# Patient Record
Sex: Female | Born: 1950 | Race: White | Hispanic: No | Smoking: Never smoker
Health system: Southern US, Community
[De-identification: ages and names within clinical notes are randomized; demographics above are authoritative.]

## PROBLEM LIST (undated history)

## (undated) DIAGNOSIS — G250 Essential tremor: Secondary | ICD-10-CM

## (undated) DIAGNOSIS — R251 Tremor, unspecified: Secondary | ICD-10-CM

## (undated) DIAGNOSIS — K921 Melena: Secondary | ICD-10-CM

## (undated) DIAGNOSIS — Z860100 Personal history of colon polyps, unspecified: Secondary | ICD-10-CM

## (undated) DIAGNOSIS — R079 Chest pain, unspecified: Secondary | ICD-10-CM

## (undated) DIAGNOSIS — D168 Benign neoplasm of pelvic bones, sacrum and coccyx: Secondary | ICD-10-CM

## (undated) DIAGNOSIS — E1140 Type 2 diabetes mellitus with diabetic neuropathy, unspecified: Secondary | ICD-10-CM

## (undated) DIAGNOSIS — K219 Gastro-esophageal reflux disease without esophagitis: Secondary | ICD-10-CM

## (undated) DIAGNOSIS — E785 Hyperlipidemia, unspecified: Secondary | ICD-10-CM

## (undated) DIAGNOSIS — G4730 Sleep apnea, unspecified: Secondary | ICD-10-CM

## (undated) DIAGNOSIS — I10 Essential (primary) hypertension: Secondary | ICD-10-CM

## (undated) DIAGNOSIS — T7840XA Allergy, unspecified, initial encounter: Secondary | ICD-10-CM

## (undated) DIAGNOSIS — E669 Obesity, unspecified: Secondary | ICD-10-CM

## (undated) HISTORY — DX: Personal history of colonic polyps: Z86.010

## (undated) HISTORY — DX: Morbid (severe) obesity due to excess calories: E66.01

## (undated) HISTORY — DX: Chest pain, unspecified: R07.9

## (undated) HISTORY — DX: Allergy, unspecified, initial encounter: T78.40XA

## (undated) HISTORY — DX: Tremor, unspecified: R25.1

## (undated) HISTORY — DX: Melena: K92.1

## (undated) HISTORY — DX: Benign neoplasm of pelvic bones, sacrum and coccyx: D16.8

## (undated) HISTORY — DX: Essential (primary) hypertension: I10

## (undated) HISTORY — DX: Sleep apnea, unspecified: G47.30

## (undated) HISTORY — DX: Hyperlipidemia, unspecified: E78.5

## (undated) HISTORY — DX: Gastro-esophageal reflux disease without esophagitis: K21.9

## (undated) HISTORY — DX: Personal history of colon polyps, unspecified: Z86.0100

---
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
## 1986-04-18 HISTORY — PX: OVARY BIOPSY: SHX2143

## 1986-04-18 HISTORY — PX: OTHER SURGICAL HISTORY: SHX169

## 1986-04-18 HISTORY — PX: ABDOMINAL HYSTERECTOMY: SHX81

## 2007-04-19 LAB — HM COLONOSCOPY

## 2009-02-06 ENCOUNTER — Ambulatory Visit: Payer: Self-pay | Admitting: Internal Medicine

## 2010-07-27 ENCOUNTER — Encounter: Payer: Self-pay | Admitting: Family Medicine

## 2010-07-27 ENCOUNTER — Ambulatory Visit (INDEPENDENT_AMBULATORY_CARE_PROVIDER_SITE_OTHER): Payer: 59 | Admitting: Family Medicine

## 2010-07-27 DIAGNOSIS — I10 Essential (primary) hypertension: Secondary | ICD-10-CM

## 2010-07-27 DIAGNOSIS — E7800 Pure hypercholesterolemia, unspecified: Secondary | ICD-10-CM | POA: Insufficient documentation

## 2010-07-27 DIAGNOSIS — E119 Type 2 diabetes mellitus without complications: Secondary | ICD-10-CM

## 2010-07-27 MED ORDER — METFORMIN HCL 1000 MG PO TABS: 1000.0000 mg | ORAL_TABLET | Freq: Two times a day (BID) | ORAL | Status: DC

## 2010-07-27 MED ORDER — ENALAPRIL MALEATE 20 MG PO TABS: 20.0000 mg | ORAL_TABLET | Freq: Every day | ORAL | Status: DC

## 2010-07-27 NOTE — Assessment & Plan Note (Signed)
Check labs 

## 2010-07-27 NOTE — Progress Notes (Signed)
New patient.  Requesting records.   DM: "My sugar is up and that makes me feel bad ("tired")."  Occ dry mouth, not now.  Sugar has been ~300 in AM last few days.  Off all meds except for ASA and prilosec.  Hypertension:  Off meds.  occ lightheaded. Chest pain with exertion:no Edema:no Short of breath:no  PMH and SH reviewed  ROS: See HPI, otherwise noncontributory.  Meds, vitals, and allergies reviewed.   GEN: nad, alert and oriented HEENT: mucous membranes moist NECK: supple w/o LA CV: rrr.  no murmur PULM: ctab, no inc wob ABD: soft, +bs EXT: no edema SKIN: no acute rash Head tremor noted.    Diabetic foot exam: Normal inspection No skin breakdown No calluses  Normal DP pulses Normal sensation to light tough and monofilament Nails normal

## 2010-07-27 NOTE — Assessment & Plan Note (Addendum)
Check labs and start back on metformin.  D/w pt re:med and weight.  Eat right diet given to patient.  Handwritten order for cmet/lipid/A1c/MALB given to patient.

## 2010-07-27 NOTE — Patient Instructions (Signed)
Schedule a follow up appointment in 2 weeks.  OV. Get your labs done at [facility]. Start back on 1 enalapril a day. Take 1 metformin a day for 1 week and then increase to 1 pill twice a day. Take care.

## 2010-07-27 NOTE — Assessment & Plan Note (Signed)
Start back on ACE and check labs.  Return for recheck in 2 weeks.

## 2010-07-29 LAB — LIPID PANEL
Cholesterol: 233 mg/dL — AB (ref 0–200)
Triglycerides: 192 mg/dL — AB (ref 40–160)

## 2010-07-29 LAB — HEMOGLOBIN A1C: Hgb A1c MFr Bld: 14.1 % — AB (ref 4.0–6.0)

## 2010-07-29 LAB — BASIC METABOLIC PANEL
BUN: 10 mg/dL (ref 4–21)
Creatinine: 0.6 mg/dL (ref <=1.1)
Sodium: 141 mmol/L (ref 137–147)

## 2010-07-29 LAB — HEPATIC FUNCTION PANEL: Bilirubin, Total: 0.4 mg/dL

## 2010-08-02 ENCOUNTER — Encounter: Payer: Self-pay | Admitting: Family Medicine

## 2010-08-04 ENCOUNTER — Encounter: Payer: Self-pay | Admitting: Family Medicine

## 2010-08-09 ENCOUNTER — Encounter: Payer: Self-pay | Admitting: Family Medicine

## 2010-08-09 DIAGNOSIS — K5790 Diverticulosis of intestine, part unspecified, without perforation or abscess without bleeding: Secondary | ICD-10-CM | POA: Insufficient documentation

## 2010-08-10 ENCOUNTER — Encounter: Payer: Self-pay | Admitting: Family Medicine

## 2010-08-10 ENCOUNTER — Ambulatory Visit (INDEPENDENT_AMBULATORY_CARE_PROVIDER_SITE_OTHER): Payer: 59 | Admitting: Family Medicine

## 2010-08-10 DIAGNOSIS — I10 Essential (primary) hypertension: Secondary | ICD-10-CM

## 2010-08-10 DIAGNOSIS — N760 Acute vaginitis: Secondary | ICD-10-CM | POA: Insufficient documentation

## 2010-08-10 DIAGNOSIS — E119 Type 2 diabetes mellitus without complications: Secondary | ICD-10-CM

## 2010-08-10 MED ORDER — FLUCONAZOLE 150 MG PO TABS: ORAL_TABLET | ORAL | Status: DC

## 2010-08-10 MED ORDER — INSULIN GLARGINE 100 UNIT/ML ~~LOC~~ SOLN: SUBCUTANEOUS | Status: DC

## 2010-08-10 MED ORDER — INSULIN PEN NEEDLE 31G X 8 MM MISC: 1.0000 | Freq: Every day | Status: DC

## 2010-08-10 NOTE — Assessment & Plan Note (Addendum)
Refer for teaching and start lantus.  Return in 3 months.  D/w pt about diet and gradual titration of lantus.  She understood based on her husband's use.  Handwritten order for A1c at [facility] in 3 months.

## 2010-08-10 NOTE — Assessment & Plan Note (Signed)
Use diflucan and fu prn.

## 2010-08-10 NOTE — Patient Instructions (Signed)
See Shirlee Limerick about your referral before your leave today. Take the insulin to the diabetes education class.  Schedule a follow up appointment for labs in 3 month with a 30 min visit a few days later.  Take the diflucan today and repeat in 1 week if needed.

## 2010-08-10 NOTE — Assessment & Plan Note (Signed)
Improved, continue ACE.

## 2010-08-10 NOTE — Progress Notes (Signed)
"  I feel some better."   Vaginitis with likely yeast; discharge and burning.    Hypertension:    Using medication without problems or lightheadedness: yes Chest pain with exertion:no Edema:no Short of breath:no  Diabetes:  Using medications without difficulties:yes, not lightheaded now, less dry mouth Hypoglycemic episodes: no Hyperglycemic episodes: yes Blood Sugars averaging:284 this AM fasting Labs reviewed with patient. Initial GI upset with metformin resolved.    PMH and SH noted.  Old records have been reviewed and sent for scanning.   Meds, vitals, and allergies reviewed.   ROS: See HPI.  Otherwise negative.    GEN: nad, alert and oriented HEENT: mucous membranes moist NECK: supple w/o LA CV: rrr. PULM: ctab, no inc wob ABD: soft, +bs EXT: no edema SKIN: no acute rash

## 2010-08-13 ENCOUNTER — Encounter: Payer: Self-pay | Admitting: Family Medicine

## 2010-08-16 ENCOUNTER — Ambulatory Visit: Payer: Self-pay | Admitting: Family Medicine

## 2010-08-17 ENCOUNTER — Ambulatory Visit: Payer: Self-pay | Admitting: Family Medicine

## 2010-09-09 ENCOUNTER — Other Ambulatory Visit: Payer: Self-pay | Admitting: *Deleted

## 2010-09-09 MED ORDER — ENALAPRIL MALEATE 20 MG PO TABS: 20.0000 mg | ORAL_TABLET | Freq: Every day | ORAL | Status: DC

## 2010-09-09 MED ORDER — INSULIN GLARGINE 100 UNIT/ML ~~LOC~~ SOLN: SUBCUTANEOUS | Status: DC

## 2010-09-09 MED ORDER — METFORMIN HCL 1000 MG PO TABS: 1000.0000 mg | ORAL_TABLET | Freq: Two times a day (BID) | ORAL | Status: DC

## 2010-09-09 NOTE — Telephone Encounter (Signed)
Patient requested refills be sent to [facility].  Rx's sent.

## 2010-09-17 ENCOUNTER — Ambulatory Visit: Payer: Self-pay | Admitting: Family Medicine

## 2010-09-21 ENCOUNTER — Encounter: Payer: Self-pay | Admitting: Family Medicine

## 2010-09-21 ENCOUNTER — Ambulatory Visit (INDEPENDENT_AMBULATORY_CARE_PROVIDER_SITE_OTHER): Payer: 59 | Admitting: Family Medicine

## 2010-09-21 VITALS — BP 120/80 | HR 72 | Temp 98.8°F | Wt 190.2 lb

## 2010-09-21 DIAGNOSIS — M25519 Pain in unspecified shoulder: Secondary | ICD-10-CM

## 2010-09-21 DIAGNOSIS — M25512 Pain in left shoulder: Secondary | ICD-10-CM

## 2010-09-21 MED ORDER — HYDROCODONE-ACETAMINOPHEN 5-500 MG PO TABS: 1.0000 | ORAL_TABLET | Freq: Three times a day (TID) | ORAL | Status: AC | PRN

## 2010-09-21 MED ORDER — HYDROCODONE-ACETAMINOPHEN 5-500 MG PO TABS: 1.0000 | ORAL_TABLET | Freq: Three times a day (TID) | ORAL | Status: DC | PRN

## 2010-09-21 NOTE — Assessment & Plan Note (Addendum)
D/w pt about rom exercises.  She understood.  I would not inject given her DM2.  I would refer to ortho.  D/w pt about anatomy of cuff pathology and she understood.  The vicodin rx wouldn't print so I hand wrote with the identical sig, amount, no Rf.

## 2010-09-21 NOTE — Progress Notes (Signed)
L arm pain.  Dull ache that progressed over last few weeks.  Pain with working overhead. ROM limited to 90 deg abduction.  Pain from shoulder to elbow. Painful sleeping on L side.  Tried sleeping in recliner.  Grip is okay.  No R sided sx.    Sugar has been 100-120 with titration on lantus, now at 52 units.   nad but uncomfortable due to pain in L shoulder Normal rom in the neck Shoulder w/o bruising, skin wnl L shoulder with limited rom, 90deg for abduction and forward flexion. +impingement and pain on testing supraspinatus.   L AC not ttp Distally nv intact

## 2010-09-21 NOTE — Patient Instructions (Signed)
Keep moving your arm- 'stir the pot' and 'snowangels' on the bed.  Shirlee Limerick will be in touch about your referral.  Take the pain meds- it can make you drowsy.  Take care.

## 2010-10-05 ENCOUNTER — Encounter: Payer: Self-pay | Admitting: Family Medicine

## 2010-10-17 ENCOUNTER — Ambulatory Visit: Payer: Self-pay | Admitting: Family Medicine

## 2010-11-02 ENCOUNTER — Encounter: Payer: Self-pay | Admitting: Family Medicine

## 2010-11-08 ENCOUNTER — Encounter: Payer: Self-pay | Admitting: Family Medicine

## 2010-11-11 ENCOUNTER — Encounter: Payer: Self-pay | Admitting: Family Medicine

## 2010-11-11 ENCOUNTER — Ambulatory Visit (INDEPENDENT_AMBULATORY_CARE_PROVIDER_SITE_OTHER): Payer: 59 | Admitting: Family Medicine

## 2010-11-11 VITALS — BP 118/80 | HR 92 | Temp 98.3°F | Ht 63.0 in | Wt 195.0 lb

## 2010-11-11 DIAGNOSIS — E1140 Type 2 diabetes mellitus with diabetic neuropathy, unspecified: Secondary | ICD-10-CM | POA: Insufficient documentation

## 2010-11-11 DIAGNOSIS — M25519 Pain in unspecified shoulder: Secondary | ICD-10-CM

## 2010-11-11 DIAGNOSIS — M25512 Pain in left shoulder: Secondary | ICD-10-CM

## 2010-11-11 DIAGNOSIS — E1142 Type 2 diabetes mellitus with diabetic polyneuropathy: Secondary | ICD-10-CM

## 2010-11-11 DIAGNOSIS — E1149 Type 2 diabetes mellitus with other diabetic neurological complication: Secondary | ICD-10-CM

## 2010-11-11 DIAGNOSIS — E119 Type 2 diabetes mellitus without complications: Secondary | ICD-10-CM

## 2010-11-11 MED ORDER — INSULIN PEN NEEDLE 31G X 8 MM MISC: 1.0000 | Freq: Every day | Status: DC

## 2010-11-11 MED ORDER — INSULIN GLARGINE 100 UNIT/ML ~~LOC~~ SOLN: 52.0000 [IU] | Freq: Every day | SUBCUTANEOUS | Status: DC

## 2010-11-11 MED ORDER — GLUCOSE BLOOD VI STRP: ORAL_STRIP | Status: DC

## 2010-11-11 NOTE — Progress Notes (Signed)
Diabetes:  Using medications without difficulties:yes Hypoglycemic episodes:no Hyperglycemic episodes:no Feet problems: some numbness, no change from before.   Blood Sugars averaging: ~90 in AM She's working on diet and weight.  She's going to work on walking.    Shoulder pain.  She saw Dr. Penni Bombard, went to PT. Was injected.  Still with pain, but improved.  Still in PT.  Has f/u with Penni Bombard pending.  She's taking vicodin and meloxicam.  She has trouble sleeping at night due to pain.   Asking about coming off the vicodin.    PMH and SH reviewed  Meds, vitals, and allergies reviewed.   ROS: See HPI.  Otherwise negative.    GEN: nad, alert and oriented HEENT: mucous membranes moist NECK: supple w/o LA CV: rrr. PULM: ctab, no inc wob ABD: soft, +bs EXT: no edema SKIN: no acute rash  Diabetic foot exam: Normal inspection No skin breakdown No calluses  Normal DP pulses Dec sensation to light touch and monofilament Nails normal

## 2010-11-11 NOTE — Assessment & Plan Note (Signed)
Stop vicodin and use plain tylenol.  Call back as needed.  Appreciate ortho help.

## 2010-11-11 NOTE — Patient Instructions (Signed)
Take 1-2 tylenol (500mg  each) at night for pain.  See is this helps.  Keep going with PT and talk to Dr. Penni Bombard. Keep going with the lantus- adjust the dose by 1 unit per day if your sugar is high or low.  Try to gradually inc your walking.   I would recheck your A1c in 3-4 months with an OV a few days after that.  Take care.

## 2010-11-11 NOTE — Assessment & Plan Note (Addendum)
Improved but not at goal.  Continue to work on diet, titrate lantus based on AM glucose and exercise more.  Recheck A1c in 3 months.  She agrees. >25 min spent with face to face with patient, >50% counseling.

## 2011-02-07 ENCOUNTER — Encounter: Payer: Self-pay | Admitting: Family Medicine

## 2011-02-15 ENCOUNTER — Ambulatory Visit (INDEPENDENT_AMBULATORY_CARE_PROVIDER_SITE_OTHER): Payer: 59 | Admitting: Family Medicine

## 2011-02-15 ENCOUNTER — Encounter: Payer: Self-pay | Admitting: Family Medicine

## 2011-02-15 VITALS — BP 156/88 | HR 101 | Temp 98.3°F | Wt 206.0 lb

## 2011-02-15 DIAGNOSIS — Z23 Encounter for immunization: Secondary | ICD-10-CM

## 2011-02-15 DIAGNOSIS — E1142 Type 2 diabetes mellitus with diabetic polyneuropathy: Secondary | ICD-10-CM

## 2011-02-15 DIAGNOSIS — E1149 Type 2 diabetes mellitus with other diabetic neurological complication: Secondary | ICD-10-CM

## 2011-02-15 DIAGNOSIS — G5710 Meralgia paresthetica, unspecified lower limb: Secondary | ICD-10-CM | POA: Insufficient documentation

## 2011-02-15 NOTE — Patient Instructions (Signed)
Check labs in 07/2011- cmet/lipid/A1c- at [facility] and schedule a physical after the labs (ie late April or early May 2013). Work on [employer] and walk more.  Take care and call me with concerns. Glad to see you.

## 2011-02-15 NOTE — Progress Notes (Signed)
Diabetes:  Using medications without difficulties:yes Hypoglycemic episodes:no Hyperglycemic episodes:no Feet problems:no Blood Sugars averaging: 100-120 A1c 7.2 on last check.  Some pain/numbness in R leg, lateral thigh.  Worse sitting.  In distribution on lateral femoral cutaneous nerve.  No weakness in leg. She adjusting her lantus daily as needed. She's trying to work on diet and exercise. She has some exercise equipment at home and is thinking about a walking routine. She's doing exercise at home for shoulder and doing some better.  She's like a flu shot today.  Recheck BP 130/90.  BP controlled on outside checks.   PMH and SH reviewed  Meds, vitals, and allergies reviewed.   ROS: See HPI.  Otherwise negative.    GEN: nad, alert and oriented HEENT: mucous membranes moist NECK: supple w/o LA CV: rrr. PULM: ctab, no inc wob ABD: soft, +bs EXT: no edema SKIN: no acute rash Skin sensitive in distal R LFCN distribution but no weakness.   Diabetic foot exam: Normal inspection No skin breakdown No calluses  Normal DP pulses Mild dec in sensation to monofilament Nails normal

## 2011-02-15 NOTE — Assessment & Plan Note (Signed)
Limit prolonged sitting, stretch, and make sure arm rests aren't pressing on leg/thigh at work while sitting. No motor deficit. D/w pt.

## 2011-02-15 NOTE — Assessment & Plan Note (Signed)
Continue current meds.  Labs d/w pt.  Needs more exercise and work on diet.  Recheck in 6 months.  She agrees. Flu shot today. >25 min spent with face to face with patient, >50% counseling and/or coordinating care.

## 2011-07-29 ENCOUNTER — Encounter: Payer: 59 | Admitting: Family Medicine

## 2011-10-12 ENCOUNTER — Telehealth: Payer: Self-pay

## 2011-10-12 NOTE — Telephone Encounter (Signed)
Handwritten, please fax

## 2011-10-12 NOTE — Telephone Encounter (Signed)
Pt request lab order for Cmet,lipid, A1C faxed to [facility] fax # [phone].Please advise. Pt has CPX 10/18/11.

## 2011-10-13 NOTE — Telephone Encounter (Signed)
Faxed to number listed below.

## 2011-10-18 ENCOUNTER — Encounter: Payer: Self-pay | Admitting: Family Medicine

## 2011-10-18 ENCOUNTER — Ambulatory Visit (INDEPENDENT_AMBULATORY_CARE_PROVIDER_SITE_OTHER): Payer: 59 | Admitting: Family Medicine

## 2011-10-18 VITALS — BP 142/84 | HR 96 | Temp 98.7°F | Ht 64.0 in | Wt 211.0 lb

## 2011-10-18 DIAGNOSIS — Z1211 Encounter for screening for malignant neoplasm of colon: Secondary | ICD-10-CM

## 2011-10-18 DIAGNOSIS — E1142 Type 2 diabetes mellitus with diabetic polyneuropathy: Secondary | ICD-10-CM

## 2011-10-18 DIAGNOSIS — N760 Acute vaginitis: Secondary | ICD-10-CM

## 2011-10-18 DIAGNOSIS — Z800 Family history of malignant neoplasm of digestive organs: Secondary | ICD-10-CM | POA: Insufficient documentation

## 2011-10-18 DIAGNOSIS — E1149 Type 2 diabetes mellitus with other diabetic neurological complication: Secondary | ICD-10-CM

## 2011-10-18 MED ORDER — FLUCONAZOLE 150 MG PO TABS: 150.0000 mg | ORAL_TABLET | Freq: Once | ORAL | Status: DC

## 2011-10-18 NOTE — Assessment & Plan Note (Signed)
Uncontrolled, start back on insulin and DM2 diet. Recheck in 3 months, written order for A1c at [facility].  D/w pt.  She agrees.

## 2011-10-18 NOTE — Assessment & Plan Note (Signed)
Restart diflucan, needs to control sugar.

## 2011-10-18 NOTE — Assessment & Plan Note (Signed)
Refer to GI 

## 2011-10-18 NOTE — Progress Notes (Signed)
Was due for CPE but this was tabled due to DM2 concerns.    Diabetes:  Ran out of insulin.  Continued other meds. She just got a refill on insulin.  She is able to afford it with he spending account.   Hypoglycemic episodes: no Hyperglycemic episodes: yes Feet problems: some tingling and numbness, increased recently Blood Sugars averaging: was 398 on fasting labs A1c 11.8., prev was 7.2.  Schedule was disrupted caring for family, her parents were ill and her sister in law died.  Father likely has early dementia and mother had a broken hip.  Also with likely yeast infection as sugar has been up recently.  Itching in groin.   FH colon cancer and has some BRBPR recently.  Last colonoscopy in 2009 in [place].   Meds, vitals, and allergies reviewed.   ROS: See HPI.  Otherwise negative.    GEN: nad, alert and oriented, tremor at baseline NECK: supple w/o LA CV: rrr. PULM: ctab, no inc wob ABD: soft, +bs EXT: no edema SKIN: no acute rash  Diabetic foot exam: Normal inspection No skin breakdown No calluses  Normal DP pulses Dec sensation to light touch and monofilament bilaterally Nails normal

## 2011-10-18 NOTE — Patient Instructions (Addendum)
See Asher Muir about your referral before you leave today. Start back on your diet and recheck A1c in 3 months.

## 2011-10-25 ENCOUNTER — Encounter: Payer: Self-pay | Admitting: Family Medicine

## 2011-10-26 ENCOUNTER — Telehealth: Payer: Self-pay

## 2011-10-26 MED ORDER — INSULIN GLARGINE 100 UNIT/ML ~~LOC~~ SOLN: 52.0000 [IU] | Freq: Every day | SUBCUTANEOUS | Status: DC

## 2011-10-26 NOTE — Telephone Encounter (Signed)
Sent. Thanks.   

## 2011-10-26 NOTE — Telephone Encounter (Signed)
Pt called requesting Lantus Solostar pen; needs 30 day prescription sent to [facility]. Lantus solostar not on med list; lantus vial is on med list. Pt injects 52 units daily. Pt does not need pen needles.PT IS OUT OF MED AND NEEDS SENT IN TODAY. Pt will ck with pharmacy later this evening to pick up med.

## 2011-10-28 ENCOUNTER — Other Ambulatory Visit: Payer: Self-pay | Admitting: *Deleted

## 2011-10-28 MED ORDER — METFORMIN HCL 1000 MG PO TABS: 1000.0000 mg | ORAL_TABLET | Freq: Two times a day (BID) | ORAL | Status: DC

## 2011-10-28 MED ORDER — INSULIN GLARGINE 100 UNIT/ML ~~LOC~~ SOLN: 52.0000 [IU] | Freq: Every day | SUBCUTANEOUS | Status: DC

## 2011-10-28 MED ORDER — ENALAPRIL MALEATE 20 MG PO TABS: 20.0000 mg | ORAL_TABLET | Freq: Every day | ORAL | Status: DC

## 2011-11-15 ENCOUNTER — Encounter: Payer: Self-pay | Admitting: Internal Medicine

## 2011-11-15 ENCOUNTER — Ambulatory Visit (INDEPENDENT_AMBULATORY_CARE_PROVIDER_SITE_OTHER): Payer: 59 | Admitting: Internal Medicine

## 2011-11-15 VITALS — BP 128/70 | HR 96 | Ht 63.0 in | Wt 217.0 lb

## 2011-11-15 DIAGNOSIS — K625 Hemorrhage of anus and rectum: Secondary | ICD-10-CM

## 2011-11-15 DIAGNOSIS — Z86010 Personal history of colonic polyps: Secondary | ICD-10-CM

## 2011-11-15 DIAGNOSIS — R1310 Dysphagia, unspecified: Secondary | ICD-10-CM

## 2011-11-15 DIAGNOSIS — E119 Type 2 diabetes mellitus without complications: Secondary | ICD-10-CM

## 2011-11-15 DIAGNOSIS — K219 Gastro-esophageal reflux disease without esophagitis: Secondary | ICD-10-CM

## 2011-11-15 MED ORDER — MOVIPREP 100 G PO SOLR: 1.0000 | Freq: Once | ORAL | Status: DC

## 2011-11-15 NOTE — Progress Notes (Signed)
HISTORY OF PRESENT ILLNESS:  Alexis [name] is a 61 y.o. female with hypertension, insulin requiring diabetes mellitus, obesity, GERD, and adenomatous colon polyps. She presents today, at the request of Dr. Para [name], regarding rectal bleeding. Patient reports that she passed a painful bowel movement in mid June. Thereafter she noted bright red blood on the toilet tissue and water. Problems with discomfort and bleeding persisted for 4-5 days. Problem resolved only to return the following week. Currently she reports rectal discomfort with defecation but no bleeding. She has a family history of colon cancer in her father at age 2. She has a personal history of colon polyps. Review of outside records from the [facility] at [place] from April 2009 she'll complete colonoscopy. This revealed diverticulosis as well as 2 polyps which were removed. One was an adenoma the other hyperplastic. Followup in 5 years recommended. Patient denies, pain or weight loss. Next, she reports a greater than 10 year history of GERD. For this she takes Prilosec daily. Without medication significant symptoms. On medication, occasional breakthrough. She also complains of intermittent solid food dysphagia several times per week. She has had endoscopy, but this was remote (greater than 10 years). For diabetes she takes insulin and Glucophage. Diabetes had been poorly controlled until recently after she resumed appropriate medical therapy.  REVIEW OF SYSTEMS:  All non-GI ROS negative except for cough and urinary leakage  Past Medical History  Diagnosis Date  . GERD (gastroesophageal reflux disease)   . Allergy   . Hyperlipidemia   . Hx of colonic polyps   . Blood in stool   . Asthma     in childhood  . Diabetes mellitus     dx'd at age ~31  . Hypertension     dx'd at age ~7  . Tremor     only in face; worse under periods of stress. father with tremor also    Past Surgical History  Procedure Date  .  Tonsillectomy and adenoidectomy 1957  . Abdominal hysterectomy 1988    Surgery included removal of benign pelvic tumor    Social History Alexis [name]  reports that she has never smoked. She has never used smokeless tobacco. She reports that she does not drink alcohol or use illicit drugs.  family history includes Colon cancer in her father and sister; Heart disease in her mother; Lung cancer in her maternal grandfather; and Uterine cancer in her sister.  No Known Allergies     PHYSICAL EXAMINATION: Vital signs: BP 128/70  Pulse 96  Ht 5\' 3"  (1.6 m)  Wt 217 lb (98.431 kg)  BMI 38.44 kg/m2  Constitutional: obese,generally well-appearing, no acute distress Psychiatric: alert and oriented x3, cooperative Eyes: extraocular movements intact, anicteric, conjunctiva pink Mouth: oral pharynx moist, no lesions Neck: supple no lymphadenopathy Cardiovascular: heart regular rate and rhythm, no murmur Lungs: clear to auscultation bilaterally Abdomen: soft,obese, nontender, nondistended, no obvious ascites, no peritoneal signs, normal bowel sounds, no organomegaly Rectal:deferred until colonoscopy Extremities: no lower extremity edema bilaterally Skin: no lesions on visible extremities Neuro: No focal deficits.   ASSESSMENT:  #1. Rectal discomfort and bleeding as described. Suspect anal fissure #2. Personal history of adenomatous colon polyps. Last colonoscopy about 5 years ago #3. Family history of colon cancer in her father around age 55 #4. Chronic GERD requiring PPI therapy. Some breakthrough symptoms. #5. Intermittent solid food dysphagia. Rule out stricture   PLAN:  #1. Surveillance colonoscopy.The nature of the procedure, as well as the risks,  benefits, and alternatives were carefully and thoroughly reviewed with the patient. Ample time for discussion and questions allowed. The patient understood, was satisfied, and agreed to proceed.  #2. Movi prep prescribed. The patient  instructed on its use #3. Hold diabetic medications the morning of the procedure. Monitor the patient's blood sugar the day of the procedure immediately before and after the examinations #4. Reflux precautions #5. Continue PPI #6. Upper endoscopy with possible esophageal dilation.The nature of the procedure, as well as the risks, benefits, and alternatives were carefully and thoroughly reviewed with the patient. Ample time for discussion and questions allowed. The patient understood, was satisfied, and agreed to proceed.  #7. If anal fissure identified, as suspected, medical therapy to follow

## 2011-11-15 NOTE — Patient Instructions (Addendum)
You have been scheduled for an endoscopy and colonoscopy with propofol. Please follow the written instructions given to you at your visit today. Please pick up your prep at the pharmacy within the next 1-3 days. If you use inhalers (even only as needed), please bring them with you on the day of your procedure.    

## 2011-12-27 ENCOUNTER — Ambulatory Visit (AMBULATORY_SURGERY_CENTER): Payer: 59 | Admitting: Internal Medicine

## 2011-12-27 ENCOUNTER — Encounter: Payer: Self-pay | Admitting: Internal Medicine

## 2011-12-27 VITALS — BP 127/63 | HR 97 | Temp 98.6°F | Resp 22 | Ht 63.0 in | Wt 217.0 lb

## 2011-12-27 DIAGNOSIS — D126 Benign neoplasm of colon, unspecified: Secondary | ICD-10-CM

## 2011-12-27 DIAGNOSIS — K625 Hemorrhage of anus and rectum: Secondary | ICD-10-CM

## 2011-12-27 DIAGNOSIS — Z86010 Personal history of colonic polyps: Secondary | ICD-10-CM

## 2011-12-27 DIAGNOSIS — Z800 Family history of malignant neoplasm of digestive organs: Secondary | ICD-10-CM

## 2011-12-27 DIAGNOSIS — K219 Gastro-esophageal reflux disease without esophagitis: Secondary | ICD-10-CM

## 2011-12-27 DIAGNOSIS — K222 Esophageal obstruction: Secondary | ICD-10-CM

## 2011-12-27 DIAGNOSIS — Z1211 Encounter for screening for malignant neoplasm of colon: Secondary | ICD-10-CM

## 2011-12-27 DIAGNOSIS — R1310 Dysphagia, unspecified: Secondary | ICD-10-CM

## 2011-12-27 MED ORDER — SODIUM CHLORIDE 0.9 % IV SOLN: 500.0000 mL | INTRAVENOUS | Status: DC

## 2011-12-27 NOTE — Progress Notes (Signed)
Patient did not experience any of the following events: a burn prior to discharge; a fall within the facility; wrong site/side/patient/procedure/implant event; or a hospital transfer or hospital admission upon discharge from the facility. (G8907) Patient did not have preoperative order for IV antibiotic SSI prophylaxis. (G8918)  

## 2011-12-27 NOTE — Op Note (Signed)
 [facility] [address] [place], [address]   COLONOSCOPY PROCEDURE REPORT  PATIENT: Alexis, [name]  MR#: [id number] BIRTHDATE: 1950-08-22 , 61  yrs. old GENDER: Female ENDOSCOPIST: Roxy Cedar, MD REFERRED XB:JYNWGN Duncan, M.D. PROCEDURE DATE:  12/27/2011 PROCEDURE:   Colonoscopy with snare polypectomy    x 3 ASA CLASS:   Class II INDICATIONS:patient's immediate family history of colon cancer (father and sister), patient's personal history of adenomatous colon polyps ([facility] [place] 07-2007), and minor self limited rectal bleeding. MEDICATIONS: MAC sedation, administered by CRNA and propofol (Diprivan) 150mg  IV  DESCRIPTION OF PROCEDURE:   After the risks benefits and alternatives of the procedure were thoroughly explained, informed consent was obtained.  A digital rectal exam revealed no abnormalities of the rectum.   The LB CF-H180AL [id number]  endoscope was introduced through the anus and advanced to the cecum, which was identified by both the appendix and ileocecal valve. No adverse events experienced.   The quality of the prep was excellent, using MoviPrep  The instrument was then slowly withdrawn as the colon was fully examined.      COLON FINDINGS: A sessile polyp measuring 9 mm in size was found in the ascending colon.  A polypectomy was performed using snare cautery.  The resection was complete and the polyp tissue was completely retrieved.   A sessile polyp measuring 10 mm in size was found at the hepatic flexure.  A polypectomy was performed using snare cautery.  The resection was complete and the polyp tissue was completely retrieved.   A single polyp measuring 5 mm in size was found in the transverse colon.  A polypectomy was performed with a cold snare.  The resection was complete and the polyp tissue was completely retrieved.   Moderate diverticulosis was noted in the sigmoid colon.  Retroflexed views revealed no abnormalities. The time  to cecum=3 minutes 38 seconds.  Withdrawal time=15 minutes 26 seconds.  The scope was withdrawn and the procedure completed. COMPLICATIONS: There were no complications.  ENDOSCOPIC IMPRESSION: 1.   Sessile polyp measuring 9 mm in size was found in the ascending colon; polypectomy was performed using snare cautery 2.   Sessile polyp measuring 10 mm in size was found at the hepatic flexure; polypectomy was performed using snare cautery 3.   Single polyp measuring 5 mm in size was found in the transverse colon; polypectomy was performed with a cold snare 4.   Moderate diverticulosis was noted in the sigmoid colon  RECOMMENDATIONS: 1. repeat Colonoscopy in 3 years.  eSigned:  Roxy Cedar, MD 12/27/2011 2:24 PMrecc: Crawford Givens, MD and The Patient   PATIENT NAME:  Alexis, [name] MR#: [id number]

## 2011-12-27 NOTE — Op Note (Signed)
[facility] [address] [place], [address]   ENDOSCOPY PROCEDURE REPORT  PATIENT: Alexis, [name]  MR#: [id number] BIRTHDATE: 12/14/1950 , 61  yrs. old GENDER: Female ENDOSCOPIST: Roxy Cedar, MD REFERRED BY:  Crawford Givens, M.D. PROCEDURE DATE:  12/27/2011 PROCEDURE:  EGD, diagnostic and Maloney dilation of esophagus - 52F ASA CLASS:     Class II INDICATIONS:  dysphagia. MEDICATIONS: MAC sedation, administered by CRNA and propofol (Diprivan) 220mg  IV TOPICAL ANESTHETIC: Cetacaine Spray  DESCRIPTION OF PROCEDURE: After the risks benefits and alternatives of the procedure were thoroughly explained, informed consent was obtained.  The LB GIF-H180 [id number] endoscope was introduced through the mouth and advanced to the second portion of the duodenum. Without limitations.  The instrument was slowly withdrawn as the mucosa was fully examined.      A large caliber ring-like  stricture was found in the distal esophagus.   The remainder of the upper endoscopy exam was otherwise normal to D2.  Retroflexed views revealed no abnormalities.     The scope was then withdrawn from the patient and the procedure completed.  THERAPY: 52F MALONEY DILATOR PASSED W/O RESISTANCE. HEME UPON REMOVAL. RELOOK EGD WITH MINOR PHARYNGEAL TRAUMA. NO ESOPHAGEAL TRAUMA. TOLEATED WELL.  COMPLICATIONS: There were no complications.  ENDOSCOPIC IMPRESSION: 1.   Stricture was found s/p dilation 2.   The remainder of the upper endoscopy exam was otherwise normal  RECOMMENDATIONS: 1.  Clear liquids until 5pm , then soft foods restiof day.  Resume prior diet tomorrow. 2.  Follow clinical response to dilatation.  e]  eSigned:  Roxy Cedar, MD 12/27/2011 2:32 PM ed  XB:JYNWGN Para March, MD and The Patient  PATIENT NAME:  Alexis, [name] MR#: [id number]

## 2011-12-27 NOTE — Patient Instructions (Addendum)
YOU HAD AN ENDOSCOPIC PROCEDURE TODAY AT THE [facility]: Refer to the procedure report that was given to you for any specific questions about what was found during the examination.  If the procedure report does not answer your questions, please call your gastroenterologist to clarify.  If you requested that your care partner not be given the details of your procedure findings, then the procedure report has been included in a sealed envelope for you to review at your convenience later.  YOU SHOULD EXPECT: Some feelings of bloating in the abdomen. Passage of more gas than usual.  Walking can help get rid of the air that was put into your GI tract during the procedure and reduce the bloating. If you had a lower endoscopy (such as a colonoscopy or flexible sigmoidoscopy) you may notice spotting of blood in your stool or on the toilet paper. If you underwent a bowel prep for your procedure, then you may not have a normal bowel movement for a few days.  DIET:Clear liquids until 5 pm then soft foods the rest of today. ACTIVITY: Your care partner should take you home directly after the procedure.  You should plan to take it easy, moving slowly for the rest of the day.  You can resume normal activity the day after the procedure however you should NOT DRIVE or use heavy machinery for 24 hours (because of the sedation medicines used during the test).    SYMPTOMS TO REPORT IMMEDIATELY: A gastroenterologist can be reached at any hour.  During normal business hours, 8:30 AM to 5:00 PM Monday through Friday, call [phone].  After hours and on weekends, please call the GI answering service at [phone] who will take a message and have the physician on call contact you.   Following lower endoscopy (colonoscopy or flexible sigmoidoscopy):  Excessive amounts of blood in the stool  Significant tenderness or worsening of abdominal pains  Swelling of the abdomen that is new, acute  Fever of 100F  or higher  Following upper endoscopy (EGD)  Vomiting of blood or coffee ground material  New chest pain or pain under the shoulder blades  Painful or persistently difficult swallowing  New shortness of breath  Fever of 100F or higher  Black, tarry-looking stools  FOLLOW UP: If any biopsies were taken you will be contacted by phone or by letter within the next 1-3 weeks.  Call your gastroenterologist if you have not heard about the biopsies in 3 weeks.  Our staff will call the home number listed on your records the next business day following your procedure to check on you and address any questions or concerns that you may have at that time regarding the information given to you following your procedure. This is a courtesy call and so if there is no answer at the home number and we have not heard from you through the emergency physician on call, we will assume that you have returned to your regular daily activities without incident.  SIGNATURES/CONFIDENTIALITY: You and/or your care partner have signed paperwork which will be entered into your electronic medical record.  These signatures attest to the fact that that the information above on your After Visit Summary has been reviewed and is understood.  Full responsibility of the confidentiality of this discharge information lies with you and/or your care-partner.

## 2011-12-28 ENCOUNTER — Telehealth: Payer: Self-pay | Admitting: *Deleted

## 2011-12-28 NOTE — Telephone Encounter (Signed)
  Follow up Call-  Call back number 12/27/2011  Post procedure Call Back phone  # [phone] or [phone]  Permission to leave phone message Yes     Patient questions:  Do you have a fever, pain , or abdominal swelling? no Pain Score  0 *  Have you tolerated food without any problems? yes  Have you been able to return to your normal activities? yes  Do you have any questions about your discharge instructions: Diet   no Medications  no Follow up visit  no  Do you have questions or concerns about your Care? no  Actions: * If pain score is 4 or above: No action needed, pain <4.

## 2012-01-02 ENCOUNTER — Other Ambulatory Visit: Payer: Self-pay

## 2012-01-02 ENCOUNTER — Encounter: Payer: Self-pay | Admitting: Internal Medicine

## 2012-01-02 DIAGNOSIS — Z800 Family history of malignant neoplasm of digestive organs: Secondary | ICD-10-CM

## 2012-01-02 MED ORDER — FLUCONAZOLE 150 MG PO TABS: 150.0000 mg | ORAL_TABLET | Freq: Once | ORAL | Status: AC

## 2012-01-02 NOTE — Telephone Encounter (Signed)
Pt has clear vaginal discharge with vaginal and perineal itching. Had same symptoms in July;symptoms cleared but have returned. Pt said she has not taken recent antibiotic; Pt said Dr Para March said if need refill on Fluconazole to call office and will send to [facility].Please advise.

## 2012-01-02 NOTE — Telephone Encounter (Signed)
Sent. F/u prn.  

## 2012-01-13 ENCOUNTER — Encounter: Payer: Self-pay | Admitting: Family Medicine

## 2012-01-18 ENCOUNTER — Encounter: Payer: Self-pay | Admitting: Family Medicine

## 2012-01-18 ENCOUNTER — Ambulatory Visit (INDEPENDENT_AMBULATORY_CARE_PROVIDER_SITE_OTHER): Payer: 59 | Admitting: Family Medicine

## 2012-01-18 VITALS — BP 124/76 | HR 88 | Temp 98.7°F | Wt 216.5 lb

## 2012-01-18 DIAGNOSIS — E1149 Type 2 diabetes mellitus with other diabetic neurological complication: Secondary | ICD-10-CM

## 2012-01-18 DIAGNOSIS — E1142 Type 2 diabetes mellitus with diabetic polyneuropathy: Secondary | ICD-10-CM

## 2012-01-18 DIAGNOSIS — Z1231 Encounter for screening mammogram for malignant neoplasm of breast: Secondary | ICD-10-CM

## 2012-01-18 MED ORDER — INSULIN GLARGINE 100 UNIT/ML ~~LOC~~ SOLN: 52.0000 [IU] | Freq: Every day | SUBCUTANEOUS | Status: DC

## 2012-01-18 NOTE — Progress Notes (Signed)
She's looking after 3 family members.  Her diet and and exercise have been affected.  She's trying to get back with a walking routine.  A1c is up to ~11 has been as low as ~7 or as high as 14 prev.  She's been compliant with lantus and has been able to get it filled. Compliant with other meds.  She is trying to lose weight and can walk with her sister, can walk at work.   Due for mammogram, will get flu shot at work.   Meds, vitals, and allergies reviewed.   ROS: See HPI.  Otherwise, noncontributory.  GEN: nad, alert and oriented, head tremor at baseline HEENT: mucous membranes moist NECK: supple w/o LA CV: rrr.  PULM: ctab, no inc wob ABD: soft, +bs EXT: no edema SKIN: no acute rash  Diabetic foot exam: Normal inspection No skin breakdown No calluses  Normal DP pulses Normal sensation to light touch and monofilament Nails normal

## 2012-01-18 NOTE — Assessment & Plan Note (Signed)
Sensation intact today- will follow clinically.  Titrate lantus and order written for recheck A1c in 3 months.  She agrees.  Will plan on f/u in 6 months here.

## 2012-01-18 NOTE — Patient Instructions (Addendum)
Recheck A1c in 3 months.  I would get a flu shot each fall (at work). See Shirlee Limerick about your referral before you leave today. Keep walking.   Plan on recheck in 6 months.   Check with your insurance to see if they will cover the shingles shot.

## 2012-01-25 ENCOUNTER — Encounter: Payer: Self-pay | Admitting: Family Medicine

## 2012-01-26 ENCOUNTER — Encounter: Payer: Self-pay | Admitting: *Deleted

## 2012-03-06 ENCOUNTER — Other Ambulatory Visit: Payer: Self-pay

## 2012-03-06 NOTE — Telephone Encounter (Signed)
Pt request refills on diabetic test strips,insulin pen needles and lantus solostar; pt presently taking 70 units daily of Lantus solostar for one month. Pt said FBS averaging 100 on 70 units daily. Pt not sure how many pens she needs for 3 month supply. Please advise. Pt said she usually gets 300 insulin pen needles (only takes one shot per day) and also gets 300 test strips  For 3 month period (only test twice a day). Please advise quantity on each rx.

## 2012-03-07 MED ORDER — INSULIN GLARGINE 100 UNIT/ML ~~LOC~~ SOLN: 70.0000 [IU] | Freq: Every day | SUBCUTANEOUS | Status: DC

## 2012-03-07 MED ORDER — INSULIN PEN NEEDLE 31G X 8 MM MISC: 1.0000 | Freq: Every day | Status: DC

## 2012-03-07 MED ORDER — GLUCOSE BLOOD VI STRP: ORAL_STRIP | Status: DC

## 2012-03-07 NOTE — Telephone Encounter (Signed)
She should only need 100 needles per 90 days.   70 units per day is 6300 units per 90 days.  Would need 21 pens, 5 pens per box, so she'll need 5 boxes = 25 pens.   200 strips sent, if testing BID.  rxs sent.

## 2012-04-23 ENCOUNTER — Encounter: Payer: Self-pay | Admitting: Family Medicine

## 2012-04-27 ENCOUNTER — Telehealth: Payer: Self-pay | Admitting: *Deleted

## 2012-04-27 NOTE — Telephone Encounter (Signed)
LMOVM of contact number.

## 2012-04-27 NOTE — Telephone Encounter (Signed)
Patient also asked that we phone in an order to [facility] to have a Zostavax sent to our office for her injection.  She was told by [facility] that this way, she will not have to pay for the injection.  Called [phone] to give verbal Rx for vaccine.

## 2012-04-27 NOTE — Telephone Encounter (Signed)
I would do just the A1c.  Thanks for calling in the zostavax.

## 2012-04-27 NOTE — Telephone Encounter (Signed)
Patient already has an order for an A1C to be drawn in the future at [facility].  She is asking if any other blood work needs to be done while she is having this drawn?  If so, please mail order for that as well.  We recently received a copy of her labs from August of last year and she says there were some abnormalities so if any of this needs to be rechecked, she'd like to get it all at the same time.  Please advise.

## 2012-05-02 LAB — HEMOGLOBIN A1C: A1c: 8.3

## 2012-05-08 ENCOUNTER — Encounter: Payer: Self-pay | Admitting: *Deleted

## 2012-05-08 ENCOUNTER — Encounter: Payer: Self-pay | Admitting: Family Medicine

## 2012-05-17 ENCOUNTER — Ambulatory Visit: Payer: 59 | Admitting: *Deleted

## 2012-05-17 DIAGNOSIS — Z23 Encounter for immunization: Secondary | ICD-10-CM

## 2012-07-18 ENCOUNTER — Ambulatory Visit: Payer: 59 | Admitting: Family Medicine

## 2012-08-08 ENCOUNTER — Other Ambulatory Visit: Payer: Self-pay

## 2012-08-08 MED ORDER — METFORMIN HCL 1000 MG PO TABS: 1000.0000 mg | ORAL_TABLET | Freq: Two times a day (BID) | ORAL | Status: DC

## 2012-08-08 NOTE — Telephone Encounter (Signed)
Pt is no longer using mail order pharmacy (pt notified [facility] not to refill med). Pt request refill to [facility]; pt will call and reschedule 6 mth f/u. Advised pt done.

## 2012-08-09 ENCOUNTER — Other Ambulatory Visit: Payer: Self-pay

## 2012-08-09 MED ORDER — METFORMIN HCL 1000 MG PO TABS: 1000.0000 mg | ORAL_TABLET | Freq: Two times a day (BID) | ORAL | Status: DC

## 2012-08-09 NOTE — Telephone Encounter (Signed)
Pt wanted metformin refill to [facility] [place] that was done on 08/08/12. I called [facility] [phone] spoke with Sandrea Hammond a pharmacist and she cancelled refill sent on 08/08/12 for metformin. Metformin sent to [facility] [place], I apologized to pt and notified sent to [facility]

## 2012-10-25 ENCOUNTER — Other Ambulatory Visit: Payer: Self-pay

## 2012-10-29 ENCOUNTER — Ambulatory Visit (INDEPENDENT_AMBULATORY_CARE_PROVIDER_SITE_OTHER): Payer: Self-pay | Admitting: Family Medicine

## 2012-10-29 ENCOUNTER — Encounter: Payer: Self-pay | Admitting: Family Medicine

## 2012-10-29 VITALS — BP 146/82 | HR 76 | Temp 98.1°F | Wt 219.5 lb

## 2012-10-29 DIAGNOSIS — E1142 Type 2 diabetes mellitus with diabetic polyneuropathy: Secondary | ICD-10-CM

## 2012-10-29 DIAGNOSIS — E1149 Type 2 diabetes mellitus with other diabetic neurological complication: Secondary | ICD-10-CM

## 2012-10-29 MED ORDER — INSULIN GLARGINE 100 UNIT/ML ~~LOC~~ SOLN: 70.0000 [IU] | Freq: Every day | SUBCUTANEOUS | Status: DC

## 2012-10-29 MED ORDER — ENALAPRIL MALEATE 20 MG PO TABS: 20.0000 mg | ORAL_TABLET | Freq: Every day | ORAL | Status: DC

## 2012-10-29 MED ORDER — INSULIN PEN NEEDLE 31G X 8 MM MISC: 1.0000 | Freq: Every day | Status: DC

## 2012-10-29 MED ORDER — METFORMIN HCL 1000 MG PO TABS: 1000.0000 mg | ORAL_TABLET | Freq: Two times a day (BID) | ORAL | Status: DC

## 2012-10-29 NOTE — Assessment & Plan Note (Signed)
Normal foot exam today.  Continue with current meds.  See notes on labs- written for cmet/lipid/a1c.  She'll have done at [facility].  D/w pt about diet/weight, weight stable.  Recheck in about 6 months.  She agrees with plan.

## 2012-10-29 NOTE — Patient Instructions (Addendum)
Keep working on [employer].   Recheck in about 6 months.   Don't change your meds for now.  Take care.  Glad to see you.

## 2012-10-29 NOTE — Progress Notes (Signed)
Diabetes:  Using medications without difficulties:yes Hypoglycemic episodes:no Hyperglycemic episodes:no Feet problems:no Blood Sugars averaging: see below eye exam within last year:yes Out of metformin in the last week and sugar came up some, prev around ~120 Due for labs.    Meds, vitals, and allergies reviewed.   ROS: See HPI.  Otherwise negative.    GEN: nad, alert and oriented HEENT: mucous membranes moist NECK: supple w/o LA CV: rrr. PULM: ctab, no inc wob ABD: soft, +bs EXT: no edema SKIN: no acute rash  Diabetic foot exam: Normal inspection No skin breakdown No calluses  Normal DP pulses Normal sensation to light touch and monofilament Nails normal

## 2012-10-31 ENCOUNTER — Encounter: Payer: Self-pay | Admitting: Family Medicine

## 2013-01-22 ENCOUNTER — Telehealth: Payer: Self-pay

## 2013-01-22 NOTE — Telephone Encounter (Signed)
Pt left v/m needs to change lantus to novolin or a generic med; pt cannot afford $ 300.00 for lantus; pt does not have insurance.pt can get one vial of  Generic novolin for $24.88.Please advise. [facility] [place]. Pt request cb when refilled.

## 2013-01-23 NOTE — Telephone Encounter (Signed)
When she comes in, find out her current dose and typical sugars.  Thanks.

## 2013-01-23 NOTE — Telephone Encounter (Signed)
Pt notified to pick up Lantus solostar; pt is coming to office now to pick up Lantus pen.  [facility] in [place] has ReLion novolin R (quick acting) $24.88/ 10 ml vial. ReLion novolin N (long acting) $24.88/ 10 ml vial and ReLion novolin 70/30 (mix) $24.88/ 10 ml vial. There are 1000 units per vial.

## 2013-01-23 NOTE — Telephone Encounter (Signed)
I have a pen of lantus here to give to patient.  That will last her about 4 days.   In the meantime, see if you can get a price list of insulins from [facility].  Thanks.

## 2013-01-23 NOTE — Telephone Encounter (Signed)
Pt picked up Lantus pen. Pt is presently taking Lantus 70 units at suppertime. Pt has not had insulin in 3 days. Pt usual FBS is 130 when taking Lantus. Since pt has not take lantus in 3 days pt's FBS this morning was 224; pt said she feels OK no diabetic symptoms. Dr Para March advised for pt to restart Lantus tonight at 70 units at suppertime and continue 70 units at suppertime daily. Dr Para March will let pt know new plan. Pt is aware will be a vial of insulin and will be drawing insulin up in syringe. Pt is comfortable doing that (pt's husband is long time diabetic that uses insulin).

## 2013-01-24 MED ORDER — INSULIN NPH ISOPHANE & REGULAR (70-30) 100 UNIT/ML ~~LOC~~ SUSP: SUBCUTANEOUS | Status: DC

## 2013-01-24 NOTE — Telephone Encounter (Signed)
I would switch over to the ReLion novolin 70/30 (mix) $24.88/ 10 ml vial.   Take 40 units in AM and 20 units in PM.  Total of 60 per day.  This is intentionally under-dosing her with the change, to prevent her sugar from going too low.  Have her update me with AM sugars and sugars from later in the day after the change.  If she has low sugars before updating [place], then cut back to 35 units AM and 15 units PM. Thanks.   Please send in the syringes and needles.  I sent the insulin rx.  Thanks.

## 2013-01-25 MED ORDER — "INSULIN SYRINGE-NEEDLE U-100 30G X 5/16"" 0.5 ML MISC": Status: DC

## 2013-01-25 NOTE — Telephone Encounter (Signed)
Patient advised.  Syringes and needles sent.

## 2013-02-19 ENCOUNTER — Encounter: Payer: Self-pay | Admitting: Family Medicine

## 2014-11-05 ENCOUNTER — Encounter: Payer: Self-pay | Admitting: Internal Medicine

## 2015-05-29 ENCOUNTER — Ambulatory Visit (INDEPENDENT_AMBULATORY_CARE_PROVIDER_SITE_OTHER): Payer: Commercial Managed Care - HMO | Admitting: Family Medicine

## 2015-05-29 ENCOUNTER — Other Ambulatory Visit: Payer: Self-pay | Admitting: Family Medicine

## 2015-05-29 ENCOUNTER — Encounter: Payer: Self-pay | Admitting: Family Medicine

## 2015-05-29 VITALS — BP 142/94 | HR 83 | Temp 98.7°F | Wt 206.0 lb

## 2015-05-29 DIAGNOSIS — Z23 Encounter for immunization: Secondary | ICD-10-CM | POA: Diagnosis not present

## 2015-05-29 DIAGNOSIS — Z119 Encounter for screening for infectious and parasitic diseases, unspecified: Secondary | ICD-10-CM

## 2015-05-29 DIAGNOSIS — E1149 Type 2 diabetes mellitus with other diabetic neurological complication: Secondary | ICD-10-CM

## 2015-05-29 DIAGNOSIS — E119 Type 2 diabetes mellitus without complications: Secondary | ICD-10-CM

## 2015-05-29 LAB — COMPREHENSIVE METABOLIC PANEL
ALBUMIN: 4.1 g/dL (ref 3.5–5.2)
ALT: 14 U/L (ref 0–35)
AST: 15 U/L (ref 0–37)
Alkaline Phosphatase: 89 U/L (ref 39–117)
BUN: 14 mg/dL (ref 6–23)
CHLORIDE: 101 meq/L (ref 96–112)
CO2: 31 meq/L (ref 19–32)
CREATININE: 0.62 mg/dL (ref 0.40–1.20)
Calcium: 10.1 mg/dL (ref 8.4–10.5)
GFR: 102.69 mL/min (ref >60.00)
Glucose, Bld: 333 mg/dL — ABNORMAL HIGH (ref 70–99)
Potassium: 4.1 mEq/L (ref 3.5–5.1)
SODIUM: 138 meq/L (ref 135–145)
Total Bilirubin: 0.4 mg/dL (ref 0.2–1.2)
Total Protein: 8.2 g/dL (ref 6.0–8.3)

## 2015-05-29 LAB — LIPID PANEL
CHOL/HDL RATIO: 7
Cholesterol: 220 mg/dL — ABNORMAL HIGH (ref 0–200)
HDL: 33.9 mg/dL — ABNORMAL LOW (ref >39.00)
LDL CALC: 148 mg/dL — AB (ref 0–99)
NonHDL: 186.58
Triglycerides: 193 mg/dL — ABNORMAL HIGH (ref 0.0–149.0)
VLDL: 38.6 mg/dL (ref 0.0–40.0)

## 2015-05-29 LAB — HEMOGLOBIN A1C: HEMOGLOBIN A1C: 13.2 % — AB (ref 4.6–6.5)

## 2015-05-29 MED ORDER — FLUCONAZOLE 150 MG PO TABS: 150.0000 mg | ORAL_TABLET | Freq: Once | ORAL | Status: DC

## 2015-05-29 MED ORDER — PROPRANOLOL HCL 10 MG PO TABS: 10.0000 mg | ORAL_TABLET | Freq: Every day | ORAL | Status: DC

## 2015-05-29 MED ORDER — ENALAPRIL MALEATE 20 MG PO TABS: 20.0000 mg | ORAL_TABLET | Freq: Every day | ORAL | Status: DC

## 2015-05-29 MED ORDER — INSULIN NPH ISOPHANE & REGULAR (70-30) 100 UNIT/ML ~~LOC~~ SUSP: SUBCUTANEOUS | Status: DC

## 2015-05-29 MED ORDER — "INSULIN SYRINGE-NEEDLE U-100 30G X 5/16"" 0.5 ML MISC": Status: DC

## 2015-05-29 MED ORDER — METFORMIN HCL 1000 MG PO TABS: 1000.0000 mg | ORAL_TABLET | Freq: Two times a day (BID) | ORAL | Status: DC

## 2015-05-29 NOTE — Patient Instructions (Addendum)
Take care.  Glad to see you.  Go to the lab on the way out.  We'll contact you with your lab report. Plan on recheck A1c in about 3 months at/before a visit.  Restart metformin, taking 1/2 tab a day initially, gradually working up to 2 pills a day as tolerated.  I would restart the insulin at 20 AM and 10 PM, but you may have to adjust downward.  Let me know if you have questions when you are checking your sugar.

## 2015-05-29 NOTE — Progress Notes (Signed)
Pre visit review using our clinic review tool, if applicable. No additional management support is needed unless otherwise documented below in the visit note.  She has moved in with her parents.  Her father had dementia.  Her mother has a broken hip.  She is working to help her family all the time.  Her father was in the service and she is going to check on [place] benefits.  She doesn't get chest pain with exertion but admits this is a high stress situation for her, ie anxiety provoking.   "We're doing the best we can."    Diabetes:  Using medications without difficulties: off meds recently.  Due for labs.  Hypoglycemic episodes:not since she has been off insulin.  Was prev on 70/30, taking 40 u in AM and 30 u in PM.   Hyperglycemic episodes: likely, not checked often Feet problems:some numbness in the feet, has been variable.  Blood Sugars averaging: not checked often.   eye exam within last year: done early 2016 per patient report, she'll f/u when possible.   Recently yeast infection with sugar likely up.  This has happened prev.   Her weight is down from being more active.   On BB for tremor with some relief.    PMH and SH reviewed  Meds, vitals, and allergies reviewed.   ROS: See HPI.  Otherwise negative.    GEN: nad, alert and oriented HEENT: mucous membranes moist NECK: supple w/o LA CV: rrr. PULM: ctab, no inc wob ABD: soft, +bs EXT: no edema SKIN: no acute rash Head tremor noted.   Diabetic foot exam: Normal inspection No skin breakdown No calluses  Normal DP pulses Normal sensation to light touch and monofilament Nails normal

## 2015-05-30 LAB — HIV ANTIBODY (ROUTINE TESTING W REFLEX): HIV: NONREACTIVE

## 2015-05-30 LAB — HEPATITIS C ANTIBODY: HCV Ab: NEGATIVE

## 2015-05-31 NOTE — Assessment & Plan Note (Signed)
She has some numbness in the feet, likely early neuropathy.   Plan on recheck A1c in about 3 months at/before a visit.   Restart metformin, taking 1/2 tab a day initially, gradually working up to 2 pills a day as tolerated.   I would restart the insulin at 20 AM and 10 PM as planned.  She'll likely have to increase the dose based on her A1c ,see notes on labs. Will ask her to call back with her sugar readings in about 1 week.   Okay for outpatient f/u.  >25 minutes spent in face to face time with patient, >50% spent in counselling or coordination of care.

## 2015-06-14 ENCOUNTER — Telehealth: Payer: Self-pay | Admitting: Family Medicine

## 2015-06-14 NOTE — Telephone Encounter (Signed)
Call pt.  Sugar log reviewed, in the upper 200s to 300s, now with some readings <200.  Generally better but not controlled.  Should be on insulin taking 20 units in AM and 10 units in the PM.  If her sugars are still >150, then I would inc 1 unit in the AM and PM per day.    Ex. 20/10-->21/11-->22/12-->23/13-->etc.    Update me as needed.  Plan on recheck later in the spring.  Thanks.

## 2015-06-15 NOTE — Telephone Encounter (Signed)
Left message with husband to have pt return call.

## 2015-06-15 NOTE — Telephone Encounter (Signed)
Patient advised.

## 2015-08-24 ENCOUNTER — Other Ambulatory Visit: Payer: Self-pay | Admitting: Family Medicine

## 2015-08-24 DIAGNOSIS — E1149 Type 2 diabetes mellitus with other diabetic neurological complication: Secondary | ICD-10-CM

## 2015-08-27 ENCOUNTER — Other Ambulatory Visit (INDEPENDENT_AMBULATORY_CARE_PROVIDER_SITE_OTHER): Payer: Commercial Managed Care - HMO

## 2015-08-27 DIAGNOSIS — E1149 Type 2 diabetes mellitus with other diabetic neurological complication: Secondary | ICD-10-CM

## 2015-08-27 LAB — HEMOGLOBIN A1C: Hgb A1c MFr Bld: 10.4 % — ABNORMAL HIGH (ref 4.6–6.5)

## 2015-08-31 ENCOUNTER — Encounter: Payer: Self-pay | Admitting: Family Medicine

## 2015-08-31 ENCOUNTER — Ambulatory Visit (INDEPENDENT_AMBULATORY_CARE_PROVIDER_SITE_OTHER): Payer: Commercial Managed Care - HMO | Admitting: Family Medicine

## 2015-08-31 VITALS — BP 122/80 | HR 78 | Temp 98.4°F | Wt 209.5 lb

## 2015-08-31 DIAGNOSIS — E1149 Type 2 diabetes mellitus with other diabetic neurological complication: Secondary | ICD-10-CM | POA: Diagnosis not present

## 2015-08-31 DIAGNOSIS — Z23 Encounter for immunization: Secondary | ICD-10-CM

## 2015-08-31 MED ORDER — PROPRANOLOL HCL 10 MG PO TABS: 10.0000 mg | ORAL_TABLET | Freq: Two times a day (BID) | ORAL | Status: DC

## 2015-08-31 MED ORDER — INSULIN NPH ISOPHANE & REGULAR (70-30) 100 UNIT/ML ~~LOC~~ SUSP: SUBCUTANEOUS | Status: DC

## 2015-08-31 NOTE — Patient Instructions (Addendum)
You can call for a mammogram at: [facility]  [address]  Let me know when you want to go for the colonoscopy and when you need a refill on the propranolol.   Recheck in about 3 months, labs ahead of time.  Keep working on your sugar in the meantime and slowly adjust your insulin up as needed.   Update me as needed.   I'll be thinking about you and your family.   Take care.  Glad to see you.

## 2015-08-31 NOTE — Progress Notes (Signed)
Pre visit review using our clinic review tool, if applicable. No additional management support is needed unless otherwise documented below in the visit note.  She is trying to get help with placement (via the [place]) for her father.  Caring for him is still a big strain for the patient; he can get agitated.  D/w pt, she is still safe at home.  Pt's husband is still O2 dependent.  Her nephew has to have heart surgery.  He had a congenital defect that needs repair. This is a difficult situation overall for patient.    Diabetes:  Using medications without difficulties: yes Hypoglycemic episodes: no Hyperglycemic episodes: no, improved overall with insulin use.   Feet problems: some pain and tingling at night. D/w pt.  Some better as sugar improves.   Blood Sugars averaging:  ~170 in the AM recently.   eye exam within last year:  D/w pt, due.   A1c d/w pt, not at goal but much better.  It may be lagging her progress, d/w pt.    D/w patient [id number] for colon cancer screening, ie f/u colonoscopy.  She is going to schedule as soon as she can.  See social hx above.    She had cut back to 10mg  propranolol but 20mg  helped her tremor better.    Meds, vitals, and allergies reviewed.   ROS: Per HPI unless specifically indicated in ROS section   GEN: nad, alert and oriented HEENT: mucous membranes moist NECK: supple w/o LA CV: rrr. PULM: ctab, no inc wob ABD: soft, +bs EXT: no edema SKIN: no acute rash  Diabetic foot exam: Normal inspection No skin breakdown but irritation noted on the B distal 1st MTs (wore tight shoes yesterday, no ulceration, d/w pt about foot care and shoe choice) No calluses  Normal DP pulses Normal sensation to light touch B but dec monofilament R foot Nails normal

## 2015-09-01 NOTE — Assessment & Plan Note (Signed)
A1c d/w pt, not at goal but much better.  It may be lagging her progress, d/w pt.   Continue with slow inc in insulin, she is working on diet as best she can.  Recheck A1c in about 3 months.   D/w pt about foot care. I'm hopeful that her next A1c will still be sig better than today.   >25 minutes spent in face to face time with patient, >50% spent in counselling or coordination of care Support offered re: her family situation.  She'll check with PCP of her father to see if they have SW who can help with placement.

## 2015-10-30 ENCOUNTER — Observation Stay: Payer: Commercial Managed Care - HMO | Admitting: Anesthesiology

## 2015-10-30 ENCOUNTER — Emergency Department: Payer: Commercial Managed Care - HMO

## 2015-10-30 ENCOUNTER — Inpatient Hospital Stay
Admission: EM | Admit: 2015-10-30 | Discharge: 2015-11-04 | DRG: 336 | Disposition: A | Discharge status: Home / Self Care | Payer: Commercial Managed Care - HMO | Attending: Surgery | Admitting: Surgery

## 2015-10-30 ENCOUNTER — Telehealth: Payer: Self-pay | Admitting: Family Medicine

## 2015-10-30 ENCOUNTER — Encounter
Admission: EM | Disposition: A | Discharge status: Home / Self Care | Payer: Self-pay | Source: Home / Self Care | Attending: Surgery

## 2015-10-30 ENCOUNTER — Encounter: Payer: Self-pay | Admitting: Emergency Medicine

## 2015-10-30 DIAGNOSIS — K660 Peritoneal adhesions (postprocedural) (postinfection): Secondary | ICD-10-CM | POA: Diagnosis present

## 2015-10-30 DIAGNOSIS — I10 Essential (primary) hypertension: Secondary | ICD-10-CM | POA: Diagnosis present

## 2015-10-30 DIAGNOSIS — E1165 Type 2 diabetes mellitus with hyperglycemia: Secondary | ICD-10-CM | POA: Diagnosis present

## 2015-10-30 DIAGNOSIS — D649 Anemia, unspecified: Secondary | ICD-10-CM | POA: Diagnosis present

## 2015-10-30 DIAGNOSIS — E785 Hyperlipidemia, unspecified: Secondary | ICD-10-CM | POA: Diagnosis present

## 2015-10-30 DIAGNOSIS — E119 Type 2 diabetes mellitus without complications: Secondary | ICD-10-CM | POA: Diagnosis not present

## 2015-10-30 DIAGNOSIS — J45909 Unspecified asthma, uncomplicated: Secondary | ICD-10-CM | POA: Diagnosis not present

## 2015-10-30 DIAGNOSIS — K219 Gastro-esophageal reflux disease without esophagitis: Secondary | ICD-10-CM | POA: Diagnosis present

## 2015-10-30 DIAGNOSIS — Z800 Family history of malignant neoplasm of digestive organs: Secondary | ICD-10-CM | POA: Diagnosis not present

## 2015-10-30 DIAGNOSIS — G4730 Sleep apnea, unspecified: Secondary | ICD-10-CM | POA: Diagnosis present

## 2015-10-30 DIAGNOSIS — Z801 Family history of malignant neoplasm of trachea, bronchus and lung: Secondary | ICD-10-CM

## 2015-10-30 DIAGNOSIS — Z79899 Other long term (current) drug therapy: Secondary | ICD-10-CM | POA: Diagnosis not present

## 2015-10-30 DIAGNOSIS — Z8049 Family history of malignant neoplasm of other genital organs: Secondary | ICD-10-CM

## 2015-10-30 DIAGNOSIS — D62 Acute posthemorrhagic anemia: Secondary | ICD-10-CM | POA: Diagnosis not present

## 2015-10-30 DIAGNOSIS — Z7982 Long term (current) use of aspirin: Secondary | ICD-10-CM | POA: Diagnosis not present

## 2015-10-30 DIAGNOSIS — K3580 Unspecified acute appendicitis: Secondary | ICD-10-CM | POA: Diagnosis present

## 2015-10-30 DIAGNOSIS — Z8249 Family history of ischemic heart disease and other diseases of the circulatory system: Secondary | ICD-10-CM | POA: Diagnosis not present

## 2015-10-30 DIAGNOSIS — R1084 Generalized abdominal pain: Secondary | ICD-10-CM | POA: Diagnosis not present

## 2015-10-30 DIAGNOSIS — K353 Acute appendicitis with localized peritonitis: Secondary | ICD-10-CM | POA: Diagnosis present

## 2015-10-30 DIAGNOSIS — Z794 Long term (current) use of insulin: Secondary | ICD-10-CM

## 2015-10-30 DIAGNOSIS — K567 Ileus, unspecified: Secondary | ICD-10-CM | POA: Diagnosis not present

## 2015-10-30 DIAGNOSIS — K37 Unspecified appendicitis: Secondary | ICD-10-CM | POA: Diagnosis not present

## 2015-10-30 HISTORY — PX: LAPAROSCOPIC APPENDECTOMY: SHX408

## 2015-10-30 LAB — CBC
HCT: 42.9 % (ref 35.0–47.0)
Hemoglobin: 14.3 g/dL (ref 12.0–16.0)
MCH: 28.7 pg (ref 26.0–34.0)
MCHC: 33.3 g/dL (ref 32.0–36.0)
MCV: 86.2 fL (ref 80.0–100.0)
PLATELETS: 281 10*3/uL (ref 150–440)
RBC: 4.97 MIL/uL (ref 3.80–5.20)
RDW: 13.8 % (ref 11.5–14.5)
WBC: 15.7 10*3/uL — ABNORMAL HIGH (ref 3.6–11.0)

## 2015-10-30 LAB — URINALYSIS COMPLETE WITH MICROSCOPIC (ARMC ONLY)
BILIRUBIN URINE: NEGATIVE
Bacteria, UA: NONE SEEN
Glucose, UA: 50 mg/dL — AB
Hgb urine dipstick: NEGATIVE
Leukocytes, UA: NEGATIVE
Nitrite: NEGATIVE
PROTEIN: 30 mg/dL — AB
Specific Gravity, Urine: 1.02 (ref 1.005–1.030)
pH: 5 (ref 5.0–8.0)

## 2015-10-30 LAB — COMPREHENSIVE METABOLIC PANEL
ALBUMIN: 3.9 g/dL (ref 3.5–5.0)
ALK PHOS: 95 U/L (ref 38–126)
ALT: 13 U/L — AB (ref 14–54)
AST: 18 U/L (ref 15–41)
Anion gap: 9 (ref 5–15)
BUN: 12 mg/dL (ref 6–20)
CALCIUM: 9.6 mg/dL (ref 8.9–10.3)
CO2: 27 mmol/L (ref 22–32)
CREATININE: 0.66 mg/dL (ref 0.44–1.00)
Chloride: 100 mmol/L — ABNORMAL LOW (ref 101–111)
GFR calc Af Amer: >60 mL/min (ref >60)
GFR calc non Af Amer: >60 mL/min (ref >60)
GLUCOSE: 237 mg/dL — AB (ref 65–99)
Potassium: 4.7 mmol/L (ref 3.5–5.1)
SODIUM: 136 mmol/L (ref 135–145)
Total Bilirubin: 0.8 mg/dL (ref 0.3–1.2)
Total Protein: 7.7 g/dL (ref 6.5–8.1)

## 2015-10-30 LAB — MRSA PCR SCREENING: MRSA BY PCR: POSITIVE — AB

## 2015-10-30 LAB — LIPASE, BLOOD: Lipase: 15 U/L (ref 11–51)

## 2015-10-30 SURGERY — APPENDECTOMY, LAPAROSCOPIC
Anesthesia: General | Site: Abdomen | Wound class: Contaminated

## 2015-10-30 MED ORDER — MORPHINE SULFATE (PF) 4 MG/ML IV SOLN
4.0000 mg | Freq: Once | INTRAVENOUS | Status: AC
  Administered 2015-10-30: 4 mg via INTRAVENOUS
  Filled 2015-10-30: qty 1

## 2015-10-30 MED ORDER — FENTANYL CITRATE (PF) 100 MCG/2ML IJ SOLN
INTRAMUSCULAR | Status: DC | PRN
  Administered 2015-10-30 (×5): 50 ug via INTRAVENOUS
  Administered 2015-10-30: 100 ug via INTRAVENOUS

## 2015-10-30 MED ORDER — HYDROMORPHONE HCL 1 MG/ML IJ SOLN: 1.0000 mg | INTRAMUSCULAR | Status: DC | PRN

## 2015-10-30 MED ORDER — GLYCOPYRROLATE 0.2 MG/ML IJ SOLN
INTRAMUSCULAR | Status: DC | PRN
  Administered 2015-10-30: 0.6 mg via INTRAVENOUS

## 2015-10-30 MED ORDER — LACTATED RINGERS IV SOLN
INTRAVENOUS | Status: DC | PRN
  Administered 2015-10-30 (×2): via INTRAVENOUS

## 2015-10-30 MED ORDER — POTASSIUM CHLORIDE IN NACL 20-0.45 MEQ/L-% IV SOLN
INTRAVENOUS | Status: DC
Start: 2015-10-30 — End: 2015-10-31
  Filled 2015-10-30 (×2): qty 1000

## 2015-10-30 MED ORDER — KETOROLAC TROMETHAMINE 30 MG/ML IJ SOLN
INTRAMUSCULAR | Status: DC | PRN
  Administered 2015-10-30: 30 mg via INTRAVENOUS

## 2015-10-30 MED ORDER — DEXAMETHASONE SODIUM PHOSPHATE 10 MG/ML IJ SOLN
INTRAMUSCULAR | Status: DC | PRN
  Administered 2015-10-30: 5 mg via INTRAVENOUS

## 2015-10-30 MED ORDER — IOPAMIDOL (ISOVUE-300) INJECTION 61%
100.0000 mL | Freq: Once | INTRAVENOUS | Status: AC | PRN
  Administered 2015-10-30: 100 mL via INTRAVENOUS

## 2015-10-30 MED ORDER — DIATRIZOATE MEGLUMINE & SODIUM 66-10 % PO SOLN
15.0000 mL | Freq: Once | ORAL | Status: AC
  Administered 2015-10-30: 15 mL via ORAL

## 2015-10-30 MED ORDER — ONDANSETRON 8 MG PO TBDP: 4.0000 mg | ORAL_TABLET | Freq: Four times a day (QID) | ORAL | Status: DC | PRN

## 2015-10-30 MED ORDER — ONDANSETRON HCL 4 MG/2ML IJ SOLN
4.0000 mg | Freq: Four times a day (QID) | INTRAMUSCULAR | Status: DC | PRN
  Administered 2015-10-30: 4 mg via INTRAVENOUS

## 2015-10-30 MED ORDER — BUPIVACAINE HCL (PF) 0.5 % IJ SOLN
INTRAMUSCULAR | Status: DC | PRN
  Administered 2015-10-30: 30 mL

## 2015-10-30 MED ORDER — ROCURONIUM BROMIDE 100 MG/10ML IV SOLN
INTRAVENOUS | Status: DC | PRN
  Administered 2015-10-30: 40 mg via INTRAVENOUS
  Administered 2015-10-30 (×3): 10 mg via INTRAVENOUS

## 2015-10-30 MED ORDER — MORPHINE SULFATE (PF) 4 MG/ML IV SOLN
4.0000 mg | Freq: Once | INTRAVENOUS | Status: AC
  Administered 2015-10-30: 4 mg via INTRAVENOUS

## 2015-10-30 MED ORDER — MIDAZOLAM HCL 2 MG/2ML IJ SOLN
INTRAMUSCULAR | Status: DC | PRN
  Administered 2015-10-30: 2 mg via INTRAVENOUS

## 2015-10-30 MED ORDER — ACETAMINOPHEN 325 MG PO TABS: 650.0000 mg | ORAL_TABLET | Freq: Four times a day (QID) | ORAL | Status: DC | PRN

## 2015-10-30 MED ORDER — PROPOFOL 10 MG/ML IV BOLUS
INTRAVENOUS | Status: DC | PRN
  Administered 2015-10-30: 160 mg via INTRAVENOUS

## 2015-10-30 MED ORDER — ENOXAPARIN SODIUM 40 MG/0.4ML ~~LOC~~ SOLN: 40.0000 mg | SUBCUTANEOUS | Status: DC

## 2015-10-30 MED ORDER — ACETAMINOPHEN 10 MG/ML IV SOLN
INTRAVENOUS | Status: DC | PRN
  Administered 2015-10-30: 1000 mg via INTRAVENOUS

## 2015-10-30 MED ORDER — MORPHINE SULFATE (PF) 4 MG/ML IV SOLN
INTRAVENOUS | Status: AC
  Administered 2015-10-30: 4 mg via INTRAVENOUS
  Filled 2015-10-30: qty 1

## 2015-10-30 MED ORDER — PIPERACILLIN-TAZOBACTAM 3.375 G IVPB
3.3750 g | Freq: Three times a day (TID) | INTRAVENOUS | Status: DC
  Administered 2015-10-30 – 2015-11-03 (×13): 3.375 g via INTRAVENOUS
  Filled 2015-10-30 (×15): qty 50

## 2015-10-30 MED ORDER — NEOSTIGMINE METHYLSULFATE 10 MG/10ML IV SOLN
INTRAVENOUS | Status: DC | PRN
  Administered 2015-10-30: 4 mg via INTRAVENOUS

## 2015-10-30 MED ORDER — ACETAMINOPHEN 650 MG RE SUPP: 650.0000 mg | Freq: Four times a day (QID) | RECTAL | Status: DC | PRN

## 2015-10-30 MED ORDER — BUPIVACAINE HCL (PF) 0.5 % IJ SOLN
INTRAMUSCULAR | Status: AC
  Filled 2015-10-30: qty 30

## 2015-10-30 MED ORDER — ACETAMINOPHEN 10 MG/ML IV SOLN
INTRAVENOUS | Status: AC
  Filled 2015-10-30: qty 100

## 2015-10-30 MED ORDER — LIDOCAINE HCL (CARDIAC) 20 MG/ML IV SOLN
INTRAVENOUS | Status: DC | PRN
  Administered 2015-10-30: 30 mg via INTRAVENOUS

## 2015-10-30 SURGICAL SUPPLY — 40 items
APPLICATOR COTTON TIP 6IN STRL (MISCELLANEOUS) ×3 IMPLANT
APPLIER CLIP 5 13 M/L LIGAMAX5 (MISCELLANEOUS) ×3
CANISTER SUCT 1200ML W/VALVE (MISCELLANEOUS) ×3 IMPLANT
CHLORAPREP W/TINT 26ML (MISCELLANEOUS) ×3 IMPLANT
CLIP APPLIE 5 13 M/L LIGAMAX5 (MISCELLANEOUS) ×1 IMPLANT
CUTTER FLEX LINEAR 45M (STAPLE) ×3 IMPLANT
DRAIN CHANNEL JP 15F RND 16 (MISCELLANEOUS) ×3 IMPLANT
ELECT CAUTERY BLADE 6.4 (BLADE) IMPLANT
ELECT REM PT RETURN 9FT ADLT (ELECTROSURGICAL) ×3
ELECTRODE REM PT RTRN 9FT ADLT (ELECTROSURGICAL) ×1 IMPLANT
ENDOPOUCH RETRIEVER 10 (MISCELLANEOUS) ×3 IMPLANT
GLOVE PI ORTHOPRO 6.5 (GLOVE) ×8
GLOVE PI ORTHOPRO STRL 6.5 (GLOVE) ×4 IMPLANT
GOWN STRL REUS W/ TWL LRG LVL3 (GOWN DISPOSABLE) ×2 IMPLANT
GOWN STRL REUS W/TWL LRG LVL3 (GOWN DISPOSABLE) ×4
IRRIGATION STRYKERFLOW (MISCELLANEOUS) ×1 IMPLANT
IRRIGATOR STRYKERFLOW (MISCELLANEOUS) ×3
IV NS 1000ML (IV SOLUTION) ×2
IV NS 1000ML BAXH (IV SOLUTION) ×1 IMPLANT
KIT RM TURNOVER STRD PROC AR (KITS) ×3 IMPLANT
LABEL OR SOLS (LABEL) IMPLANT
LIQUID BAND (GAUZE/BANDAGES/DRESSINGS) ×3 IMPLANT
NEEDLE HYPO 25X1 1.5 SAFETY (NEEDLE) ×3 IMPLANT
NS IRRIG 500ML POUR BTL (IV SOLUTION) ×3 IMPLANT
PACK LAP CHOLECYSTECTOMY (MISCELLANEOUS) ×3 IMPLANT
PENCIL ELECTRO HAND CTR (MISCELLANEOUS) ×3 IMPLANT
RELOAD 45 VASCULAR/THIN (ENDOMECHANICALS) ×3 IMPLANT
RELOAD STAPLE TA45 3.5 REG BLU (ENDOMECHANICALS) ×3 IMPLANT
SCISSORS METZENBAUM CVD 33 (INSTRUMENTS) ×3 IMPLANT
SHEARS HARMONIC ACE PLUS 36CM (ENDOMECHANICALS) IMPLANT
SLEEVE ENDOPATH XCEL 5M (ENDOMECHANICALS) ×3 IMPLANT
SUT ETHILON 3-0 KS 30 BLK (SUTURE) ×3 IMPLANT
SUT MNCRL 4-0 (SUTURE) ×2
SUT MNCRL 4-0 27XMFL (SUTURE) ×1
SUT VICRYL 0 AB UR-6 (SUTURE) ×3 IMPLANT
SUTURE MNCRL 4-0 27XMF (SUTURE) ×1 IMPLANT
TRAY FOLEY W/METER SILVER 16FR (SET/KITS/TRAYS/PACK) ×3 IMPLANT
TROCAR XCEL BLUNT TIP 100MML (ENDOMECHANICALS) ×3 IMPLANT
TROCAR XCEL NON-BLD 5MMX100MML (ENDOMECHANICALS) ×6 IMPLANT
TUBING INSUFFLATOR HI FLOW (MISCELLANEOUS) ×3 IMPLANT

## 2015-10-30 NOTE — ED Notes (Signed)
Pt presents with abd pain started yesterday with some nausea.

## 2015-10-30 NOTE — Telephone Encounter (Signed)
Patient Name: Alexis [name]  DOB: 24-Jan-1951    Initial Comment Caller says started having abd pains across her abd and down Rt side yesterday. She is nauseated today. Today her abd pains are Moderate.    Nurse Assessment  Nurse: Verlin Fester RN, Stanton Kidney Date/Time (Eastern Time): 10/30/2015 9:57:54 AM  Confirm and document reason for call. If symptomatic, describe symptoms. You must click the next button to save text entered. ---Patient started having abdomen pains across her abd and down Rt side yesterday. She is nauseated today. Today her abd pains are Moderate.  Has the patient traveled out of the country within the last 30 days? ---No  Does the patient have any new or worsening symptoms? ---Yes  Will a triage be completed? ---Yes  Related visit to physician within the last 2 weeks? ---No  Does the PT have any chronic conditions? (i.e. diabetes, asthma, etc.) ---Yes  List chronic conditions. ---"HTN, diabetes"  Is this a behavioral health or substance abuse call? ---No     Guidelines    Guideline Title Affirmed Question Affirmed Notes  Abdominal Pain - Upper [1] SEVERE pain (e.g., excruciating) AND [2] present > 1 hour    Final Disposition User   Go to ED Now Verlin Fester, RN, [facility] - ED   Disagree/Comply: Comply

## 2015-10-30 NOTE — ED Provider Notes (Signed)
[facility] Emergency Department Provider Note        Time seen: ----------------------------------------- 2:23 PM on 10/30/2015 -----------------------------------------    I have reviewed the triage vital signs and the nursing notes.   HISTORY  Chief Complaint Abdominal Pain    HPI Alexis [name] is a 65 y.o. female who presents to ER for abdominal pain that started yesterday with some nausea. She states nothing makes her symptoms better or worse, she denies fevers or chills. She is also not had diarrhea, had normal bowel movements yesterday. She has not seen any blood.   Past Medical History  Diagnosis Date  . GERD (gastroesophageal reflux disease)   . Allergy   . Hyperlipidemia   . Hx of colonic polyps   . Blood in stool   . Asthma     in childhood  . Diabetes mellitus     dx'd at age ~59  . Hypertension     dx'd at age ~20  . Tremor     only in face; worse under periods of stress. father with tremor also  . Sleep apnea     on CPAP    Patient Active Problem List   Diagnosis Date Noted  . FH: colon cancer 10/18/2011  . Meralgia paraesthetica 02/15/2011  . 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 Shoulder pain, left 09/21/2010  . Diverticulosis 08/09/2010  . Pure hypercholesterolemia 07/27/2010  . Essential hypertension, benign 07/27/2010    Past Surgical History  Procedure Laterality Date  . Tonsillectomy and adenoidectomy  1957  . Abdominal hysterectomy  1988    Surgery included removal of benign pelvic tumor    Allergies Review of patient's allergies indicates no known allergies.  Social History Social History  Substance Use Topics  . Smoking status: Never Smoker   . Smokeless tobacco: Never Used  . Alcohol Use: No    Review of Systems Constitutional: Negative for fever. Cardiovascular: Negative for chest pain. Respiratory: Negative for shortness of breath. Gastrointestinal:Positive for  abdominal pain Genitourinary: Negative for dysuria. Musculoskeletal: Negative for back pain. Skin: Negative for rash. Neurological: Negative for headaches, focal weakness or numbness.  10-point ROS otherwise negative.  ____________________________________________   PHYSICAL EXAM:  VITAL SIGNS: ED Triage Vitals  Enc Vitals Group     BP 10/30/15 1101 162/78 mmHg     Pulse Rate 10/30/15 1101 98     Resp 10/30/15 1101 16     Temp 10/30/15 1101 99.2 F (37.3 C)     Temp Source 10/30/15 1101 Oral     SpO2 10/30/15 1101 95 %     Weight 10/30/15 1101 208 lb (94.348 kg)     Height 10/30/15 1101 5\' 3"  (1.6 m)     Head Cir --      Peak Flow --      Pain Score 10/30/15 1102 7     Pain Loc --      Pain Edu? --      Excl. in [place]? --     Constitutional: Alert and oriented. Well appearing and in no distress. Eyes: Conjunctivae are normal. PERRL. Normal extraocular movements. ENT   Head: Normocephalic and atraumatic.   Nose: No congestion/rhinnorhea.   Mouth/Throat: Mucous membranes are moist.   Neck: No stridor. Cardiovascular: Normal rate, regular rhythm. No murmurs, rubs, or gallops. Respiratory: Normal respiratory effort without tachypnea nor retractions. Breath sounds are clear and equal bilaterally. No wheezes/rales/rhonchi. Gastrointestinal: Nonfocal tenderness, no rebound or guarding. Normal bowel  sounds. Musculoskeletal: Nontender with normal range of motion in all extremities. No lower extremity tenderness nor edema. Neurologic:  Normal speech and language. No gross focal neurologic deficits are appreciated.  Skin:  Skin is warm, dry and intact. No rash noted. Psychiatric: Mood and affect are normal. Speech and behavior are normal.  ____________________________________________  ED COURSE:  Pertinent labs & imaging results that were available during my care of the patient were reviewed by me and considered in my medical decision making (see chart for  details). Patient is no acute distress, will check basic labs and likely perform CT imaging. ____________________________________________    LABS (pertinent positives/negatives)  Labs Reviewed  MRSA PCR SCREENING - Abnormal; Notable for the following:    MRSA by PCR POSITIVE (*)    All other components within normal limits  COMPREHENSIVE METABOLIC PANEL - Abnormal; Notable for the following:    Chloride 100 (*)    Glucose, Bld 237 (*)    ALT 13 (*)    All other components within normal limits  CBC - Abnormal; Notable for the following:    WBC 15.7 (*)    All other components within normal limits  URINALYSIS COMPLETEWITH MICROSCOPIC ([facility] ONLY) - Abnormal; Notable for the following:    Color, Urine YELLOW (*)    APPearance CLEAR (*)    Glucose, UA 50 (*)    Ketones, ur 1+ (*)    Protein, ur 30 (*)    Squamous Epithelial / LPF 0-5 (*)    All other components within normal limits  BASIC METABOLIC PANEL - Abnormal; Notable for the following:    Sodium 133 (*)    Chloride 100 (*)    Glucose, Bld 317 (*)    Calcium 8.2 (*)    All other components within normal limits  CBC - Abnormal; Notable for the following:    WBC 21.8 (*)    Hemoglobin 11.7 (*)    HCT 34.1 (*)    All other components within normal limits  GLUCOSE, CAPILLARY - Abnormal; Notable for the following:    Glucose-Capillary 297 (*)    All other components within normal limits  GLUCOSE, CAPILLARY - Abnormal; Notable for the following:    Glucose-Capillary 314 (*)    All other components within normal limits  GLUCOSE, CAPILLARY - Abnormal; Notable for the following:    Glucose-Capillary 340 (*)    All other components within normal limits  HEMOGLOBIN AND HEMATOCRIT, BLOOD - Abnormal; Notable for the following:    Hemoglobin 9.6 (*)    HCT 28.5 (*)    All other components within normal limits  GLUCOSE, CAPILLARY - Abnormal; Notable for the following:    Glucose-Capillary 355 (*)    All other components within  normal limits  GLUCOSE, CAPILLARY - Abnormal; Notable for the following:    Glucose-Capillary 277 (*)    All other components within normal limits  HEMOGLOBIN AND HEMATOCRIT, BLOOD - Abnormal; Notable for the following:    Hemoglobin 7.9 (*)    HCT 23.3 (*)    All other components within normal limits  GLUCOSE, CAPILLARY - Abnormal; Notable for the following:    Glucose-Capillary 216 (*)    All other components within normal limits  CBC - Abnormal; Notable for the following:    WBC 14.9 (*)    RBC 3.20 (*)    Hemoglobin 9.6 (*)    HCT 27.4 (*)    All other components within normal limits  BASIC METABOLIC PANEL - Abnormal;  Notable for the following:    Glucose, Bld 138 (*)    Calcium 7.4 (*)    All other components within normal limits  GLUCOSE, CAPILLARY - Abnormal; Notable for the following:    Glucose-Capillary 205 (*)    All other components within normal limits  GLUCOSE, CAPILLARY - Abnormal; Notable for the following:    Glucose-Capillary 167 (*)    All other components within normal limits  GLUCOSE, CAPILLARY - Abnormal; Notable for the following:    Glucose-Capillary 130 (*)    All other components within normal limits  GLUCOSE, CAPILLARY - Abnormal; Notable for the following:    Glucose-Capillary 134 (*)    All other components within normal limits  HEMOGLOBIN AND HEMATOCRIT, BLOOD - Abnormal; Notable for the following:    Hemoglobin 9.2 (*)    HCT 26.9 (*)    All other components within normal limits  GLUCOSE, CAPILLARY - Abnormal; Notable for the following:    Glucose-Capillary 125 (*)    All other components within normal limits  HEMOGLOBIN A1C - Abnormal; Notable for the following:    Hgb A1c MFr Bld 8.5 (*)    All other components within normal limits  GLUCOSE, CAPILLARY - Abnormal; Notable for the following:    Glucose-Capillary 178 (*)    All other components within normal limits  CBC - Abnormal; Notable for the following:    RBC 2.92 (*)    Hemoglobin  8.8 (*)    HCT 25.1 (*)    All other components within normal limits  BASIC METABOLIC PANEL - Abnormal; Notable for the following:    Glucose, Bld 130 (*)    Calcium 7.5 (*)    Anion gap 3 (*)    All other components within normal limits  GLUCOSE, CAPILLARY - Abnormal; Notable for the following:    Glucose-Capillary 199 (*)    All other components within normal limits  GLUCOSE, CAPILLARY - Abnormal; Notable for the following:    Glucose-Capillary 142 (*)    All other components within normal limits  GLUCOSE, CAPILLARY - Abnormal; Notable for the following:    Glucose-Capillary 133 (*)    All other components within normal limits  GLUCOSE, CAPILLARY - Abnormal; Notable for the following:    Glucose-Capillary 126 (*)    All other components within normal limits  GLUCOSE, CAPILLARY - Abnormal; Notable for the following:    Glucose-Capillary 128 (*)    All other components within normal limits  GLUCOSE, CAPILLARY - Abnormal; Notable for the following:    Glucose-Capillary 169 (*)    All other components within normal limits  CBC - Abnormal; Notable for the following:    RBC 3.04 (*)    Hemoglobin 9.1 (*)    HCT 26.7 (*)    All other components within normal limits  GLUCOSE, CAPILLARY - Abnormal; Notable for the following:    Glucose-Capillary 176 (*)    All other components within normal limits  GLUCOSE, CAPILLARY - Abnormal; Notable for the following:    Glucose-Capillary 153 (*)    All other components within normal limits  GLUCOSE, CAPILLARY - Abnormal; Notable for the following:    Glucose-Capillary 168 (*)    All other components within normal limits  GLUCOSE, CAPILLARY - Abnormal; Notable for the following:    Glucose-Capillary 189 (*)    All other components within normal limits  CBC - Abnormal; Notable for the following:    RBC 3.08 (*)    Hemoglobin 9.4 (*)  HCT 26.8 (*)    All other components within normal limits  GLUCOSE, CAPILLARY - Abnormal; Notable for the  following:    Glucose-Capillary 235 (*)    All other components within normal limits  LIPASE, BLOOD  PROTIME-INR  APTT  TYPE AND SCREEN  PREPARE RBC (CROSSMATCH)  ABO/RH  SURGICAL PATHOLOGY    RADIOLOGY Images were viewed by me  CT of abdomen and pelvis with contrast IMPRESSION: 1. Markedly thickened, inflamed appendix with periappendiceal edema/ inflammation. Imaging features consistent with acute appendicitis. 2. High attenuation in the fundus of the gallbladder may be related to stones trapped behind a gallbladder fold. This etiology is favored for the appearance given that there does not appear to be any washout in this region on renal delay imaging, but soft tissue thickening related to adenomyomatosis or neoplasm cannot be entirely excluded. MRI without and with contrast may prove helpful to further evaluate. Imaging findings of potential clinical significance:  Abdominal Aortic Atherosclerois (ICD10-170.0) ____________________________________________  FINAL ASSESSMENT AND PLAN  Appendicitis  Plan: Patient with labs and imaging as dictated above. Patient was CT evidence of acute appendicitis, we will consult surgery for evaluation and laparoscopic appendectomy. Patient is medically stable for surgical consultation at this time.   Earleen Newport, MD   Note: This dictation was prepared with Dragon dictation. Any transcriptional errors that result from this process are unintentional   Earleen Newport, MD 11/04/15 [phone]

## 2015-10-30 NOTE — H&P (Signed)
Alexis [name] is a 65 y.o. female  with approximately 24 hours of generalized abdominal pain.  HPI: She was in her usual state of health until yesterday afternoon when she developed generalized abdominal pain which focused in the right lower quadrant over the course of the evening. The pain was intense constant not colicky associated with nausea and dry heaves without any vomiting. She felt chilled and febrile but did not have any temperature when she took it. She's not had any previous similar episodes. She denies any diarrhea or constipation. She had normal bowel movement this morning. She last ate yesterday afternoon.  She has no other major GI problems. She has had a colonoscopy which revealed diverticulosis and polyp disease. She denies any history of hepatitis, yellow jaundice, pancreatitis, peptic ulcer disease, gallbladder disease, or diverticulitis. She's had a previous transabdominal hysterectomy but no other abdominal surgery.  Workup in the emergency room revealed an elevated white blood cell count persistent right lower quadrant pain. CT scan with contrast demonstrated a dilated appendix 17 mm with periappendiceal fluid and stranding consistent with acute appendicitis. Surgical service was consulted.  Past Medical History  Diagnosis Date  . GERD (gastroesophageal reflux disease)   . Allergy   . Hyperlipidemia   . Hx of colonic polyps   . Blood in stool   . Asthma     in childhood  . Diabetes mellitus     dx'd at age ~52  . Hypertension     dx'd at age ~12  . Tremor     only in face; worse under periods of stress. father with tremor also  . Sleep apnea     on CPAP   Past Surgical History  Procedure Laterality Date  . Tonsillectomy and adenoidectomy  1957  . Abdominal hysterectomy  1988    Surgery included removal of benign pelvic tumor   Social History   Social History  . Marital Status: Married    Spouse Name: N/A  . Number of Children: N/A  . Years of Education:  N/A   Occupational History  . [employer]    Social History Main Topics  . Smoking status: Never Smoker   . Smokeless tobacco: Never Used  . Alcohol Use: No  . Drug Use: No  . Sexual Activity: Not Asked   Other Topics Concern  . None   Social History Narrative   Education:  12th grade   Working at [employer].   Married 1970 to [employer]   Enjoys playing piano (plays for church), time at [employer], time with grandkids   2 kids   caffeine daily-2 cups of coffee and 1 soda per day     Review of Systems  Constitutional: Positive for chills. Negative for fever, weight loss and malaise/fatigue.  HENT: Negative.   Eyes: Negative.   Respiratory: Positive for cough. Negative for shortness of breath and wheezing.   Cardiovascular: Negative for chest pain and palpitations.  Gastrointestinal: Positive for nausea and abdominal pain. Negative for heartburn, vomiting, diarrhea and constipation.  Genitourinary: Negative.   Musculoskeletal: Negative.   Skin: Negative.   Neurological: Negative.  Negative for weakness.  Psychiatric/Behavioral: Negative.      PHYSICAL EXAM: BP 178/99 mmHg  Pulse 110  Temp(Src) 99.2 F (37.3 C) (Oral)  Resp 20  Ht 5\' 3"  (1.6 m)  Wt 94.348 kg (208 lb)  BMI 36.85 kg/m2  SpO2 96%  Physical Exam  Constitutional: She is oriented to person, place, and time. She  appears well-developed and well-nourished. No distress.  She does appear a bit flushed  HENT:  Head: Normocephalic and atraumatic.  Eyes: EOM are normal. Pupils are equal, round, and reactive to light.  Neck: Normal range of motion. Neck supple.  Cardiovascular: Regular rhythm and normal heart sounds.   She does have an elevated heart rate  Pulmonary/Chest: Effort normal and breath sounds normal.  Abdominal: Soft. Bowel sounds are normal. She exhibits no distension. There is tenderness. There is guarding. There is no rebound.  Musculoskeletal: Normal range of motion. She exhibits no  edema or tenderness.  Neurological: She is alert and oriented to person, place, and time.  Skin: Skin is warm and dry. She is not diaphoretic.  Psychiatric: Her behavior is normal. Judgment and thought content normal.   Her abdomen is not distended but markedly tender right lower quadrant with guarding. She does not have any rebound. She has hypoactive but present bowel sounds  Impression/Plan: I have independently reviewed her CT scan. With her clinical presentation and laboratory values and imaging findings I agree with the diagnosis of acute appendicitis. We discussed the options for intervention with her including antibiotic therapy. She has agreed to proceed with surgical intervention. Risks benefits and options of surgery been outlined to her in detail. She is in agreement. We will better the hospital awaiting an open operating room to perform her surgery. My evening associate will be in charge of her surgical intervention. I have discussed this plan with the patient   Dia Crawford III, MD  10/30/2015, 5:21 PM

## 2015-10-30 NOTE — ED Notes (Signed)
Pt transported to room 208 

## 2015-10-30 NOTE — ED Provider Notes (Signed)
CT abdomen pelvis with contrast:  IMPRESSION: 1. Markedly thickened, inflamed appendix with periappendiceal edema/ inflammation. Imaging features consistent with acute appendicitis. 2. High attenuation in the fundus of the gallbladder may be related to stones trapped behind a gallbladder fold. This etiology is favored for the appearance given that there does not appear to be any washout in this region on renal delay imaging, but soft tissue thickening related to adenomyomatosis or neoplasm cannot be entirely excluded. MRI without and with contrast may prove helpful to further evaluate. Imaging findings of potential clinical significance:  Abdominal Aortic Atherosclerois (ICD10-170.0)  1.   Electronically Signed By: Misty Stanley M.D. On: 10/30/2015 16:35    I accepted care from Dr. Jimmye Norman. CT scan shows acute appendicitis, I discussed this with the radiologist. Consult to surgery for admission.  Diagnosis:  Acute appendicitis Disposition : Admit to general Surgery  Lisa Roca, MD 10/30/15 [phone]

## 2015-10-30 NOTE — Telephone Encounter (Signed)
Will await ER notes  

## 2015-10-30 NOTE — Anesthesia Preprocedure Evaluation (Signed)
Anesthesia Evaluation  Patient identified by MRN, date of birth, ID band Patient awake    Reviewed: Allergy & Precautions, NPO status , Patient's Chart, lab work & pertinent test results  Airway Mallampati: II       Dental  (+) Teeth Intact   Pulmonary sleep apnea ,    breath sounds clear to auscultation + decreased breath sounds      Cardiovascular Exercise Tolerance: Good hypertension, Pt. on medications and Pt. on home beta blockers  Rhythm:Regular     Neuro/Psych    GI/Hepatic Neg liver ROS, GERD  Medicated,  Endo/Other  diabetes, Type 1, Insulin DependentMorbid obesity  Renal/GU negative Renal ROS     Musculoskeletal   Abdominal (+) + obese,   Peds  Hematology   Anesthesia Other Findings   Reproductive/Obstetrics                             Anesthesia Physical Anesthesia Plan  ASA: III  Anesthesia Plan: General   Post-op Pain Management:    Induction: Intravenous  Airway Management Planned: Oral ETT  Additional Equipment:   Intra-op Plan:   Post-operative Plan: Extubation in OR  Informed Consent: I have reviewed the patients History and Physical, chart, labs and discussed the procedure including the risks, benefits and alternatives for the proposed anesthesia with the patient or authorized representative who has indicated his/her understanding and acceptance.     Plan Discussed with: CRNA  Anesthesia Plan Comments:         Anesthesia Quick Evaluation

## 2015-10-30 NOTE — Anesthesia Procedure Notes (Signed)
Procedure Name: Intubation Date/Time: 10/30/2015 9:20 PM Performed by: Sinda Du Pre-anesthesia Checklist: Patient identified, Patient being monitored, Timeout performed, Emergency Drugs available and Suction available Patient Re-evaluated:Patient Re-evaluated prior to inductionOxygen Delivery Method: Circle system utilized Preoxygenation: Pre-oxygenation with 100% oxygen Intubation Type: IV induction Ventilation: Mask ventilation without difficulty Laryngoscope Size: Mac and 3 Grade View: Grade I Tube type: Oral Tube size: 7.0 mm Number of attempts: 1 Airway Equipment and Method: Stylet Placement Confirmation: ETT inserted through vocal cords under direct vision,  positive ETCO2 and breath sounds checked- equal and bilateral Secured at: 21 cm Tube secured with: Tape Dental Injury: Teeth and Oropharynx as per pre-operative assessment

## 2015-10-31 ENCOUNTER — Encounter: Payer: Self-pay | Admitting: Surgery

## 2015-10-31 DIAGNOSIS — K353 Acute appendicitis with localized peritonitis: Principal | ICD-10-CM

## 2015-10-31 LAB — CBC
HEMATOCRIT: 34.1 % — AB (ref 35.0–47.0)
Hemoglobin: 11.7 g/dL — ABNORMAL LOW (ref 12.0–16.0)
MCH: 29.4 pg (ref 26.0–34.0)
MCHC: 34.3 g/dL (ref 32.0–36.0)
MCV: 85.7 fL (ref 80.0–100.0)
Platelets: 304 10*3/uL (ref 150–440)
RBC: 3.98 MIL/uL (ref 3.80–5.20)
RDW: 13.9 % (ref 11.5–14.5)
WBC: 21.8 10*3/uL — ABNORMAL HIGH (ref 3.6–11.0)

## 2015-10-31 LAB — BASIC METABOLIC PANEL
Anion gap: 7 (ref 5–15)
BUN: 12 mg/dL (ref 6–20)
CALCIUM: 8.2 mg/dL — AB (ref 8.9–10.3)
CO2: 26 mmol/L (ref 22–32)
Chloride: 100 mmol/L — ABNORMAL LOW (ref 101–111)
Creatinine, Ser: 0.64 mg/dL (ref 0.44–1.00)
GFR calc Af Amer: >60 mL/min (ref >60)
GLUCOSE: 317 mg/dL — AB (ref 65–99)
POTASSIUM: 4.3 mmol/L (ref 3.5–5.1)
Sodium: 133 mmol/L — ABNORMAL LOW (ref 135–145)

## 2015-10-31 LAB — GLUCOSE, CAPILLARY
GLUCOSE-CAPILLARY: 167 mg/dL — AB (ref 65–99)
GLUCOSE-CAPILLARY: 216 mg/dL — AB (ref 65–99)
GLUCOSE-CAPILLARY: 277 mg/dL — AB (ref 65–99)
GLUCOSE-CAPILLARY: 314 mg/dL — AB (ref 65–99)
GLUCOSE-CAPILLARY: 340 mg/dL — AB (ref 65–99)
GLUCOSE-CAPILLARY: 355 mg/dL — AB (ref 65–99)
Glucose-Capillary: 205 mg/dL — ABNORMAL HIGH (ref 65–99)
Glucose-Capillary: 297 mg/dL — ABNORMAL HIGH (ref 65–99)

## 2015-10-31 LAB — HEMOGLOBIN AND HEMATOCRIT, BLOOD
HCT: 28.5 % — ABNORMAL LOW (ref 35.0–47.0)
HEMATOCRIT: 23.3 % — AB (ref 35.0–47.0)
HEMOGLOBIN: 9.6 g/dL — AB (ref 12.0–16.0)
HEMOGLOBIN: 7.9 g/dL — AB (ref 12.0–16.0)

## 2015-10-31 LAB — ABO/RH: ABO/RH(D): A POS

## 2015-10-31 LAB — PREPARE RBC (CROSSMATCH)

## 2015-10-31 MED ORDER — SODIUM CHLORIDE 0.9 % IV BOLUS (SEPSIS)
1000.0000 mL | Freq: Once | INTRAVENOUS | Status: AC
  Administered 2015-10-31: 1000 mL via INTRAVENOUS

## 2015-10-31 MED ORDER — POTASSIUM CHLORIDE IN NACL 20-0.45 MEQ/L-% IV SOLN
INTRAVENOUS | Status: DC
  Administered 2015-10-31: 02:00:00 via INTRAVENOUS
  Filled 2015-10-31 (×5): qty 1000

## 2015-10-31 MED ORDER — OXYCODONE HCL 5 MG PO TABS: 5.0000 mg | ORAL_TABLET | ORAL | Status: DC | PRN

## 2015-10-31 MED ORDER — SODIUM CHLORIDE 0.9 % IV SOLN
Freq: Once | INTRAVENOUS | Status: AC
  Administered 2015-10-31: 21:00:00 via INTRAVENOUS

## 2015-10-31 MED ORDER — ONDANSETRON 8 MG PO TBDP: 4.0000 mg | ORAL_TABLET | ORAL | Status: DC | PRN

## 2015-10-31 MED ORDER — ONDANSETRON HCL 4 MG/2ML IJ SOLN: 4.0000 mg | INTRAMUSCULAR | Status: DC | PRN

## 2015-10-31 MED ORDER — FENTANYL CITRATE (PF) 100 MCG/2ML IJ SOLN
25.0000 ug | INTRAMUSCULAR | Status: DC | PRN
Start: 2015-10-31 — End: 2015-10-31

## 2015-10-31 MED ORDER — INSULIN ASPART 100 UNIT/ML ~~LOC~~ SOLN
0.0000 [IU] | SUBCUTANEOUS | Status: DC
  Administered 2015-10-31: 7 [IU] via SUBCUTANEOUS
  Administered 2015-10-31: 11 [IU] via SUBCUTANEOUS
  Administered 2015-10-31: 4 [IU] via SUBCUTANEOUS
  Administered 2015-10-31: 20 [IU] via SUBCUTANEOUS
  Administered 2015-10-31: 7 [IU] via SUBCUTANEOUS
  Administered 2015-10-31 (×2): 15 [IU] via SUBCUTANEOUS
  Administered 2015-11-01: 4 [IU] via SUBCUTANEOUS
  Administered 2015-11-01 (×3): 3 [IU] via SUBCUTANEOUS
  Administered 2015-11-01: 4 [IU] via SUBCUTANEOUS
  Administered 2015-11-02: 3 [IU] via SUBCUTANEOUS
  Administered 2015-11-02 (×2): 1 [IU] via SUBCUTANEOUS
  Administered 2015-11-02: 3 [IU] via SUBCUTANEOUS
  Filled 2015-10-31: qty 15
  Filled 2015-10-31: qty 20
  Filled 2015-10-31: qty 3
  Filled 2015-10-31: qty 15
  Filled 2015-10-31: qty 11
  Filled 2015-10-31: qty 7
  Filled 2015-10-31: qty 1
  Filled 2015-10-31 (×4): qty 3
  Filled 2015-10-31: qty 4
  Filled 2015-10-31: qty 1
  Filled 2015-10-31: qty 4
  Filled 2015-10-31: qty 7
  Filled 2015-10-31: qty 4

## 2015-10-31 MED ORDER — KETOROLAC TROMETHAMINE 30 MG/ML IJ SOLN
30.0000 mg | Freq: Four times a day (QID) | INTRAMUSCULAR | Status: DC
  Administered 2015-10-31 – 2015-11-02 (×8): 30 mg via INTRAVENOUS
  Filled 2015-10-31 (×9): qty 1

## 2015-10-31 MED ORDER — SODIUM CHLORIDE 0.9 % IR SOLN
Status: DC | PRN
  Administered 2015-10-31: 200 mL

## 2015-10-31 MED ORDER — FAMOTIDINE IN NACL 20-0.9 MG/50ML-% IV SOLN
20.0000 mg | Freq: Two times a day (BID) | INTRAVENOUS | Status: DC
  Administered 2015-10-31 – 2015-11-01 (×4): 20 mg via INTRAVENOUS
  Filled 2015-10-31 (×5): qty 50

## 2015-10-31 MED ORDER — HYDROMORPHONE HCL 1 MG/ML IJ SOLN: 1.0000 mg | INTRAMUSCULAR | Status: DC | PRN

## 2015-10-31 MED ORDER — ONDANSETRON HCL 4 MG/2ML IJ SOLN: 4.0000 mg | Freq: Once | INTRAMUSCULAR | Status: DC | PRN

## 2015-10-31 MED ORDER — CYCLOBENZAPRINE HCL 10 MG PO TABS
10.0000 mg | ORAL_TABLET | Freq: Three times a day (TID) | ORAL | Status: DC
  Administered 2015-10-31 – 2015-11-03 (×12): 10 mg via ORAL
  Filled 2015-10-31 (×12): qty 1

## 2015-10-31 MED ORDER — ACETAMINOPHEN 500 MG PO TABS
1000.0000 mg | ORAL_TABLET | Freq: Four times a day (QID) | ORAL | Status: DC
Start: 2015-10-31 — End: 2015-11-04
  Administered 2015-10-31 – 2015-11-03 (×11): 1000 mg via ORAL
  Filled 2015-10-31 (×15): qty 2

## 2015-10-31 MED ORDER — HYDRALAZINE HCL 20 MG/ML IJ SOLN: 10.0000 mg | INTRAMUSCULAR | Status: DC | PRN

## 2015-10-31 MED ORDER — ENOXAPARIN SODIUM 40 MG/0.4ML ~~LOC~~ SOLN
40.0000 mg | SUBCUTANEOUS | Status: DC
  Administered 2015-11-01: 40 mg via SUBCUTANEOUS
  Filled 2015-10-31: qty 0.4

## 2015-10-31 MED ORDER — SODIUM CHLORIDE 0.9 % IV BOLUS (SEPSIS)
500.0000 mL | Freq: Once | INTRAVENOUS | Status: AC
  Administered 2015-10-31: 500 mL via INTRAVENOUS

## 2015-10-31 MED ORDER — INSULIN GLARGINE 100 UNIT/ML ~~LOC~~ SOLN
12.0000 [IU] | Freq: Every day | SUBCUTANEOUS | Status: DC
  Administered 2015-11-01 – 2015-11-03 (×3): 12 [IU] via SUBCUTANEOUS
  Filled 2015-10-31 (×4): qty 0.12

## 2015-10-31 MED ORDER — SODIUM CHLORIDE 0.9 % IV SOLN
INTRAVENOUS | Status: DC
  Administered 2015-10-31 – 2015-11-02 (×5): via INTRAVENOUS

## 2015-10-31 NOTE — Transfer of Care (Signed)
Immediate Anesthesia Transfer of Care Note  Patient: Alexis [name]  Procedure(s) Performed: Procedure(s): APPENDECTOMY LAPAROSCOPIC, lysis of adhesions (N/A)  Patient Location: PACU  Anesthesia Type:General  Level of Consciousness: awake, alert  and oriented  Airway & Oxygen Therapy: Patient Spontanous Breathing and Patient connected to face mask oxygen  Post-op Assessment: Report given to RN  Post vital signs: Reviewed and stable  Last Vitals:  Filed Vitals:   10/30/15 1934 10/31/15 0020  BP: 173/75 148/80  Pulse: 111 89  Temp:  36.9 C  Resp: 16 14    Last Pain:  Filed Vitals:   10/31/15 0024  PainSc: 4          Complications: No apparent anesthesia complications

## 2015-10-31 NOTE — Progress Notes (Signed)
Subjective:   I have reviewed her findings from last evening. She had acute appendicitis but her laparoscopic appendectomy was complicated by significant abdominal adhesions. She had some difficulty overnight with labile blood pressure. Her hemoglobin is stable this morning. She has some mild abdominal discomfort. She has no nausea or vomiting.  She had 390 cc of blood out her drains last night and so far this morning in 90 minutes on the Sheriff she's had 130 cc of dark blood out. She remains hemodynamically labile.  Vital signs in last 24 hours: Temp:  [93.6 F (34.2 C)-99.2 F (37.3 C)] 93.6 F (34.2 C) (07/15 0142) Pulse Rate:  [87-114] 111 (07/15 0142) Resp:  [13-20] 18 (07/15 0142) BP: (90-180)/(54-99) 100/56 mmHg (07/15 0613) SpO2:  [88 %-97 %] 96 % (07/15 0142) Weight:  [94.348 kg (208 lb)] 94.348 kg (208 lb) (07/14 1101) Last BM Date: 10/30/15  Intake/Output from previous day: 07/14 0701 - 07/15 0700 In: 2689 [I.V.:2639; IV Piggyback:50] Out: 1630 [Urine:1000; Drains:390; Blood:200]  Exam:  Her abdomen is generally soft with some incisional tenderness. She does have good bowel sounds.  Lab Results:  CBC  Recent Labs  10/30/15 1107 10/31/15 0341  WBC 15.7* 21.8*  HGB 14.3 11.7*  HCT 42.9 34.1*  PLT 281 304   CMP     Component Value Date/Time   NA 133* 10/31/2015 0341   NA 141 07/29/2010   K 4.3 10/31/2015 0341   CL 100* 10/31/2015 0341   CO2 26 10/31/2015 0341   GLUCOSE 317* 10/31/2015 0341   BUN 12 10/31/2015 0341   BUN 10 07/29/2010   CREATININE 0.64 10/31/2015 0341   CREATININE 0.6 07/29/2010   CALCIUM 8.2* 10/31/2015 0341   PROT 7.7 10/30/2015 1107   ALBUMIN 3.9 10/30/2015 1107   AST 18 10/30/2015 1107   ALT 13* 10/30/2015 1107   ALKPHOS 95 10/30/2015 1107   BILITOT 0.8 10/30/2015 1107   GFRNONAA >60 10/31/2015 0341   GFRAA >60 10/31/2015 0341   PT/INR No results for input(s): LABPROT, INR in the last 72 hours.  Studies/Results: Ct Abdomen  Pelvis W Contrast  10/30/2015  ADDENDUM REPORT: 10/30/2015 16:47 ADDENDUM: Critical Value/emergent results were called by telephone at the time of interpretation on 10/30/2015 at 4:47 pm to Dr. Reita Cliche , who verbally acknowledged these results. Electronically Signed   By: Misty Stanley M.D.   On: 10/30/2015 16:47  10/30/2015  CLINICAL DATA:  Generalized abdominal pain but worse in the right lower quadrant since yesterday. EXAM: CT ABDOMEN AND PELVIS WITH CONTRAST TECHNIQUE: Multidetector CT imaging of the abdomen and pelvis was performed using the standard protocol following bolus administration of intravenous contrast. CONTRAST:  146mL ISOVUE-300 IOPAMIDOL (ISOVUE-300) INJECTION 61% COMPARISON:  None. FINDINGS: Lower chest:  Unremarkable. Hepatobiliary: No focal abnormality within the liver parenchyma. There appears to be a Phrygian cap of the gallbladder fundus with dependent high density material. This could be wall thickening related to adenomyomatosis or stones layering in the fundus. Enhancing soft tissue mass is considered less likely. No intrahepatic or extrahepatic biliary dilation. Pancreas: No focal mass lesion. No dilatation of the main duct. No intraparenchymal cyst. No peripancreatic edema. Spleen: No splenomegaly. No focal mass lesion. Adrenals/Urinary Tract: No adrenal nodule or mass. The kidneys are normal in appearance. No evidence for hydroureter. The urinary bladder appears normal for the degree of distention. Stomach/Bowel: Stomach is nondistended. No gastric wall thickening. No evidence of outlet obstruction. Duodenum diverticulum noted. Duodenum is normally positioned as is the ligament  of Treitz. No small bowel wall thickening. No small bowel dilatation. The terminal ileum is normal. The appendix is dilated up to 17 mm diameter with substantial periappendiceal edema/inflammation. High attenuation material at the base of the appendix may represent appendicolith. There is some wall thickening in  the cecum at the base of the appendix. No gross colonic mass. No colonic wall thickening. No substantial diverticular change. Vascular/Lymphatic: There is abdominal aortic atherosclerosis without aneurysm. Portal vein and superior mesenteric vein are patent. There is no gastrohepatic or hepatoduodenal ligament lymphadenopathy. No intraperitoneal or retroperitoneal lymphadenopathy. No pelvic sidewall lymphadenopathy. Reproductive: The uterus is surgically absent. There is no adnexal mass. Other: Small volume intraperitoneal free fluid noted in the right para colic gutter. Musculoskeletal: Bone windows reveal no worrisome lytic or sclerotic osseous lesions. IMPRESSION: 1. Markedly thickened, inflamed appendix with periappendiceal edema/ inflammation. Imaging features consistent with acute appendicitis. 2. High attenuation in the fundus of the gallbladder may be related to stones trapped behind a gallbladder fold. This etiology is favored for the appearance given that there does not appear to be any washout in this region on renal delay imaging, but soft tissue thickening related to adenomyomatosis or neoplasm cannot be entirely excluded. MRI without and with contrast may prove helpful to further evaluate. Imaging findings of potential clinical significance: Abdominal Aortic Atherosclerois (ICD10-170.0) 1. Electronically Signed: By: Misty Stanley M.D. On: 10/30/2015 16:35    Assessment/Plan: I'm very concerned about possibility of persistent bleeding particularly with extensive lysis of adhesions and the mesenteric of bleeding from the dissection and mesoappendix. If she continues to have blood at this rate we will certainly need to consider a second look laparoscopy for control. I'll set her up for a another hemoglobin at noon with a type and screen and then keep her nothing by mouth in the interim. I discussed this plan with the patient and she is in agreement.

## 2015-10-31 NOTE — Progress Notes (Signed)
Pt complained of being light headed when getting up to the Bedside commode, drainage from JP 300, BP 81/48. Notified Dr. Azalee Course and advised to administer a bolus of NS. Bolus infusing and follow-up BP pending. Villa Herb RN-BC

## 2015-10-31 NOTE — Progress Notes (Addendum)
First unit of blood transfusion is completed. Current BP 108/60, temp 98.2, pulse 81, o2 100. Pt is resting comfortably.  Angus Seller

## 2015-10-31 NOTE — Progress Notes (Signed)
Post bolus BP is 100/56, JP drainage 390 since 0400. Notified Dr. Azalee Course, no new orders at this time. Villa Herb RN-BC

## 2015-10-31 NOTE — Progress Notes (Signed)
Vital signs are more stable this afternoon with a blood pressure 115/60 heart rate down now to the low 90s. She has made some urine and she's not nearly as lightheaded. She drained approximately 300 cc of dark bloody material since 7:00 this morning. Hemoglobin is pending. We will continue to follow her closely but at the present time I do not anticipate returning her to surgery unless she has significant change in her vital signs or hemoglobin. I discussed this with the patient and her family.

## 2015-10-31 NOTE — Care Management Obs Status (Signed)
MEDICARE OBSERVATION STATUS NOTIFICATION   Patient Details  Name: CLO ALDI MRN: [id number] Date of Birth: 11/12/1950   Medicare Observation Status Notification Given:  Yes (Discussed at length with husband who stated "If I don't have to sign then I won't."  Explained that signature is for confirmation that he is aware of Mrs Soden Observation status and what that means. )    Nakeyia Menden A, RN 10/31/2015, 3:27 PM

## 2015-10-31 NOTE — Progress Notes (Signed)
MEDICATION RELATED CONSULT NOTE - INITIAL   Pharmacy Consult for Lovenox, Ketorolac  Indication:  Concern over blood loss   No Known Allergies  Patient Measurements: Height: 5\' 3"  (160 cm) Weight: 208 lb (94.348 kg) IBW/kg (Calculated) : 52.4 Adjusted Body Weight:   Vital Signs: Temp: 98.7 F (37.1 C) (07/15 1500) BP: 110/64 mmHg (07/15 1500) Pulse Rate: 96 (07/15 1500) Intake/Output from previous day: 07/14 0701 - 07/15 0700 In: 2689 [I.V.:2639; IV Piggyback:50] Out: 1630 [Urine:1000; Drains:390; Blood:200] Intake/Output from this shift:    Labs:  Recent Labs  10/30/15 1107 10/31/15 0341 10/31/15 1241 10/31/15 1754  WBC 15.7* 21.8*  --   --   HGB 14.3 11.7* 9.6* 7.9*  HCT 42.9 34.1* 28.5* 23.3*  PLT 281 304  --   --   CREATININE 0.66 0.64  --   --   ALBUMIN 3.9  --   --   --   PROT 7.7  --   --   --   AST 18  --   --   --   ALT 13*  --   --   --   ALKPHOS 95  --   --   --   BILITOT 0.8  --   --   --    Estimated Creatinine Clearance: 76.6 mL/min (by C-G formula based on Cr of 0.64).   Microbiology: Recent Results (from the past 720 hour(s))  MRSA PCR Screening     Status: Abnormal   Collection Time: 10/30/15  7:50 PM  Result Value Ref Range Status   MRSA by PCR POSITIVE (A) NEGATIVE Final    Comment:        The GeneXpert MRSA Assay (FDA approved for NASAL specimens only), is one component of a comprehensive MRSA colonization surveillance program. It is not intended to diagnose MRSA infection nor to guide or monitor treatment for MRSA infections. RESULT CALLED TO, READ BACK BY AND VERIFIED WITH: Villa Herb AT 2058 10/30/15.PMH     Medical History: Past Medical History  Diagnosis Date  . GERD (gastroesophageal reflux disease)   . Allergy   . Hyperlipidemia   . Hx of colonic polyps   . Blood in stool   . Asthma     in childhood  . Diabetes mellitus     dx'd at age ~6  . Hypertension     dx'd at age ~47  . Tremor     only in face;  worse under periods of stress. father with tremor also  . Sleep apnea     on CPAP    Medications:  Scheduled:  . sodium chloride   Intravenous Once  . acetaminophen  1,000 mg Oral Q6H  . cyclobenzaprine  10 mg Oral TID  . [START ON 11/01/2015] enoxaparin (LOVENOX) injection  40 mg Subcutaneous Q24H  . famotidine (PEPCID) IV  20 mg Intravenous Q12H  . insulin aspart  0-20 Units Subcutaneous Q4H  . insulin glargine  12 Units Subcutaneous Daily  . ketorolac  30 mg Intravenous Q6H  . piperacillin-tazobactam (ZOSYN)  IV  3.375 g Intravenous Q8H    Assessment: 7/15  @ 18:00 :   Hct = 23.3 ,  Hgb = 7.9   Goal of Therapy:    Plan:  Spoke with Dr Azalee Course regarding apparent blood loss.  MD states she has just ordered two units of blood for this pt .  MD wants to continue both ketorolac and lovenox.   Casy Tavano D 10/31/2015,7:30 PM

## 2015-10-31 NOTE — Progress Notes (Signed)
65 yr old female POD#1 from Laparoscopic appendectomy and extensive lysis of adhesions.  She states she's feeling ok, still fatigued. Patient states that she still has some lightheadedness and dizziness especially when up to bathroom.  She denies any nausea.  She had 200cc out of the first part of the day and then only 75cc since noon.   Filed Vitals:   10/31/15 1221 10/31/15 1500  BP: 115/68 110/64  Pulse: 94 96  Temp: 98.2 F (36.8 C) 98.7 F (37.1 C)  Resp:  18   I/O last 3 completed shifts: In: [id number] [I.V.:3890; IV Piggyback:600] Out: 1935 [Urine:1000; Drains:695; Other:40; Blood:200]     PE:  Gen: NAD Abd: soft, appropriately tender, incisions c/d/i, LLQ drain site with some blood surrounding drain, sanginous dark output in JP 20cc    CBC Latest Ref Rng 10/31/2015 10/31/2015 10/31/2015  WBC 3.6 - 11.0 K/uL - - 21.8(H)  Hemoglobin 12.0 - 16.0 g/dL 7.9(L) 9.6(L) 11.7(L)  Hematocrit 35.0 - 47.0 % 23.3(L) 28.5(L) 34.1(L)  Platelets 150 - 440 K/uL - - 304    CMP Latest Ref Rng 10/31/2015 10/30/2015 05/29/2015  Glucose 65 - 99 mg/dL 317(H) 237(H) 333(H)  BUN 6 - 20 mg/dL 12 12 14   Creatinine 0.44 - 1.00 mg/dL 0.64 0.66 0.62  Sodium 135 - 145 mmol/L 133(L) 136 138  Potassium 3.5 - 5.1 mmol/L 4.3 4.7 4.1  Chloride 101 - 111 mmol/L 100(L) 100(L) 101  CO2 22 - 32 mmol/L 26 27 31   Calcium 8.9 - 10.3 mg/dL 8.2(L) 9.6 10.1  Total Protein 6.5 - 8.1 g/dL - 7.7 8.2  Total Bilirubin 0.3 - 1.2 mg/dL - 0.8 0.4  Alkaline Phos 38 - 126 U/L - 95 89  AST 15 - 41 U/L - 18 15  ALT 14 - 54 U/L - 13(L) 14    A/P:  65 yr old female POD#1 from Laparoscopic appendectomy and extensive lysis of adhesions.  Acute blood loss anemia: JP output has now slowed down significantly but with a decrease in Hg to 7.9  Will transfuse her 2 units tonight.  I did discussed the risks and benefits of blood transfusion with the patient and family and they are in agreement.  If she were to start putting large amounts out  of the JP >200cc/hr, or if she were to become hemodynamically unstable will likely have to take her to the OR, however, with the vital signs and drainage output decreasing hopefully will not be needed.   GI: will continue NPO until tomorrow AM, if remains stable overnight can likely give her diet then   DM: hyperglycemia in the 200s-300s, appreciate medicine help, they may start her on some lantus for long acting coverage  HTN: holding home BP meds for now, appreciate medicine help

## 2015-10-31 NOTE — Progress Notes (Signed)
Second unit of RBC transfusion was started at this time. Pre transfusion Vital signs were stable, will stay with pt for the first 15 minutes and recheck VS. Pt is resting comfortably.  Alexis [name]

## 2015-10-31 NOTE — Consult Note (Signed)
[facility] [place] NAME: Alexis [name]    MR#:  [id number]  DATE OF BIRTH:  January 13, 1951  DATE OF ADMISSION:  10/30/2015  PRIMARY CARE PHYSICIAN: Elsie Stain, MD   REQUESTING/REFERRING PHYSICIAN: Dr. Bronson Ing  CHIEF COMPLAINT:   Chief Complaint  Patient presents with  . Abdominal Pain    HISTORY OF PRESENT ILLNESS:  Alexis [name]  is a 65 y.o. female with a known history of Hypertension, insulin-dependent diabetes mellitus, sleep apnea presented to the hospital secondary to acute appendicitis. Patient had laparoscopic appendectomy done on [id number], was complicated by increased bleeding from her postoperative drains. Patient complains of abdominal pain and nausea. No vomiting so diarrhea. Denies any chest pain, cough fevers or chills. Blood sugars have been elevated in the 300 range. Blood pressures have been low normal overnight. Medical consult requested for medical management.  PAST MEDICAL HISTORY:   Past Medical History  Diagnosis Date  . GERD (gastroesophageal reflux disease)   . Allergy   . Hyperlipidemia   . Hx of colonic polyps   . Blood in stool   . Asthma     in childhood  . Diabetes mellitus     dx'd at age ~51  . Hypertension     dx'd at age ~34  . Tremor     only in face; worse under periods of stress. father with tremor also  . Sleep apnea     on CPAP    PAST SURGICAL HISTOIRY:   Past Surgical History  Procedure Laterality Date  . Tonsillectomy and adenoidectomy  1957  . Abdominal hysterectomy  1988    Surgery included removal of benign pelvic tumor    SOCIAL HISTORY:   Social History  Substance Use Topics  . Smoking status: Never Smoker   . Smokeless tobacco: Never Used  . Alcohol Use: No    FAMILY HISTORY:   Family History  Problem Relation Age of Onset  . Heart disease Mother     h/o valve replacement, no h/o MI  . Colon cancer Father   . Colon cancer Sister     metastatic  (liver?) cancer  . Uterine cancer Sister   . Lung cancer Maternal Grandfather     DRUG ALLERGIES:  No Known Allergies  REVIEW OF SYSTEMS:   ROS  CONSTITUTIONAL: No fever, fatigue or weakness.  EYES: No blurred or double vision.  EARS, NOSE, AND THROAT: No tinnitus or ear pain.  RESPIRATORY: No cough, shortness of breath, wheezing or hemoptysis.  CARDIOVASCULAR: No chest pain, orthopnea, edema.  GASTROINTESTINAL: No nausea, vomiting, diarrhea or abdominal pain.  GENITOURINARY: No dysuria, hematuria.  ENDOCRINE: No polyuria, nocturia,  HEMATOLOGY: No anemia, easy bruising or bleeding SKIN: No rash or lesion. MUSCULOSKELETAL: No joint pain or arthritis.   NEUROLOGIC: No tingling, numbness, weakness.  PSYCHIATRY: No anxiety or depression.   MEDICATIONS AT HOME:   Prior to Admission medications   Medication Sig Start Date End Date Taking? Authorizing Provider  acetaminophen (TYLENOL) 500 MG tablet Take 500 mg by mouth as needed. Take 1-2 at night for pain   Yes Historical Provider, MD  aspirin 81 MG tablet Take 81 mg by mouth daily.     Yes Historical Provider, MD  enalapril (VASOTEC) 20 MG tablet Take 1 tablet (20 mg total) by mouth daily. 05/29/15  Yes [name], MD  insulin NPH-regular Human (NOVOLIN 70/30) (70-30) 100 UNIT/ML injection 28 units in AM and 18 units in  PM 08/31/15  Yes [name], MD  Insulin Syringe-Needle U-100 (RELION INSULIN SYR 0.5CC/30G) 30G X 5/16" 0.5 ML MISC Use to inject insulin twice daily as directed.  Diagnosis: E11.9.  Insulin dependent. 05/29/15  Yes [name], MD  metFORMIN (GLUCOPHAGE) 1000 MG tablet Take 1 tablet (1,000 mg total) by mouth 2 (two) times daily with a meal. 05/29/15  Yes [name], MD  propranolol (INDERAL) 10 MG tablet Take 1 tablet (10 mg total) by mouth 2 (two) times daily. 08/31/15  Yes [name], MD      VITAL SIGNS:  Blood pressure 115/68, pulse 94, temperature 98.2 F (36.8 C), temperature source  Oral, resp. rate 18, height 5\' 3"  (1.6 m), weight 94.348 kg (208 lb), SpO2 100 %.  PHYSICAL EXAMINATION:   Physical Exam  GENERAL:  65 y.o.-year-old patient lying in the bed with no acute distress.  EYES: Pupils equal, round, reactive to light and accommodation. No scleral icterus. Extraocular muscles intact.  HEENT: Head atraumatic, normocephalic. Oropharynx and nasopharynx clear.  NECK:  Supple, no jugular venous distention. No thyroid enlargement, no tenderness.  LUNGS: Normal breath sounds bilaterally, no wheezing, rales,rhonchi or crepitation. No use of accessory muscles of respiration.  CARDIOVASCULAR: S1, S2 normal. No murmurs, rubs, or gallops.  ABDOMEN: Soft, nontender, nondistended. Bowel sounds present. No organomegaly or mass.  EXTREMITIES: No pedal edema, cyanosis, or clubbing.  NEUROLOGIC: Cranial nerves II through XII are intact. Muscle strength 5/5 in all extremities. Sensation intact. Gait not checked.  PSYCHIATRIC: The patient is alert and oriented x 3.  SKIN: No obvious rash, lesion, or ulcer.   LABORATORY PANEL:   CBC  Recent Labs Lab 10/31/15 0341 10/31/15 1241  WBC 21.8*  --   HGB 11.7* 9.6*  HCT 34.1* 28.5*  PLT 304  --    ------------------------------------------------------------------------------------------------------------------  Chemistries   Recent Labs Lab 10/30/15 1107 10/31/15 0341  NA 136 133*  K 4.7 4.3  CL 100* 100*  CO2 27 26  GLUCOSE 237* 317*  BUN 12 12  CREATININE 0.66 0.64  CALCIUM 9.6 8.2*  AST 18  --   ALT 13*  --   ALKPHOS 95  --   BILITOT 0.8  --    ------------------------------------------------------------------------------------------------------------------  Cardiac Enzymes No results for input(s): TROPONINI in the last 168 hours. ------------------------------------------------------------------------------------------------------------------  RADIOLOGY:  Ct Abdomen Pelvis W Contrast  10/30/2015  ADDENDUM  REPORT: 10/30/2015 16:47 ADDENDUM: Critical Value/emergent results were called by telephone at the time of interpretation on 10/30/2015 at 4:47 pm to Dr. Reita Cliche , who verbally acknowledged these results. Electronically Signed   By: Misty Stanley M.D.   On: 10/30/2015 16:47  10/30/2015  CLINICAL DATA:  Generalized abdominal pain but worse in the right lower quadrant since yesterday. EXAM: CT ABDOMEN AND PELVIS WITH CONTRAST TECHNIQUE: Multidetector CT imaging of the abdomen and pelvis was performed using the standard protocol following bolus administration of intravenous contrast. CONTRAST:  117mL ISOVUE-300 IOPAMIDOL (ISOVUE-300) INJECTION 61% COMPARISON:  None. FINDINGS: Lower chest:  Unremarkable. Hepatobiliary: No focal abnormality within the liver parenchyma. There appears to be a Phrygian cap of the gallbladder fundus with dependent high density material. This could be wall thickening related to adenomyomatosis or stones layering in the fundus. Enhancing soft tissue mass is considered less likely. No intrahepatic or extrahepatic biliary dilation. Pancreas: No focal mass lesion. No dilatation of the main duct. No intraparenchymal cyst. No peripancreatic edema. Spleen: No splenomegaly. No focal mass lesion. Adrenals/Urinary Tract: No adrenal  nodule or mass. The kidneys are normal in appearance. No evidence for hydroureter. The urinary bladder appears normal for the degree of distention. Stomach/Bowel: Stomach is nondistended. No gastric wall thickening. No evidence of outlet obstruction. Duodenum diverticulum noted. Duodenum is normally positioned as is the ligament of Treitz. No small bowel wall thickening. No small bowel dilatation. The terminal ileum is normal. The appendix is dilated up to 17 mm diameter with substantial periappendiceal edema/inflammation. High attenuation material at the base of the appendix may represent appendicolith. There is some wall thickening in the cecum at the base of the appendix. No  gross colonic mass. No colonic wall thickening. No substantial diverticular change. Vascular/Lymphatic: There is abdominal aortic atherosclerosis without aneurysm. Portal vein and superior mesenteric vein are patent. There is no gastrohepatic or hepatoduodenal ligament lymphadenopathy. No intraperitoneal or retroperitoneal lymphadenopathy. No pelvic sidewall lymphadenopathy. Reproductive: The uterus is surgically absent. There is no adnexal mass. Other: Small volume intraperitoneal free fluid noted in the right para colic gutter. Musculoskeletal: Bone windows reveal no worrisome lytic or sclerotic osseous lesions. IMPRESSION: 1. Markedly thickened, inflamed appendix with periappendiceal edema/ inflammation. Imaging features consistent with acute appendicitis. 2. High attenuation in the fundus of the gallbladder may be related to stones trapped behind a gallbladder fold. This etiology is favored for the appearance given that there does not appear to be any washout in this region on renal delay imaging, but soft tissue thickening related to adenomyomatosis or neoplasm cannot be entirely excluded. MRI without and with contrast may prove helpful to further evaluate. Imaging findings of potential clinical significance: Abdominal Aortic Atherosclerois (ICD10-170.0) 1. Electronically Signed: By: Misty Stanley M.D. On: 10/30/2015 16:35    EKG:  No orders found for this or any previous visit.  IMPRESSION AND PLAN:   Alexis [name]  is a 65 y.o. female with a known history of Hypertension, insulin-dependent diabetes mellitus, sleep apnea presented to the hospital secondary to acute appendicitis. Medical consult requested for medical management of her diabetes and hypertension.  #1 insulin-dependent diabetes mellitus- check A1c. Sugars have been elevated as patient was on D5 half normal saline after surgery. -Change fluids. Continue sliding scale insulin for now. Might have to add low-dose Lantus for tonight  depending upon her sugars. -She remains nothing by mouth at this time. -Also metformin is on hold  #2 hypertension-enalapril and propranolol held due to low normal blood pressures. -Could be from blood loss during and after surgery. Continue fluid resuscitation. And transfuse if hemoglobin dropping rapidly  #3 acute on chronic postop anemia-hemoglobin dropped from 11.7-9.6. -Increased bleeding from abdominal drain noted as well. Management per surgery. If continues to bleed, they will have to take her back to surgery. -Transfuse appropriately  #4 acute appendicitis-status post laparoscopic appendectomy. Course complicated by postoperative bleeding. Increased intra-abdominal adhesions from prior surgeries. -Empiric antibiotics with Zosyn continued as white count is elevated  #5 DVT prophylaxis-Ted's and SCDs. No heparin products due to bleeding.   Thank you for allowing me to participate in the care of Alexis [name]    All the records are reviewed and case discussed with Consulting provider. Management plans discussed with the patient, family and they are in agreement.  CODE STATUS: Full code  TOTAL TIME TAKING CARE OF THIS PATIENT: 50 minutes.    Gladstone Lighter M.D on 10/31/2015 at 1:43 PM  Between 7am to 6pm - Pager - [phone]  After 6pm go to [email or web address] - [facility]  Office  [phone]  CC: Primary care Physician: Elsie Stain, MD

## 2015-10-31 NOTE — Op Note (Signed)
Procedure Note  Indications: The patient presented with a history of right-sided abdominal pain. A CT scan revealed findings consistent with acute appendicitis.  Pre-operative Diagnosis: Acute appendicitis with localized peritonitis  Post-operative Diagnosis: Same  Procedure: 1. Laparoscopic Extensive Enterolysis for >120 minutes  2. Laparoscopic appendectomy  Surgeon: Hubbard Robinson   Assistants: none  Anesthesia: General endotracheal anesthesia  ASA Class: 3  Procedure Details  The patient was seen again in the Holding Room. The risks, benefits, complications, treatment options, and expected outcomes were discussed with the patient and/or family. The possibilities of reaction to medication, pulmonary aspiration, perforation of viscus, bleeding, recurrent infection, finding a normal appendix, the need for additional procedures, failure to diagnose a condition, and creating a complication requiring transfusion or operation were discussed. There was concurrence with the proposed plan and informed consent was obtained. The site of surgery was properly noted/marked. The patient was taken to Operating Room, identified as Alexis [name] and the procedure verified as Appendectomy. A Time Out was held and the above information confirmed.  The patient was placed in the supine position and general anesthesia was induced and patient intubated without difficulty.  The abdomen was prepped and draped in a sterile fashion. A one centimeter infraumbilical incision was made and the peritoneal cavity was accessed using the Hasson technique.  The subcutaneous tissues were divided with Bovie down to the anterior fascia. Clavicular clamps were then used to grasp the anterior fascia and a 15 blade scalpel was used to incise through. Blunt dissection was then used with hemostat and then with finger tender into the abdominal cavity. Two 0-Vicryl  stitches were placed in figure-of-eight on both sides of the  anterior fascia and a Hassan trocar was then placed inside the abdomen and secured with a stitch. The abdomen was then insufflated to 15 mmHg without any difficulty. The camera was then inserted and the abdomen inspected to reveal completely socked in area with bowel adhesed to the abdominal wall along the midline and in the right lower quadrant. There is small window in the left lower quadrant where the peritoneum could be seen and was cleared. A 5 mm trocar was then placed after checking with local anesthetic all under direct vision. The patient was placed in Trendelenburg and left lateral decubitus position.   A bowel grasper was then used to gently start using blunt dissection to perform enteroclysis of the bowel from the abdominal wall. This dissection was able to be performed until an area in the right upper quadrant was found to be free of adhesions. At this time two separate 5 mm trocar cannulas were then placed one in the right upper quadrant and one in the mid epigastrium both under direct visualization after checking with local anesthetic.    Using this viewpoint the bowel that was adhesed to the abdominal wall was again bluntly and sharply dissected being careful to stay away from the bowel wall and not to create serosal tears. There was both transverse colon adhered to the abdominal wall as well as small bowel. Again the bowel was adhered in this manner from above the umbilicus down into the pelvis and in the right lower quadrant. Systematic careful enteroclysis was performed laparoscopically in order to get the bowel off of the area in question. Part of the peritoneum was taken down in the right lower quadrant and a 2 cm area where the bowel was densely adhesed. In all minutes with greater than 120 minutes to complete the enteroclysis.  At this point the bowel was inspected and the cecum identified as well as an enlarged and inflamed appendix that was  adherent to the cecum.There was no evidence of  perforation. The appendix was carefully dissected off the cecum and the base was found. A window was made in the mesoappendix at the base of the appendix.The appendix was divided at its base using an endo-GIA stapler. Dissection of the appendix off the cecum the mesentery was torn and in the appendiceal artery was identified with some hemorrhage. Laparoscopic clips were then placed along this artery and hemostasis was achieved.  Minimal appendiceal stump was left in place. There was no evidence of bleeding, leakage, or complication after division of the appendix. Irrigation was also performed and irrigate suctioned from the abdomen as well.  The appendix was then placed into an Endo Catch bag which was taken out the [facility] trocar port.  The abdomen was then inspected again and the entire bowel was run. There were no areas of serosal tear is identified only some peritoneum that was taken down that was adherent to the transverse colon into some of the small bowel. At this time a 55 [place] JP drain was then placed through the [facility] trocar and pulled out of the left lower quadrant port site under direct visualization, with the tip of the drain placed in the right lower quadrant and then down into the pelvis.    The umbilical port site was closed using the previously placed figure-of-eight sutures at the fascia.  The trocar site skin wounds were closed using 4-0 Monocryl. Sterile glue was applied as to these sites. A 3-0 nylon was used as a drain stitch to the left lower quadrant site.   Instrument, sponge, and needle counts were correct at the conclusion of the case.   Findings:  Extensive adhesion throughout the abdomen, requiring the use of a 4th port and taking a significant portion of the case at >120 minutes.  The appendix was found to be inflamed. There were not signs of necrosis.  There was not perforation. There was not abscess formation.  Estimated Blood Loss:  200 mL         Drains: 56F JP drain  in the pelvis         Total IV Fluids: 2000 mL         Specimens: appendix         Complications:  None; patient tolerated the procedure well.         Disposition: PACU - hemodynamically stable.         Condition: stable

## 2015-10-31 NOTE — Progress Notes (Addendum)
First unit of RBCs started at this time. Pre transfusion vital signs are stable, will stay with pt for the first 15 minutes and recheck VS. Pt is resting comfortably at this moment.   Angus Seller

## 2015-11-01 DIAGNOSIS — K353 Acute appendicitis with localized peritonitis: Secondary | ICD-10-CM | POA: Diagnosis present

## 2015-11-01 DIAGNOSIS — E1165 Type 2 diabetes mellitus with hyperglycemia: Secondary | ICD-10-CM | POA: Diagnosis present

## 2015-11-01 DIAGNOSIS — I10 Essential (primary) hypertension: Secondary | ICD-10-CM | POA: Diagnosis present

## 2015-11-01 DIAGNOSIS — Z8249 Family history of ischemic heart disease and other diseases of the circulatory system: Secondary | ICD-10-CM | POA: Diagnosis not present

## 2015-11-01 DIAGNOSIS — K219 Gastro-esophageal reflux disease without esophagitis: Secondary | ICD-10-CM | POA: Diagnosis present

## 2015-11-01 DIAGNOSIS — K567 Ileus, unspecified: Secondary | ICD-10-CM | POA: Diagnosis not present

## 2015-11-01 DIAGNOSIS — D62 Acute posthemorrhagic anemia: Secondary | ICD-10-CM | POA: Diagnosis not present

## 2015-11-01 DIAGNOSIS — K3580 Unspecified acute appendicitis: Secondary | ICD-10-CM | POA: Diagnosis present

## 2015-11-01 DIAGNOSIS — G4730 Sleep apnea, unspecified: Secondary | ICD-10-CM | POA: Diagnosis present

## 2015-11-01 DIAGNOSIS — Z801 Family history of malignant neoplasm of trachea, bronchus and lung: Secondary | ICD-10-CM | POA: Diagnosis not present

## 2015-11-01 DIAGNOSIS — Z79899 Other long term (current) drug therapy: Secondary | ICD-10-CM | POA: Diagnosis not present

## 2015-11-01 DIAGNOSIS — Z7982 Long term (current) use of aspirin: Secondary | ICD-10-CM | POA: Diagnosis not present

## 2015-11-01 DIAGNOSIS — E785 Hyperlipidemia, unspecified: Secondary | ICD-10-CM | POA: Diagnosis present

## 2015-11-01 DIAGNOSIS — Z8049 Family history of malignant neoplasm of other genital organs: Secondary | ICD-10-CM | POA: Diagnosis not present

## 2015-11-01 DIAGNOSIS — Z800 Family history of malignant neoplasm of digestive organs: Secondary | ICD-10-CM | POA: Diagnosis not present

## 2015-11-01 DIAGNOSIS — D649 Anemia, unspecified: Secondary | ICD-10-CM | POA: Diagnosis present

## 2015-11-01 DIAGNOSIS — Z794 Long term (current) use of insulin: Secondary | ICD-10-CM | POA: Diagnosis not present

## 2015-11-01 DIAGNOSIS — K660 Peritoneal adhesions (postprocedural) (postinfection): Secondary | ICD-10-CM | POA: Diagnosis present

## 2015-11-01 LAB — TYPE AND SCREEN
ABO/RH(D): A POS
ANTIBODY SCREEN: NEGATIVE
Unit division: 0
Unit division: 0

## 2015-11-01 LAB — BASIC METABOLIC PANEL
Anion gap: 6 (ref 5–15)
BUN: 18 mg/dL (ref 6–20)
CALCIUM: 7.4 mg/dL — AB (ref 8.9–10.3)
CHLORIDE: 104 mmol/L (ref 101–111)
CO2: 27 mmol/L (ref 22–32)
CREATININE: 0.83 mg/dL (ref 0.44–1.00)
GFR calc non Af Amer: >60 mL/min (ref >60)
GLUCOSE: 138 mg/dL — AB (ref 65–99)
Potassium: 3.8 mmol/L (ref 3.5–5.1)
Sodium: 137 mmol/L (ref 135–145)

## 2015-11-01 LAB — CBC
HEMATOCRIT: 27.4 % — AB (ref 35.0–47.0)
HEMOGLOBIN: 9.6 g/dL — AB (ref 12.0–16.0)
MCH: 29.9 pg (ref 26.0–34.0)
MCHC: 34.9 g/dL (ref 32.0–36.0)
MCV: 85.7 fL (ref 80.0–100.0)
Platelets: 199 10*3/uL (ref 150–440)
RBC: 3.2 MIL/uL — ABNORMAL LOW (ref 3.80–5.20)
RDW: 13.6 % (ref 11.5–14.5)
WBC: 14.9 10*3/uL — ABNORMAL HIGH (ref 3.6–11.0)

## 2015-11-01 LAB — GLUCOSE, CAPILLARY
GLUCOSE-CAPILLARY: 130 mg/dL — AB (ref 65–99)
GLUCOSE-CAPILLARY: 134 mg/dL — AB (ref 65–99)
GLUCOSE-CAPILLARY: 125 mg/dL — AB (ref 65–99)
Glucose-Capillary: 178 mg/dL — ABNORMAL HIGH (ref 65–99)
Glucose-Capillary: 199 mg/dL — ABNORMAL HIGH (ref 65–99)

## 2015-11-01 LAB — HEMOGLOBIN AND HEMATOCRIT, BLOOD
HCT: 26.9 % — ABNORMAL LOW (ref 35.0–47.0)
Hemoglobin: 9.2 g/dL — ABNORMAL LOW (ref 12.0–16.0)

## 2015-11-01 MED ORDER — PANTOPRAZOLE SODIUM 40 MG PO TBEC
40.0000 mg | DELAYED_RELEASE_TABLET | Freq: Every day | ORAL | Status: DC
  Administered 2015-11-01 – 2015-11-03 (×3): 40 mg via ORAL
  Filled 2015-11-01 (×3): qty 1

## 2015-11-01 NOTE — Progress Notes (Signed)
Second blood transfusion has completed. VSS. Verbal order from Dr. Azalee Course to receive H&H with 0500 labs 11/01/2015. Pt resting comfortably at this time.   Angus Seller

## 2015-11-01 NOTE — Progress Notes (Signed)
[place] [facility] [place] NAME: Alexis [name]    MR#:  [id number]  DATE OF BIRTH:  06-27-1950  SUBJECTIVE:  CHIEF COMPLAINT:   Chief Complaint  Patient presents with  . Abdominal Pain   - admitted for acute appendicitis, had appendicectomy done. Postoperative course complicated with increased bleeding. -Received 2 units of packed RBC transfusion yesterday. Hemoglobin is better. -Continue to monitor blood pressure and sugars  REVIEW OF SYSTEMS:  Review of Systems  Constitutional: Positive for malaise/fatigue. Negative for fever and chills.  HENT: Negative for ear discharge, ear pain and tinnitus.   Eyes: Negative for blurred vision and double vision.  Respiratory: Negative for cough, shortness of breath and wheezing.   Cardiovascular: Negative for chest pain, palpitations and leg swelling.  Gastrointestinal: Positive for abdominal pain. Negative for nausea, vomiting, diarrhea and constipation.  Genitourinary: Negative for dysuria and urgency.  Musculoskeletal: Negative for myalgias.  Neurological: Positive for dizziness. Negative for sensory change, speech change, focal weakness, seizures and headaches.  Psychiatric/Behavioral: Negative for depression.    DRUG ALLERGIES:  No Known Allergies  VITALS:  Blood pressure 109/58, pulse 78, temperature 97.7 F (36.5 C), temperature source Oral, resp. rate 17, height 5\' 3"  (1.6 m), weight 94.348 kg (208 lb), SpO2 99 %.  PHYSICAL EXAMINATION:  Physical Exam  GENERAL: 65 y.o.-year-old patient lying in the bed with no acute distress.  EYES: Pupils equal, round, reactive to light and accommodation. No scleral icterus. Extraocular muscles intact.  HEENT: Head atraumatic, normocephalic. Oropharynx and nasopharynx clear.  NECK: Supple, no jugular venous distention. No thyroid enlargement, no tenderness.  LUNGS: Normal breath sounds bilaterally, no wheezing, rales,rhonchi or crepitation. No  use of accessory muscles of respiration.  CARDIOVASCULAR: S1, S2 normal. No murmurs, rubs, or gallops.  ABDOMEN: Soft, tenderness at the surgical sites noted, no guarding or rigidity, nondistended. Bowel sounds present. No organomegaly or mass.  EXTREMITIES: No pedal edema, cyanosis, or clubbing.  NEUROLOGIC: Cranial nerves II through XII are intact. Muscle strength 5/5 in all extremities. Sensation intact. Gait not checked.  PSYCHIATRIC: The patient is alert and oriented x 3.  SKIN: No obvious rash, lesion, or ulcer.    LABORATORY PANEL:   CBC  Recent Labs Lab 11/01/15 0640  WBC 14.9*  HGB 9.6*  HCT 27.4*  PLT 199   ------------------------------------------------------------------------------------------------------------------  Chemistries   Recent Labs Lab 10/30/15 1107  11/01/15 0640  NA 136  < > 137  K 4.7  < > 3.8  CL 100*  < > 104  CO2 27  < > 27  GLUCOSE 237*  < > 138*  BUN 12  < > 18  CREATININE 0.66  < > 0.83  CALCIUM 9.6  < > 7.4*  AST 18  --   --   ALT 13*  --   --   ALKPHOS 95  --   --   BILITOT 0.8  --   --   < > = values in this interval not displayed. ------------------------------------------------------------------------------------------------------------------  Cardiac Enzymes No results for input(s): TROPONINI in the last 168 hours. ------------------------------------------------------------------------------------------------------------------  RADIOLOGY:  Ct Abdomen Pelvis W Contrast  10/30/2015  ADDENDUM REPORT: 10/30/2015 16:47 ADDENDUM: Critical Value/emergent results were called by telephone at the time of interpretation on 10/30/2015 at 4:47 pm to Dr. Reita Cliche , who verbally acknowledged these results. Electronically Signed   By: Misty Stanley M.D.   On: 10/30/2015 16:47  10/30/2015  CLINICAL DATA:  Generalized abdominal pain but  worse in the right lower quadrant since yesterday. EXAM: CT ABDOMEN AND PELVIS WITH CONTRAST TECHNIQUE:  Multidetector CT imaging of the abdomen and pelvis was performed using the standard protocol following bolus administration of intravenous contrast. CONTRAST:  189mL ISOVUE-300 IOPAMIDOL (ISOVUE-300) INJECTION 61% COMPARISON:  None. FINDINGS: Lower chest:  Unremarkable. Hepatobiliary: No focal abnormality within the liver parenchyma. There appears to be a Phrygian cap of the gallbladder fundus with dependent high density material. This could be wall thickening related to adenomyomatosis or stones layering in the fundus. Enhancing soft tissue mass is considered less likely. No intrahepatic or extrahepatic biliary dilation. Pancreas: No focal mass lesion. No dilatation of the main duct. No intraparenchymal cyst. No peripancreatic edema. Spleen: No splenomegaly. No focal mass lesion. Adrenals/Urinary Tract: No adrenal nodule or mass. The kidneys are normal in appearance. No evidence for hydroureter. The urinary bladder appears normal for the degree of distention. Stomach/Bowel: Stomach is nondistended. No gastric wall thickening. No evidence of outlet obstruction. Duodenum diverticulum noted. Duodenum is normally positioned as is the ligament of Treitz. No small bowel wall thickening. No small bowel dilatation. The terminal ileum is normal. The appendix is dilated up to 17 mm diameter with substantial periappendiceal edema/inflammation. High attenuation material at the base of the appendix may represent appendicolith. There is some wall thickening in the cecum at the base of the appendix. No gross colonic mass. No colonic wall thickening. No substantial diverticular change. Vascular/Lymphatic: There is abdominal aortic atherosclerosis without aneurysm. Portal vein and superior mesenteric vein are patent. There is no gastrohepatic or hepatoduodenal ligament lymphadenopathy. No intraperitoneal or retroperitoneal lymphadenopathy. No pelvic sidewall lymphadenopathy. Reproductive: The uterus is surgically absent. There is  no adnexal mass. Other: Small volume intraperitoneal free fluid noted in the right para colic gutter. Musculoskeletal: Bone windows reveal no worrisome lytic or sclerotic osseous lesions. IMPRESSION: 1. Markedly thickened, inflamed appendix with periappendiceal edema/ inflammation. Imaging features consistent with acute appendicitis. 2. High attenuation in the fundus of the gallbladder may be related to stones trapped behind a gallbladder fold. This etiology is favored for the appearance given that there does not appear to be any washout in this region on renal delay imaging, but soft tissue thickening related to adenomyomatosis or neoplasm cannot be entirely excluded. MRI without and with contrast may prove helpful to further evaluate. Imaging findings of potential clinical significance: Abdominal Aortic Atherosclerois (ICD10-170.0) 1. Electronically Signed: By: Misty Stanley M.D. On: 10/30/2015 16:35    EKG:  No orders found for this or any previous visit.  ASSESSMENT AND PLAN:   Jenely Bagby is a 64 y.o. female with a known history of Hypertension, insulin-dependent diabetes mellitus, sleep apnea presented to the hospital secondary to acute appendicitis. Medical consult requested for medical management of her diabetes and hypertension.  #1 Insulin-dependent diabetes mellitus-  Sugars are well controlled today - start lantus low dose as will be started on a diet - Continue sliding scale insulin for now.  -Also metformin is on hold  #2 hypertension-enalapril and propranolol held due to low normal blood pressures. -Could be from blood loss during and after surgery.  -Continue fluid resuscitation.   #3 acute on chronic, postop anemia-hemoglobin dropped, received 2 units Tx last night and hb better now. - drain bleeding is slowing too -Management per surgery. If continues to bleed, they will have to take her back to surgery. -Transfuse appropriately  #4 acute appendicitis-status post  laparoscopic appendectomy. Course complicated by postoperative bleeding.  -Increased intra-abdominal adhesions from prior  surgeries. -Empiric antibiotics with Zosyn continued, white count is improving - will be started on liquids today  #5 DVT prophylaxis-Ted's and SCDs. No heparin products due to bleeding.   Encourage ambulation today.   All the records are reviewed and case discussed with Care Management/Social Workerr. Management plans discussed with the patient, family and they are in agreement.  CODE STATUS: Full code  TOTAL TIME TAKING CARE OF THIS PATIENT: 37 minutes.   POSSIBLE D/C IN 1 DAYS, DEPENDING ON CLINICAL CONDITION.   Gladstone Lighter M.D on 11/01/2015 at 12:22 PM  Between 7am to 6pm - Pager - [phone]  After 6pm go to [email or web address] - password [email or web address] [facility]  Office  [phone]  CC: Primary care physician; Elsie Stain, MD

## 2015-11-01 NOTE — Progress Notes (Signed)
Pt JP drain had a total of 185 cc throughout the last 12 hours. Dressing around [place] drain was changed due moderate amount of old drainage around site. Will continue to monitor pt and JP drain site.    Angus Seller

## 2015-11-01 NOTE — Progress Notes (Signed)
Subjective:   She feels better today with less lightheadedness and no nausea. Her pain is improved. She is able to void spontaneously. She received 2 units of blood last evening. Her hemoglobin is now back up to 9.6. The drainage in her JP drain has dropped off. She is breathing comfortably  Vital signs in last 24 hours: Temp:  [97.7 F (36.5 C)-98.7 F (37.1 C)] 97.7 F (36.5 C) (07/16 0549) Pulse Rate:  [78-96] 78 (07/16 0549) Resp:  [17-19] 17 (07/16 0549) BP: (108-131)/(56-68) 109/58 mmHg (07/16 0549) SpO2:  [99 %-100 %] 99 % (07/16 0549) Last BM Date: 10/30/15  Intake/Output from previous day: 07/15 0701 - 07/16 0700 In: 2860 [I.V.:1763; Blood:547; IV Piggyback:550] Out: 50 [Urine:500; Drains:490]  Exam:  Her abdomen is soft with minimal drainage around the JP drain. She has active bowel sounds with incisional tenderness but no significant distention rebound or guarding. She is breathing comfortably with no exertion.  Lab Results:  CBC  Recent Labs  10/31/15 0341  10/31/15 1754 11/01/15 0640  WBC 21.8*  --   --  14.9*  HGB 11.7*  < > 7.9* 9.6*  HCT 34.1*  < > 23.3* 27.4*  PLT 304  --   --  199  < > = values in this interval not displayed. CMP     Component Value Date/Time   NA 137 11/01/2015 0640   NA 141 07/29/2010   K 3.8 11/01/2015 0640   CL 104 11/01/2015 0640   CO2 27 11/01/2015 0640   GLUCOSE 138* 11/01/2015 0640   BUN 18 11/01/2015 0640   BUN 10 07/29/2010   CREATININE 0.83 11/01/2015 0640   CREATININE 0.6 07/29/2010   CALCIUM 7.4* 11/01/2015 0640   PROT 7.7 10/30/2015 1107   ALBUMIN 3.9 10/30/2015 1107   AST 18 10/30/2015 1107   ALT 13* 10/30/2015 1107   ALKPHOS 95 10/30/2015 1107   BILITOT 0.8 10/30/2015 1107   GFRNONAA >60 11/01/2015 0640   GFRAA >60 11/01/2015 0640   PT/INR No results for input(s): LABPROT, INR in the last 72 hours.  Studies/Results: Ct Abdomen Pelvis W Contrast  10/30/2015  ADDENDUM REPORT: 10/30/2015 16:47 ADDENDUM:  Critical Value/emergent results were called by telephone at the time of interpretation on 10/30/2015 at 4:47 pm to Dr. Reita Cliche , who verbally acknowledged these results. Electronically Signed   By: Misty Stanley M.D.   On: 10/30/2015 16:47  10/30/2015  CLINICAL DATA:  Generalized abdominal pain but worse in the right lower quadrant since yesterday. EXAM: CT ABDOMEN AND PELVIS WITH CONTRAST TECHNIQUE: Multidetector CT imaging of the abdomen and pelvis was performed using the standard protocol following bolus administration of intravenous contrast. CONTRAST:  141mL ISOVUE-300 IOPAMIDOL (ISOVUE-300) INJECTION 61% COMPARISON:  None. FINDINGS: Lower chest:  Unremarkable. Hepatobiliary: No focal abnormality within the liver parenchyma. There appears to be a Phrygian cap of the gallbladder fundus with dependent high density material. This could be wall thickening related to adenomyomatosis or stones layering in the fundus. Enhancing soft tissue mass is considered less likely. No intrahepatic or extrahepatic biliary dilation. Pancreas: No focal mass lesion. No dilatation of the main duct. No intraparenchymal cyst. No peripancreatic edema. Spleen: No splenomegaly. No focal mass lesion. Adrenals/Urinary Tract: No adrenal nodule or mass. The kidneys are normal in appearance. No evidence for hydroureter. The urinary bladder appears normal for the degree of distention. Stomach/Bowel: Stomach is nondistended. No gastric wall thickening. No evidence of outlet obstruction. Duodenum diverticulum noted. Duodenum is normally positioned as is  the ligament of Treitz. No small bowel wall thickening. No small bowel dilatation. The terminal ileum is normal. The appendix is dilated up to 17 mm diameter with substantial periappendiceal edema/inflammation. High attenuation material at the base of the appendix may represent appendicolith. There is some wall thickening in the cecum at the base of the appendix. No gross colonic mass. No colonic  wall thickening. No substantial diverticular change. Vascular/Lymphatic: There is abdominal aortic atherosclerosis without aneurysm. Portal vein and superior mesenteric vein are patent. There is no gastrohepatic or hepatoduodenal ligament lymphadenopathy. No intraperitoneal or retroperitoneal lymphadenopathy. No pelvic sidewall lymphadenopathy. Reproductive: The uterus is surgically absent. There is no adnexal mass. Other: Small volume intraperitoneal free fluid noted in the right para colic gutter. Musculoskeletal: Bone windows reveal no worrisome lytic or sclerotic osseous lesions. IMPRESSION: 1. Markedly thickened, inflamed appendix with periappendiceal edema/ inflammation. Imaging features consistent with acute appendicitis. 2. High attenuation in the fundus of the gallbladder may be related to stones trapped behind a gallbladder fold. This etiology is favored for the appearance given that there does not appear to be any washout in this region on renal delay imaging, but soft tissue thickening related to adenomyomatosis or neoplasm cannot be entirely excluded. MRI without and with contrast may prove helpful to further evaluate. Imaging findings of potential clinical significance: Abdominal Aortic Atherosclerois (ICD10-170.0) 1. Electronically Signed: By: Misty Stanley M.D. On: 10/30/2015 16:35    Assessment/Plan: She is improving and appears to not be bleeding is significantly this morning. I will repeat her hemoglobin later this afternoon. In the interim we will start her on a liquid diet. We will increase her activity level. She is in agreement.

## 2015-11-02 ENCOUNTER — Encounter: Payer: Self-pay | Admitting: Surgery

## 2015-11-02 LAB — GLUCOSE, CAPILLARY
GLUCOSE-CAPILLARY: 133 mg/dL — AB (ref 65–99)
GLUCOSE-CAPILLARY: 126 mg/dL — AB (ref 65–99)
GLUCOSE-CAPILLARY: 176 mg/dL — AB (ref 65–99)
Glucose-Capillary: 128 mg/dL — ABNORMAL HIGH (ref 65–99)
Glucose-Capillary: 142 mg/dL — ABNORMAL HIGH (ref 65–99)
Glucose-Capillary: 169 mg/dL — ABNORMAL HIGH (ref 65–99)

## 2015-11-02 LAB — CBC
HEMATOCRIT: 25.1 % — AB (ref 35.0–47.0)
HEMOGLOBIN: 8.8 g/dL — AB (ref 12.0–16.0)
MCH: 30.1 pg (ref 26.0–34.0)
MCHC: 34.9 g/dL (ref 32.0–36.0)
MCV: 86.1 fL (ref 80.0–100.0)
Platelets: 224 10*3/uL (ref 150–440)
RBC: 2.92 MIL/uL — ABNORMAL LOW (ref 3.80–5.20)
RDW: 14.1 % (ref 11.5–14.5)
WBC: 9.9 10*3/uL (ref 3.6–11.0)

## 2015-11-02 LAB — BASIC METABOLIC PANEL
ANION GAP: 3 — AB (ref 5–15)
BUN: 13 mg/dL (ref 6–20)
CALCIUM: 7.5 mg/dL — AB (ref 8.9–10.3)
CHLORIDE: 109 mmol/L (ref 101–111)
CO2: 28 mmol/L (ref 22–32)
Creatinine, Ser: 0.74 mg/dL (ref 0.44–1.00)
GFR calc Af Amer: >60 mL/min (ref >60)
GFR calc non Af Amer: >60 mL/min (ref >60)
GLUCOSE: 130 mg/dL — AB (ref 65–99)
Potassium: 3.5 mmol/L (ref 3.5–5.1)
Sodium: 140 mmol/L (ref 135–145)

## 2015-11-02 LAB — HEMOGLOBIN A1C: Hgb A1c MFr Bld: 8.5 % — ABNORMAL HIGH (ref 4.0–6.0)

## 2015-11-02 MED ORDER — INSULIN ASPART 100 UNIT/ML ~~LOC~~ SOLN
0.0000 [IU] | Freq: Every day | SUBCUTANEOUS | Status: DC
  Administered 2015-11-03: 2 [IU] via SUBCUTANEOUS
  Filled 2015-11-02: qty 2

## 2015-11-02 MED ORDER — INSULIN ASPART 100 UNIT/ML ~~LOC~~ SOLN
0.0000 [IU] | Freq: Three times a day (TID) | SUBCUTANEOUS | Status: DC
  Administered 2015-11-02 – 2015-11-04 (×5): 2 [IU] via SUBCUTANEOUS
  Filled 2015-11-02 (×5): qty 2

## 2015-11-02 MED ORDER — MUPIROCIN 2 % EX OINT
1.0000 "application " | TOPICAL_OINTMENT | Freq: Two times a day (BID) | CUTANEOUS | Status: DC
  Administered 2015-11-02 – 2015-11-03 (×4): 1 via NASAL
  Filled 2015-11-02: qty 22

## 2015-11-02 MED ORDER — PROPRANOLOL HCL 10 MG PO TABS
10.0000 mg | ORAL_TABLET | Freq: Two times a day (BID) | ORAL | Status: DC
  Administered 2015-11-02 – 2015-11-03 (×4): 10 mg via ORAL
  Filled 2015-11-02 (×4): qty 1

## 2015-11-02 MED ORDER — CHLORHEXIDINE GLUCONATE CLOTH 2 % EX PADS
6.0000 | MEDICATED_PAD | Freq: Every day | CUTANEOUS | Status: DC
  Administered 2015-11-03 – 2015-11-04 (×2): 6 via TOPICAL

## 2015-11-02 NOTE — Progress Notes (Signed)
POD # 2 lap appy w extensive LOA Some minor post operative bleed requiring transfusion She feels better this am Tolerated PO AVSS Hb 8.8 JP 363 ( decreased from the day before)  PE NAD Abd: soft, NT, incision c/d/i, no peritonitis. JP sanguineous drainage. No active bleed  A/P We will hold off NSAIDS and lovenox since she continuous to have slow ooze reflected in persisted output No need for emergent intervention at this time Mobilize Repeat hb in am

## 2015-11-02 NOTE — Progress Notes (Signed)
[place] [facility] [place] NAME: Alexis [name] Orders    MR#:  [id number]  DATE OF BIRTH:  07/06/1950  SUBJECTIVE:  CHIEF COMPLAINT:   Chief Complaint  Patient presents with  . Abdominal Pain   - admitted for acute appendicitis, had appendicectomy done. Postoperative course complicated with increased bleeding. -Received 2 units of packed RBC transfusion this admission.  - Slight drop in hemoglobin. Still has some bleeding in her drain. Overall feels much better and improving  REVIEW OF SYSTEMS:  Review of Systems  Constitutional: Negative for fever, chills and malaise/fatigue.  HENT: Negative for ear discharge, ear pain and tinnitus.   Eyes: Negative for blurred vision and double vision.  Respiratory: Negative for cough, shortness of breath and wheezing.   Cardiovascular: Negative for chest pain, palpitations and leg swelling.  Gastrointestinal: Positive for abdominal pain. Negative for nausea, vomiting, diarrhea and constipation.  Genitourinary: Negative for dysuria and urgency.  Musculoskeletal: Negative for myalgias.  Neurological: Negative for dizziness, sensory change, speech change, focal weakness, seizures and headaches.  Psychiatric/Behavioral: Negative for depression.    DRUG ALLERGIES:  No Known Allergies  VITALS:  Blood pressure 152/54, pulse 100, temperature 98.3 F (36.8 C), temperature source Oral, resp. rate 20, height 5\' 3"  (1.6 m), weight 94.348 kg (208 lb), SpO2 98 %.  PHYSICAL EXAMINATION:  Physical Exam  GENERAL: 65 y.o.-year-old patient lying in the bed with no acute distress.  EYES: Pupils equal, round, reactive to light and accommodation. No scleral icterus. Extraocular muscles intact.  HEENT: Head atraumatic, normocephalic. Oropharynx and nasopharynx clear.  NECK: Supple, no jugular venous distention. No thyroid enlargement, no tenderness.  LUNGS: Normal breath sounds bilaterally, no wheezing, rales,rhonchi  or crepitation. No use of accessory muscles of respiration.  CARDIOVASCULAR: S1, S2 normal. No murmurs, rubs, or gallops.  ABDOMEN: Soft, minimal tenderness at the surgical sites noted, no guarding or rigidity, nondistended. Bowel sounds present. No organomegaly or mass.  EXTREMITIES: No pedal edema, cyanosis, or clubbing.  NEUROLOGIC: Cranial nerves II through XII are intact. Muscle strength 5/5 in all extremities. Sensation intact. Gait not checked.  PSYCHIATRIC: The patient is alert and oriented x 3.  SKIN: No obvious rash, lesion, or ulcer.    LABORATORY PANEL:   CBC  Recent Labs Lab 11/02/15 0640  WBC 9.9  HGB 8.8*  HCT 25.1*  PLT 224   ------------------------------------------------------------------------------------------------------------------  Chemistries   Recent Labs Lab 10/30/15 1107  11/02/15 0640  NA 136  < > 140  K 4.7  < > 3.5  CL 100*  < > 109  CO2 27  < > 28  GLUCOSE 237*  < > 130*  BUN 12  < > 13  CREATININE 0.66  < > 0.74  CALCIUM 9.6  < > 7.5*  AST 18  --   --   ALT 13*  --   --   ALKPHOS 95  --   --   BILITOT 0.8  --   --   < > = values in this interval not displayed. ------------------------------------------------------------------------------------------------------------------  Cardiac Enzymes No results for input(s): TROPONINI in the last 168 hours. ------------------------------------------------------------------------------------------------------------------  RADIOLOGY:  No results found.  EKG:  No orders found for this or any previous visit.  ASSESSMENT AND PLAN:   In [place] is a 65 y.o. female with a known history of Hypertension, insulin-dependent diabetes mellitus, sleep apnea presented to the hospital secondary to acute appendicitis. Medical consult requested for medical management of her diabetes  and hypertension.  #1 Insulin-dependent diabetes mellitus-  Sugars are well controlled - on lantus low dose as on  a diet - Continue sliding scale insulin for now.  -Also metformin is on hold  #2 hypertension-Propranolol restarted. If blood pressure remains high, enalapril can also be restarted. -Blood pressure is much improved. Discontinue IV fluids today  #3 acute on chronic, postop anemia-hemoglobin dropped, received 2 units Tx and hb better now. - drain bleeding is slowing too. Slow drop in hemoglobin noted. -Management per surgery. -Recheck hemoglobin tomorrow  #4 acute appendicitis-status post laparoscopic appendectomy. Course complicated by postoperative bleeding.  -Increased intra-abdominal adhesions from prior surgeries. -Empiric antibiotics with Zosyn continued, white count is improving - Much improving. Started on a regular diet now  #5 DVT prophylaxis-Ted's and SCDs. No heparin products due to bleeding. Lovenox discontinued   Encourage ambulation. Possible discharge tomorrow.    All the records are reviewed and case discussed with Care Management/Social Workerr. Management plans discussed with the patient, family and they are in agreement.  CODE STATUS: Full code  TOTAL TIME TAKING CARE OF THIS PATIENT: 37 minutes.   POSSIBLE D/C IN 1 DAYS, DEPENDING ON CLINICAL CONDITION.   Gladstone Lighter M.D on 11/02/2015 at 2:01 PM  Between 7am to 6pm - Pager - [phone]  After 6pm go to [email or web address] - password [email or web address] [facility]  Office  [phone]  CC: Primary care physician; Elsie Stain, MD

## 2015-11-02 NOTE — Progress Notes (Signed)
Pt ambulated in hallway x1 lap.  Pt became dizzy half-way through walk, however, pt was able to tolerate the entire walk.  Pt did state "it took a lot of me to walk."    Will continue to monitor and notify MD of any changes.

## 2015-11-03 LAB — GLUCOSE, CAPILLARY
GLUCOSE-CAPILLARY: 153 mg/dL — AB (ref 65–99)
GLUCOSE-CAPILLARY: 168 mg/dL — AB (ref 65–99)
GLUCOSE-CAPILLARY: 235 mg/dL — AB (ref 65–99)
Glucose-Capillary: 189 mg/dL — ABNORMAL HIGH (ref 65–99)

## 2015-11-03 LAB — CBC
HCT: 26.7 % — ABNORMAL LOW (ref 35.0–47.0)
Hemoglobin: 9.1 g/dL — ABNORMAL LOW (ref 12.0–16.0)
MCH: 29.9 pg (ref 26.0–34.0)
MCHC: 34 g/dL (ref 32.0–36.0)
MCV: 87.9 fL (ref 80.0–100.0)
PLATELETS: 255 10*3/uL (ref 150–440)
RBC: 3.04 MIL/uL — AB (ref 3.80–5.20)
RDW: 14 % (ref 11.5–14.5)
WBC: 9.5 10*3/uL (ref 3.6–11.0)

## 2015-11-03 LAB — SURGICAL PATHOLOGY

## 2015-11-03 MED ORDER — SENNA 8.6 MG PO TABS
1.0000 | ORAL_TABLET | Freq: Every day | ORAL | Status: DC
  Administered 2015-11-03: 8.6 mg via ORAL
  Filled 2015-11-03: qty 1

## 2015-11-03 MED ORDER — INSULIN GLARGINE 100 UNIT/ML ~~LOC~~ SOLN
15.0000 [IU] | Freq: Every day | SUBCUTANEOUS | Status: DC
  Filled 2015-11-03: qty 0.15

## 2015-11-03 MED ORDER — DOCUSATE SODIUM 100 MG PO CAPS
100.0000 mg | ORAL_CAPSULE | Freq: Two times a day (BID) | ORAL | Status: DC
  Administered 2015-11-03: 100 mg via ORAL
  Filled 2015-11-03: qty 1

## 2015-11-03 NOTE — Progress Notes (Signed)
[place] [facility] [place] NAME: Alexis [name]    MR#:  [id number]  DATE OF BIRTH:  [birth year]  SUBJECTIVE:  CHIEF COMPLAINT:   Chief Complaint  Patient presents with  . Abdominal Pain   - Feels better. Ambulating well. Hemoglobin is stable today. -Continues to have serosanguineous discharge from her drain. More pinkish tinged today.  REVIEW OF SYSTEMS:  Review of Systems  Constitutional: Negative for fever, chills and malaise/fatigue.  HENT: Negative for ear discharge, ear pain and tinnitus.   Eyes: Negative for blurred vision and double vision.  Respiratory: Negative for cough, shortness of breath and wheezing.   Cardiovascular: Negative for chest pain, palpitations and leg swelling.  Gastrointestinal: Positive for abdominal pain and constipation. Negative for nausea, vomiting and diarrhea.  Genitourinary: Negative for dysuria and urgency.  Musculoskeletal: Negative for myalgias.  Neurological: Negative for dizziness, sensory change, speech change, focal weakness, seizures and headaches.  Psychiatric/Behavioral: Negative for depression.    DRUG ALLERGIES:  No Known Allergies  VITALS:  Blood pressure 138/69, pulse 79, temperature 97.8 F (36.6 C), temperature source Oral, resp. rate 20, height 5\' 3"  (1.6 m), weight 94.348 kg (208 lb), SpO2 95 %.  PHYSICAL EXAMINATION:  Physical Exam  GENERAL: 65 y.o.-year-old patient lying in the bed with no acute distress.  EYES: Pupils equal, round, reactive to light and accommodation. No scleral icterus. Extraocular muscles intact.  HEENT: Head atraumatic, normocephalic. Oropharynx and nasopharynx clear.  NECK: Supple, no jugular venous distention. No thyroid enlargement, no tenderness.  LUNGS: Normal breath sounds bilaterally, no wheezing, rales,rhonchi or crepitation. No use of accessory muscles of respiration.  CARDIOVASCULAR: S1, S2 normal. No murmurs, rubs, or gallops.  ABDOMEN:  Soft, minimal tenderness at the surgical sites noted, no guarding or rigidity, nondistended. Bowel sounds present. No organomegaly or mass.  EXTREMITIES: No  cyanosis, or clubbing. 1+ pedal edema bilaterally NEUROLOGIC: Cranial nerves II through XII are intact. Muscle strength 5/5 in all extremities. Sensation intact. Gait not checked.  PSYCHIATRIC: The patient is alert and oriented x 3.  SKIN: No obvious rash, lesion, or ulcer.    LABORATORY PANEL:   CBC  Recent Labs Lab 11/03/15 0544  WBC 9.5  HGB 9.1*  HCT 26.7*  PLT 255   ------------------------------------------------------------------------------------------------------------------  Chemistries   Recent Labs Lab 10/30/15 1107  11/02/15 0640  NA 136  < > 140  K 4.7  < > 3.5  CL 100*  < > 109  CO2 27  < > 28  GLUCOSE 237*  < > 130*  BUN 12  < > 13  CREATININE 0.66  < > 0.74  CALCIUM 9.6  < > 7.5*  AST 18  --   --   ALT 13*  --   --   ALKPHOS 95  --   --   BILITOT 0.8  --   --   < > = values in this interval not displayed. ------------------------------------------------------------------------------------------------------------------  Cardiac Enzymes No results for input(s): TROPONINI in the last 168 hours. ------------------------------------------------------------------------------------------------------------------  RADIOLOGY:  No results found.  EKG:  No orders found for this or any previous visit.  ASSESSMENT AND PLAN:   Alexis [name] is a 65 y.o. female with a known history of Hypertension, insulin-dependent diabetes mellitus, sleep apnea presented to the hospital secondary to acute appendicitis. Medical consult requested for medical management of her diabetes and hypertension.  #1 Insulin-dependent diabetes mellitus-  Sugars are well controlled - on lantus low dose for  now- sugars better controlled - Continue sliding scale insulin for now.  -Also metformin is on hold  #2  hypertension-Propranolol restarted. If blood pressure remains high, enalapril can also be restarted. -Blood pressure is much improved. Discontinued IV fluids  #3 acute on chronic, postop anemia-hemoglobin dropped, received 2 units Tx and hb better now. - drain bleeding is slowing too. stable hemoglobin noted. -Management per surgery.  #4 acute appendicitis-status post laparoscopic appendectomy. Course complicated by postoperative bleeding.  -Increased intra-abdominal adhesions from prior surgeries. -Empiric antibiotics with Zosyn continued-10 minute discontinued at discharge, white count is improving - Started on a regular diet now -added Colace twice a day. Patient passing flatus, no bowel movements yet.  #5 DVT prophylaxis-Ted's and SCDs. No heparin products due to bleeding. Lovenox discontinued   Encourage ambulation. Possible discharge tomorrow.  Discussed with surgical team, medically stable. We'll sign off.   All the records are reviewed and case discussed with Care Management/Social Workerr. Management plans discussed with the patient, family and they are in agreement.  CODE STATUS: Full code  TOTAL TIME TAKING CARE OF THIS PATIENT: 33 minutes.   POSSIBLE D/C IN 1 DAYS, DEPENDING ON CLINICAL CONDITION.   Gladstone Lighter M.D on 11/03/2015 at 12:56 PM  Between 7am to 6pm - Pager - [phone]  After 6pm go to [email or web address] - password [email or web address] [facility]  Office  [phone]  CC: Primary care physician; Elsie Stain, MD

## 2015-11-03 NOTE — Progress Notes (Signed)
POD # 3 Doing well JP 200 cc decreasing Hb > 9  PE NAD Abd: soft, incisions c/d/i, JP still sanguinous fluid  A/P stable hb and decrease in output from JP, she still has some sanguinous fluid that needs to improve before DC. Overall improving, no need for immediate surgical intervention Continue to hold lovenox

## 2015-11-04 LAB — APTT: aPTT: 31 seconds (ref 24–36)

## 2015-11-04 LAB — CBC
HCT: 26.8 % — ABNORMAL LOW (ref 35.0–47.0)
HEMOGLOBIN: 9.4 g/dL — AB (ref 12.0–16.0)
MCH: 30.6 pg (ref 26.0–34.0)
MCHC: 35.2 g/dL (ref 32.0–36.0)
MCV: 87 fL (ref 80.0–100.0)
Platelets: 287 10*3/uL (ref 150–440)
RBC: 3.08 MIL/uL — AB (ref 3.80–5.20)
RDW: 14 % (ref 11.5–14.5)
WBC: 10.1 10*3/uL (ref 3.6–11.0)

## 2015-11-04 LAB — PROTIME-INR
INR: 1.13
PROTHROMBIN TIME: 14.7 s (ref 11.4–15.0)

## 2015-11-04 LAB — GLUCOSE, CAPILLARY: GLUCOSE-CAPILLARY: 174 mg/dL — AB (ref 65–99)

## 2015-11-04 MED ORDER — OXYCODONE HCL 5 MG PO TABS: 5.0000 mg | ORAL_TABLET | ORAL | Status: DC | PRN

## 2015-11-04 NOTE — Care Management Important Message (Signed)
Important Message  Patient Details  Name: Alexis [name] MRN: [id number] Date of Birth: 17-Mar-1951   Medicare Important Message Given:  Yes    Beverly Sessions, RN 11/04/2015, 10:37 AM

## 2015-11-04 NOTE — Progress Notes (Signed)
Pt d/c to home today.  JP drain left intact and pt educated on how to empty and record drainage.  IV removed intact.  Rx's given to pt w/all questions and concerns addressed.  D/C paperwork reviewed and education provided with all questions and concerns addressed.  Pt husband at bedside for home transport.

## 2015-11-04 NOTE — Anesthesia Postprocedure Evaluation (Signed)
Anesthesia Post Note  Patient: Alexis [name]  Procedure(s) Performed: Procedure(s) (LRB): APPENDECTOMY LAPAROSCOPIC, lysis of adhesions (N/A)  Patient location during evaluation: PACU Anesthesia Type: General Level of consciousness: awake Pain management: pain level controlled Vital Signs Assessment: post-procedure vital signs reviewed and stable Respiratory status: spontaneous breathing Cardiovascular status: stable Anesthetic complications: no    Last Vitals:  Filed Vitals:   11/03/15 2006 11/04/15 0557  BP: 143/58 153/71  Pulse: 87 86  Temp: 36.9 C 37 C  Resp: 20 20    Last Pain:  Filed Vitals:   11/04/15 0722  PainSc: 0-No pain                 VAN STAVEREN,Tanaka Gillen

## 2015-11-04 NOTE — Discharge Summary (Signed)
Patient ID: Alexis [name] MRN: [id number] DOB/AGE: 65/01/1951 65 y.o.  Admit date: 10/30/2015 Discharge date: 11/04/2015   Discharge Diagnoses:  Active Problems:   Acute appendicitis   Procedures:lap LOA and laparoscopic appendectomy  Hospital Course: 65 year old female admitted to the hospital for right lower quadrant pain workup revealing acute appendicitis. She was taken to the OR for laparoscopic appendectomy and extensive lysis of adhesions was performed. Dr. Azalee Course was the operating surgeon. The appendix was not ruptured but she did have some postoperative bruising and a drain was left in place. Postoperatively she developed increased output from the JP and actually her hemoglobin drop requiring transfusion. Her anticoagulants were stopped and she improved. Developed mild slight ileus which resolved. At the time of discharge she was ambulating, tolerating regular diet, her output had decrease and turned to serosanguinous. Her hb was greater than 9 and her vitals were stable. On her PE she was in NAD, awake alert, her abdomen was soft, NT, incisions were c/d/i. Condition at the time of DC is stable  Consults: IM for DM  Disposition: Final discharge disposition not confirmed  Discharge Instructions    Call MD for:  difficulty breathing, headache or visual disturbances    Complete by:  As directed      Call MD for:  extreme fatigue    Complete by:  As directed      Call MD for:  hives    Complete by:  As directed      Call MD for:  persistant dizziness or light-headedness    Complete by:  As directed      Call MD for:  persistant nausea and vomiting    Complete by:  As directed      Call MD for:  redness, tenderness, or signs of infection (pain, swelling, redness, odor or green/yellow discharge around incision site)    Complete by:  As directed      Call MD for:  severe uncontrolled pain    Complete by:  As directed      Call MD for:  temperature >100.4    Complete by:  As  directed      Diet - low sodium heart healthy    Complete by:  As directed      Discharge instructions    Complete by:  As directed   Please teach pt and family about JP care, empty BID. Shower daily     Increase activity slowly    Complete by:  As directed      Lifting restrictions    Complete by:  As directed   20 lbs 6 weeks     No dressing needed    Complete by:  As directed             Medication List    STOP taking these medications        aspirin 81 MG tablet      TAKE these medications        enalapril 20 MG tablet  Commonly known as:  VASOTEC  Take 1 tablet (20 mg total) by mouth daily.     insulin NPH-regular Human (70-30) 100 UNIT/ML injection  Commonly known as:  NOVOLIN 70/30  28 units in AM and 18 units in PM     Insulin Syringe-Needle U-100 30G X 5/16" 0.5 ML Misc  Commonly known as:  RELION INSULIN SYR 0.5CC/30G  Use to inject insulin twice daily as directed.  Diagnosis: E11.9.  Insulin dependent.  metFORMIN 1000 MG tablet  Commonly known as:  GLUCOPHAGE  Take 1 tablet (1,000 mg total) by mouth 2 (two) times daily with a meal.     oxyCODONE 5 MG immediate release tablet  Commonly known as:  Oxy IR/ROXICODONE  Take 1-2 tablets (5-10 mg total) by mouth every 4 (four) hours as needed for moderate pain.     propranolol 10 MG tablet  Commonly known as:  INDERAL  Take 1 tablet (10 mg total) by mouth 2 (two) times daily.     TYLENOL 500 MG tablet  Generic drug:  acetaminophen  Take 500 mg by mouth as needed. Take 1-2 at night for pain           Follow-up Information    Follow up with [facility]. Go on 11/11/2015.   Why:  Wednesday at 2:30pm for hospital follow-up.   Contact information:   [place]       Caroleen Hamman, MD FACS

## 2015-11-10 ENCOUNTER — Encounter: Payer: Self-pay | Admitting: Surgery

## 2015-11-10 ENCOUNTER — Telehealth: Payer: Self-pay | Admitting: Surgery

## 2015-11-10 ENCOUNTER — Ambulatory Visit (INDEPENDENT_AMBULATORY_CARE_PROVIDER_SITE_OTHER): Payer: Commercial Managed Care - HMO | Admitting: Surgery

## 2015-11-10 VITALS — BP 136/63 | HR 120 | Temp 99.7°F | Wt 215.0 lb

## 2015-11-10 DIAGNOSIS — K353 Acute appendicitis with localized peritonitis, without perforation or gangrene: Secondary | ICD-10-CM

## 2015-11-10 MED ORDER — AMOXICILLIN-POT CLAVULANATE 875-125 MG PO TABS: 1.0000 | ORAL_TABLET | Freq: Two times a day (BID) | ORAL | 0 refills | Status: DC

## 2015-11-10 NOTE — Telephone Encounter (Signed)
Patient had APPENDECTOMY LAPAROSCOPIC with Dr Azalee Course on 7/14. She had been running a low grade fever around 99 and this morning has not been feeling well and running a fever of 100.4. She also still has a drain in and it is brownish/pink in color and flaky. She thinks she may have an infection and would like

## 2015-11-10 NOTE — Telephone Encounter (Signed)
Spoke with patient and she stated she has increased pain at the drain site, increased redness, temperature 100.4 this morning.  Currently has temperature of 102.0. She stated the drainage has the appearance of creme of mushroom soup.    Spoke with [employer] and was instructed to send patient over to [facility] office as per Dr.loflin would meet patient there to be seen.  Patient was instructed to go to the [place] office to be seen today.  Patient verbalized understanding.

## 2015-11-10 NOTE — Patient Instructions (Signed)
Please give [place] a call if you have any questions or concerns.   Please start taking your antibiotic and finish them. This was sent to your pharmacy.  If you continue to have a fever in the next three days please give [place] a call.

## 2015-11-10 NOTE — Progress Notes (Signed)
65 yr old female who had acute appendicitis and Lap appy with extensive lysis of adhesions on [id number] complicated by post op bleed.  Patient called today with complaint of redness and pain around left sided drain site.  She also complained of fever starting yesterday to 100.4.  She states the fever came down with tylenol.  She has been eating without any nausea or vomiting, just still a decrease of appetite.  She stated that she started taking her metformin again and she began having some diarrhea.  She states that's what happens with the metformin, her blood sugar is running about 130 with that.    Vitals:   11/10/15 1537  BP: 136/63  Pulse: (!) 120  Temp: 99.7 F (37.6 C)   PE:  Gen: NAD Abd: soft, appropriately tender, JP drain with 25cc serous drainage, erythema and induration in 2cm area surrounding JP site, tender to palpation  Ext: 2+ pulses, no edema  A/P:  Healing well, but with some fever and likely infection around JP drain site, JP removed and started on Augmentin.  Will have her return next week for f/u. She was instructed to call if area does not improve in the next few days or if she worsens

## 2015-11-11 ENCOUNTER — Encounter: Payer: Commercial Managed Care - HMO | Admitting: Surgery

## 2015-11-20 ENCOUNTER — Encounter: Payer: Self-pay | Admitting: Surgery

## 2015-11-20 ENCOUNTER — Ambulatory Visit (INDEPENDENT_AMBULATORY_CARE_PROVIDER_SITE_OTHER): Payer: Commercial Managed Care - HMO | Admitting: Surgery

## 2015-11-20 VITALS — BP 127/76 | HR 93 | Temp 98.2°F | Ht 65.0 in | Wt 209.6 lb

## 2015-11-20 DIAGNOSIS — K353 Acute appendicitis with localized peritonitis, without perforation or gangrene: Secondary | ICD-10-CM

## 2015-11-20 NOTE — Patient Instructions (Signed)
Please call our office if you have questions or concerns.   

## 2015-11-20 NOTE — Progress Notes (Signed)
65 year old female who had a laparoscopic appendectomy and extensive lysis of adhesions on 7/14.  Patient is doing well. Patient states that her appetite is good and that she is having good bowel movements. Patient is now off of the antibiotics from the infection along her JP drain site. Patient states that the redness is much improved and she still has some slight drainage from the area but is much better than it was. Patient denies any fever or chills.  Vitals:   11/20/15 0904  BP: 127/76  Pulse: 93  Temp: 98.2 F (36.8 C)    PE:  Gen: NAD Abd: soft, non-tender, incision sites c/d/i no erythema or drainage, jp drain site in LLQ with some induration but no erythema and some fibrinous material but no purulence   A/P:  Patient healing well after having a laparoscopic appendectomy with extensive lysis of adhesions. Patient is now up and moving around and her appetite has improved. The pain and drainage from the drain site have now almost resolved and she is off of the antibiotic. Patient was instructed to abstain from lifting anything over 15-20 pounds for another 2 weeks but otherwise can return to normal activities. Follow up with any questions or concerns

## 2015-11-27 ENCOUNTER — Other Ambulatory Visit: Payer: Commercial Managed Care - HMO

## 2015-12-01 ENCOUNTER — Ambulatory Visit: Payer: Commercial Managed Care - HMO | Admitting: Family Medicine

## 2015-12-01 DIAGNOSIS — Z0289 Encounter for other administrative examinations: Secondary | ICD-10-CM

## 2015-12-02 ENCOUNTER — Telehealth: Payer: Self-pay | Admitting: Family Medicine

## 2015-12-02 NOTE — Telephone Encounter (Signed)
Patient did not come in for their appointment on 12/01/15 for 3 mo follow up  Please let me know if patient needs to be contacted immediately for follow up or no follow up needed.

## 2015-12-03 ENCOUNTER — Other Ambulatory Visit: Payer: Self-pay

## 2015-12-03 MED ORDER — METFORMIN HCL 1000 MG PO TABS: 1000.0000 mg | ORAL_TABLET | Freq: Two times a day (BID) | ORAL | 2 refills | Status: DC

## 2015-12-03 MED ORDER — PROPRANOLOL HCL 10 MG PO TABS: 10.0000 mg | ORAL_TABLET | Freq: Two times a day (BID) | ORAL | 2 refills | Status: DC

## 2015-12-03 MED ORDER — ENALAPRIL MALEATE 20 MG PO TABS: 20.0000 mg | ORAL_TABLET | Freq: Every day | ORAL | 2 refills | Status: DC

## 2015-12-03 NOTE — Telephone Encounter (Signed)
Pt left v/m requesting refills of enalapril,metformin, and propranolol to [facility] due to ins change; pt wants to keep insulin at [facility]. Spoke with Doree at [facility] and she d/c refills on 3 oral meds. Refills sent to [facility] mail order as requested. Pt voiced understanding.

## 2015-12-03 NOTE — Telephone Encounter (Signed)
Please try to contact patient for f/u.  See if she can some in the next 2-4 weeks.  Thanks.

## 2015-12-04 NOTE — Telephone Encounter (Signed)
Pt scheduled for 10/17

## 2015-12-07 ENCOUNTER — Other Ambulatory Visit: Payer: Self-pay | Admitting: *Deleted

## 2015-12-07 MED ORDER — GLUCOSE BLOOD VI STRP: ORAL_STRIP | 3 refills | Status: DC

## 2015-12-07 MED ORDER — ACCU-CHEK FASTCLIX LANCETS MISC: 3 refills | Status: DC

## 2015-12-29 ENCOUNTER — Telehealth: Payer: Self-pay | Admitting: Family Medicine

## 2015-12-29 NOTE — Telephone Encounter (Signed)
Pt has appt on 12/30/15 at 8:15 with Dr [name].

## 2015-12-29 NOTE — Telephone Encounter (Signed)
[facility] Patient Name: Alexis [name] DOB: [birth year] Initial Comment Caller has a possible yeast infection under her breast and her stomach Nurse Assessment Nurse: Dimas Chyle, RN, Dellis Filbert Date/Time (Eastern Time): 12/29/2015 3:49:03 PM Confirm and document reason for call. If symptomatic, describe symptoms. You must click the next button to save text entered. ---Caller has a possible yeast infection under her breast and her stomach. Had rash for a week and a half. Some itching and painful rash. Has the patient traveled out of the country within the last 30 days? ---No Does the patient have any new or worsening symptoms? ---Yes Will a triage be completed? ---Yes Related visit to physician within the last 2 weeks? ---No Does the PT have any chronic conditions? (i.e. diabetes, asthma, etc.) ---Yes List chronic conditions. ---Diabetes type 2 and HTN Is this a behavioral health or substance abuse call? ---No Guidelines Guideline Title Affirmed Question Affirmed Notes Shingles SEVERE pain (e.g., excruciating) Final Disposition User See Physician within 24 Hours Dimas Chyle, RN, [employer] Referrals REFERRED TO PCP OFFICE Disagree/Comply: Leta Baptist

## 2015-12-30 ENCOUNTER — Encounter: Payer: Self-pay | Admitting: Family Medicine

## 2015-12-30 ENCOUNTER — Ambulatory Visit (INDEPENDENT_AMBULATORY_CARE_PROVIDER_SITE_OTHER): Payer: Commercial Managed Care - HMO | Admitting: Family Medicine

## 2015-12-30 VITALS — BP 130/84 | HR 80 | Temp 99.2°F | Wt 211.2 lb

## 2015-12-30 DIAGNOSIS — L989 Disorder of the skin and subcutaneous tissue, unspecified: Secondary | ICD-10-CM | POA: Insufficient documentation

## 2015-12-30 DIAGNOSIS — D649 Anemia, unspecified: Secondary | ICD-10-CM | POA: Insufficient documentation

## 2015-12-30 DIAGNOSIS — R21 Rash and other nonspecific skin eruption: Secondary | ICD-10-CM | POA: Diagnosis not present

## 2015-12-30 LAB — CBC WITH DIFFERENTIAL/PLATELET
Basophils Absolute: 0 10*3/uL (ref 0.0–0.1)
Basophils Relative: 0.4 % (ref 0.0–3.0)
EOS PCT: 4.2 % (ref 0.0–5.0)
Eosinophils Absolute: 0.4 10*3/uL (ref 0.0–0.7)
HCT: 36.3 % (ref 36.0–46.0)
HEMOGLOBIN: 12.1 g/dL (ref 12.0–15.0)
Lymphocytes Relative: 29.5 % (ref 12.0–46.0)
Lymphs Abs: 3 10*3/uL (ref 0.7–4.0)
MCHC: 33.3 g/dL (ref 30.0–36.0)
MCV: 81.8 fl (ref 78.0–100.0)
MONO ABS: 0.7 10*3/uL (ref 0.1–1.0)
MONOS PCT: 6.9 % (ref 3.0–12.0)
Neutro Abs: 6.1 10*3/uL (ref 1.4–7.7)
Neutrophils Relative %: 59 % (ref 43.0–77.0)
Platelets: 342 10*3/uL (ref 150.0–400.0)
RBC: 4.43 Mil/uL (ref 3.87–5.11)
RDW: 14.6 % (ref 11.5–15.5)
WBC: 10.3 10*3/uL (ref 4.0–10.5)

## 2015-12-30 MED ORDER — NYSTATIN 100000 UNIT/GM EX POWD: Freq: Three times a day (TID) | CUTANEOUS | 1 refills | Status: DC

## 2015-12-30 MED ORDER — FLUCONAZOLE 150 MG PO TABS: 150.0000 mg | ORAL_TABLET | Freq: Once | ORAL | 0 refills | Status: AC

## 2015-12-30 NOTE — Progress Notes (Signed)
Pre visit review using our clinic review tool, if applicable. No additional management support is needed unless otherwise documented below in the visit note. 

## 2015-12-30 NOTE — Progress Notes (Signed)
Her father died.  Patient had appendicitis. Her grandson had to have heart surgery.  It has been an awful year for the patient. She is trying to work through the loss and all the changes.    She doesn't have abd pain now.  Her drain is out.  No fevers.  She is eating okay.  Post op anemia noted.    Last A1c improved.  D/w pt.  Sugar 161 this AM.  Usually 140-160 in the AMs.    Rash.  Under L breast and on R side.  Also on LLQ.  Itches.  No FCNAVD.  Irritated but not very painful o/w.  Tried etoh topically; that would sting.    Meds, vitals, and allergies reviewed.   ROS: Per HPI unless specifically indicated in ROS section   nad Tremor at baseline Typical yeast rash on the L upper and lower abd.  Chaperoned exam.

## 2015-12-30 NOTE — Assessment & Plan Note (Signed)
Looks like typical yeast infection, superficial.   Nystatin powder 3 times a day until resolved and then for 2 more days.   If not better, then use diflucan.  When better, she can use talc to stay dry.   D/w pt.  She agrees.

## 2015-12-30 NOTE — Patient Instructions (Signed)
Go to the lab on the way out.  We'll contact you with your lab report. Nystatin powder 3 times a day until resolved and then for 2 more days.   If not better, then use diflucan.  When better, you can use talc to stay dry.   Take care.  Glad to see you.

## 2015-12-30 NOTE — Assessment & Plan Note (Signed)
See notes on labs.  No bleeding.

## 2016-01-08 ENCOUNTER — Telehealth: Payer: Self-pay

## 2016-01-08 MED ORDER — CLOTRIMAZOLE-BETAMETHASONE 1-0.05 % EX CREA: 1.0000 "application " | TOPICAL_CREAM | Freq: Two times a day (BID) | CUTANEOUS | 0 refills | Status: DC

## 2016-01-08 NOTE — Telephone Encounter (Signed)
Change to lotrisone.  rx sent. Update me if not better.  Thanks.

## 2016-01-08 NOTE — Telephone Encounter (Signed)
Pt left /vm; pt was seen 12/30/15 with yeast under breast and Nystatin powder is not working; area under breast is worse than when seen on 12/30/15. Pt request cb with what to do. [facility] [place].

## 2016-01-08 NOTE — Telephone Encounter (Signed)
Patient advised.

## 2016-01-25 ENCOUNTER — Other Ambulatory Visit: Payer: Self-pay | Admitting: Family Medicine

## 2016-01-25 DIAGNOSIS — E1149 Type 2 diabetes mellitus with other diabetic neurological complication: Secondary | ICD-10-CM

## 2016-01-26 ENCOUNTER — Other Ambulatory Visit (INDEPENDENT_AMBULATORY_CARE_PROVIDER_SITE_OTHER): Payer: Commercial Managed Care - HMO

## 2016-01-26 DIAGNOSIS — E1149 Type 2 diabetes mellitus with other diabetic neurological complication: Secondary | ICD-10-CM | POA: Diagnosis not present

## 2016-01-26 LAB — HEMOGLOBIN A1C: HEMOGLOBIN A1C: 9.2 % — AB (ref 4.6–6.5)

## 2016-02-02 ENCOUNTER — Ambulatory Visit (INDEPENDENT_AMBULATORY_CARE_PROVIDER_SITE_OTHER): Payer: Commercial Managed Care - HMO | Admitting: Family Medicine

## 2016-02-02 ENCOUNTER — Encounter: Payer: Self-pay | Admitting: Family Medicine

## 2016-02-02 VITALS — BP 132/66 | HR 83 | Temp 98.5°F | Wt 214.8 lb

## 2016-02-02 DIAGNOSIS — E1149 Type 2 diabetes mellitus with other diabetic neurological complication: Secondary | ICD-10-CM

## 2016-02-02 DIAGNOSIS — Z23 Encounter for immunization: Secondary | ICD-10-CM | POA: Diagnosis not present

## 2016-02-02 NOTE — Patient Instructions (Addendum)
Recheck A1c in about 3 months before a visit.   You can call for a mammogram at [facility] at [facility].  [place]  Glad to see you.  Update me as needed.

## 2016-02-02 NOTE — Progress Notes (Signed)
Diabetes:  Using medications without difficulties:yes Hypoglycemic episodes:no Hyperglycemic episodes:yes Feet problems: some tingling at baseline. No new sx.  Blood Sugars averaging: 97 this AM, better recently.   eye exam within last year: due, d/w pt.   A1c d/w pt.  Up from prev as expected with the amount of upheaval.    Encouraged mammogram.  See AVS.    She is recovered from her appendectomy, healing and w/o much pain at the surgery site.  Appetite okay.  No fevers, no chills.    Her grandson is back at work after his heart surgery.  He is improving slowly.  She is adjusting to all of the changes at home with her father's death.  She is still caring for her mother.  Her brother in law recently died.    Meds, vitals, and allergies reviewed.   ROS: Per HPI unless specifically indicated in ROS section   GEN: nad, alert and oriented HEENT: mucous membranes moist NECK: supple w/o LA CV: rrr. PULM: ctab, no inc wob ABD: soft, +bs EXT: no edema Tremor at baseline.

## 2016-02-02 NOTE — Assessment & Plan Note (Signed)
Given the upheaval she has had at some, I expected her A1c to be up.  Recently with better sugars with her schedule normalizing.  Continue as is, recheck in about 3 months.  She'll update me as needed.   Home stressors discussed, hopefully getting some better in the near future . She'll call about mammogram and eye exam.

## 2016-02-02 NOTE — Progress Notes (Signed)
Pre visit review using our clinic review tool, if applicable. No additional management support is needed unless otherwise documented below in the visit note. 

## 2016-03-15 ENCOUNTER — Ambulatory Visit (INDEPENDENT_AMBULATORY_CARE_PROVIDER_SITE_OTHER): Payer: Commercial Managed Care - HMO | Admitting: Family Medicine

## 2016-03-15 ENCOUNTER — Encounter: Payer: Self-pay | Admitting: Family Medicine

## 2016-03-15 DIAGNOSIS — B029 Zoster without complications: Secondary | ICD-10-CM | POA: Diagnosis not present

## 2016-03-15 MED ORDER — VALACYCLOVIR HCL 1 G PO TABS: 1000.0000 mg | ORAL_TABLET | Freq: Three times a day (TID) | ORAL | 0 refills | Status: DC

## 2016-03-15 NOTE — Progress Notes (Signed)
Pre visit review using our clinic review tool, if applicable. No additional management support is needed unless otherwise documented below in the visit note. 

## 2016-03-15 NOTE — Patient Instructions (Signed)
Start valtrex.  Keep the area covered when out in public.  Take care.  Glad to see you.  If pain is worse then let me know.

## 2016-03-15 NOTE — Progress Notes (Signed)
Dermatomal rash on the R side of trunk.  Started about 1 day ago.  Blistered.  Itching and irritated, stinging sensation.  No L sided sx.  No other rash.    Meds, vitals, and allergies reviewed.   ROS: Per HPI unless specifically indicated in ROS section   nad Dermatomal rash on the R upper abd with change in sensation along the dermatome.  No ulceration.  No other rash.  Head tremor noted at baseline.

## 2016-03-15 NOTE — Assessment & Plan Note (Signed)
Minimal pain, early in the disease process.  Start valtrex.  Routine cautions.  D/w pt about path/phys.  Update me as needed.  See AVS.

## 2016-04-05 ENCOUNTER — Telehealth: Payer: Self-pay

## 2016-04-05 MED ORDER — FLUCONAZOLE 150 MG PO TABS: ORAL_TABLET | ORAL | 0 refills | Status: DC

## 2016-04-05 NOTE — Telephone Encounter (Signed)
Pt left /vm; pt seen 03/15/16 and given valacyclovir for shingles; now pt has yeast infection and request med sent to [facility].Please advise.

## 2016-04-05 NOTE — Telephone Encounter (Signed)
Sent. Thanks.   

## 2016-04-18 ENCOUNTER — Other Ambulatory Visit: Payer: Self-pay | Admitting: Family Medicine

## 2016-04-18 DIAGNOSIS — E1149 Type 2 diabetes mellitus with other diabetic neurological complication: Secondary | ICD-10-CM

## 2016-04-27 ENCOUNTER — Other Ambulatory Visit (INDEPENDENT_AMBULATORY_CARE_PROVIDER_SITE_OTHER): Payer: Medicare HMO

## 2016-04-27 DIAGNOSIS — E1149 Type 2 diabetes mellitus with other diabetic neurological complication: Secondary | ICD-10-CM

## 2016-04-27 LAB — HEMOGLOBIN A1C

## 2016-05-04 ENCOUNTER — Ambulatory Visit: Payer: Commercial Managed Care - HMO | Admitting: Family Medicine

## 2016-05-09 ENCOUNTER — Encounter: Payer: Self-pay | Admitting: Family Medicine

## 2016-05-09 ENCOUNTER — Ambulatory Visit (INDEPENDENT_AMBULATORY_CARE_PROVIDER_SITE_OTHER): Payer: Medicare HMO | Admitting: Family Medicine

## 2016-05-09 VITALS — BP 128/84 | HR 84 | Temp 98.9°F | Wt 220.5 lb

## 2016-05-09 DIAGNOSIS — E1149 Type 2 diabetes mellitus with other diabetic neurological complication: Secondary | ICD-10-CM

## 2016-05-09 DIAGNOSIS — B379 Candidiasis, unspecified: Secondary | ICD-10-CM | POA: Insufficient documentation

## 2016-05-09 MED ORDER — INSULIN NPH ISOPHANE & REGULAR (70-30) 100 UNIT/ML ~~LOC~~ SUSP: SUBCUTANEOUS | 12 refills | Status: DC

## 2016-05-09 MED ORDER — NYSTATIN 100000 UNIT/GM EX POWD: Freq: Three times a day (TID) | CUTANEOUS | 1 refills | Status: DC

## 2016-05-09 MED ORDER — FLUCONAZOLE 150 MG PO TABS: ORAL_TABLET | ORAL | 0 refills | Status: DC

## 2016-05-09 NOTE — Patient Instructions (Addendum)
Use monistat.  Use nystatin powder under the breasts.  Restart diflucan.  Increase your insulin in the meantime.  See med list.   Update me in about 1 week, sooner if needed.  Take care.  Glad to see you.

## 2016-05-09 NOTE — Assessment & Plan Note (Addendum)
Likely. D/w pt.  Start nystatin under the breasts, use monistat.  Start diflucan.  See DM discussion.  D/w pt about keeping tissue dry.

## 2016-05-09 NOTE — Progress Notes (Signed)
Still with L sided chest wall shingles.  Still with sig pain.  Never had L sided pain.  Now with discharge from the area.  No fevers in the last few weeks.  Done with valtrex.    Possible vaginal bleeding.  She had some discharge, some irritation and scant bleeding when wiping.  No burning with urination.  Not like a period.  H/o hysterectomy.  Diflucan prev didn't fix the issue, maybe helped some.    A1c 10.5, d/w pt.  Sugar was 222 this AM. D/w pt.    Meds, vitals, and allergies reviewed.   ROS: Per HPI unless specifically indicated in ROS section   nad ncat rrr ctab Chaperoned exam.  R>L superficial fungal infection under the breasts.    WP with no trich, no clue cells, + yeast.

## 2016-05-09 NOTE — Assessment & Plan Note (Signed)
Needs higher dose of insulin, weight loss, diet and exercise.  Inc insulin, see AVS and update me in about 1 week.

## 2016-05-09 NOTE — Progress Notes (Signed)
Pre visit review using our clinic review tool, if applicable. No additional management support is needed unless otherwise documented below in the visit note. 

## 2016-05-17 ENCOUNTER — Telehealth: Payer: Self-pay

## 2016-05-17 MED ORDER — FLUCONAZOLE 150 MG PO TABS: ORAL_TABLET | ORAL | 0 refills | Status: DC

## 2016-05-17 NOTE — Telephone Encounter (Signed)
Glad sugar is better. Would take diflucan twice a week, ie today and Friday, repeat twice next week if needed.   New rx sent.  That should help.  Thanks.

## 2016-05-17 NOTE — Telephone Encounter (Signed)
Patient notified as instructed by telephone and verbalized understanding. 

## 2016-05-17 NOTE — Telephone Encounter (Signed)
Pt left v/m; pt seen 05/09/16; blood sugar is doing better; readings between 130 - 150. Rash under breast is worse; powder is not working and pt request different med. [facility] [place].

## 2016-06-16 ENCOUNTER — Telehealth: Payer: Self-pay | Admitting: Family Medicine

## 2016-06-16 ENCOUNTER — Encounter: Payer: Self-pay | Admitting: Family Medicine

## 2016-06-16 DIAGNOSIS — R21 Rash and other nonspecific skin eruption: Secondary | ICD-10-CM

## 2016-06-16 NOTE — Telephone Encounter (Signed)
Please call pt.   1. Her mother died, please offer my condolences.  2. Her breast rash isn't better, please get an update (location, severity, etc).  See if any temporary relief with prev pills/topical tx.    Thanks.

## 2016-06-16 NOTE — Telephone Encounter (Signed)
Called pt and no answer and no voicemail set up 

## 2016-06-17 MED ORDER — FLUCONAZOLE 150 MG PO TABS: ORAL_TABLET | ORAL | 0 refills | Status: DC

## 2016-06-17 NOTE — Telephone Encounter (Signed)
Spoke with patient and instructions given.  She does report that sugars have come down from previous month but is running on average: 130's - 150's with an occasional 200 reading (but rare)  She is anxious for the derm referral as she has become quite uncomfortable.  I did inform patient that she should hear back on the referral by mid next week and to call us for any further concerns.

## 2016-06-17 NOTE — Telephone Encounter (Signed)
She is likely going to need tighter sugar control.  Okay to restart diflucan.  Sent.   I put in derm referral.  How is her sugar running?  Please let me know.   Thanks.

## 2016-06-17 NOTE — Telephone Encounter (Signed)
Rash is still spreading on both sides of breast and going to stomach. She states its not going away, no blisters, states its red and still itching. She wants to know is there anything you can do that will help it go away. Does she need a referral to Dermatology?

## 2016-06-19 NOTE — Telephone Encounter (Signed)
Thanks.  Alexis [name]- please ask for the next available derm appointment.  Thanks.

## 2016-06-20 ENCOUNTER — Telehealth: Payer: Self-pay | Admitting: Family Medicine

## 2016-06-20 NOTE — Telephone Encounter (Signed)
Referral faxed to [facility], patient is aware that they will call her to schedule.

## 2016-06-20 NOTE — Telephone Encounter (Signed)
Called pt: no vm set up.Time to schedule AWV + labs with Lesia and CPE with PCP. °

## 2016-06-22 DIAGNOSIS — L408 Other psoriasis: Secondary | ICD-10-CM | POA: Diagnosis not present

## 2016-07-19 NOTE — Telephone Encounter (Signed)
Called pt: no vm set up.Time to schedule AWV + labs with Lesia and CPE with PCP. °

## 2016-07-20 DIAGNOSIS — L408 Other psoriasis: Secondary | ICD-10-CM | POA: Diagnosis not present

## 2016-07-20 DIAGNOSIS — L821 Other seborrheic keratosis: Secondary | ICD-10-CM | POA: Diagnosis not present

## 2016-07-20 DIAGNOSIS — B078 Other viral warts: Secondary | ICD-10-CM | POA: Diagnosis not present

## 2016-07-20 DIAGNOSIS — L538 Other specified erythematous conditions: Secondary | ICD-10-CM | POA: Diagnosis not present

## 2016-09-01 ENCOUNTER — Other Ambulatory Visit: Payer: Self-pay | Admitting: Family Medicine

## 2016-09-01 NOTE — Telephone Encounter (Signed)
Electronic refill request.  No recent CPE. Last Filled:   Propranolol  180 tablet 2 12/03/2015  Last Filled:   Metformin 180 tablet 2 12/03/2015  Last Filled:   Enalapril  90 tablet 2 12/03/2015

## 2016-09-02 NOTE — Telephone Encounter (Signed)
Sent. Thanks.   

## 2016-09-29 ENCOUNTER — Encounter: Payer: Self-pay | Admitting: Family Medicine

## 2016-09-29 ENCOUNTER — Ambulatory Visit (INDEPENDENT_AMBULATORY_CARE_PROVIDER_SITE_OTHER): Payer: Medicare HMO | Admitting: Family Medicine

## 2016-09-29 VITALS — BP 130/80 | HR 85 | Temp 98.5°F | Wt 214.5 lb

## 2016-09-29 DIAGNOSIS — E7800 Pure hypercholesterolemia, unspecified: Secondary | ICD-10-CM | POA: Diagnosis not present

## 2016-09-29 DIAGNOSIS — Z1211 Encounter for screening for malignant neoplasm of colon: Secondary | ICD-10-CM | POA: Diagnosis not present

## 2016-09-29 DIAGNOSIS — R251 Tremor, unspecified: Secondary | ICD-10-CM

## 2016-09-29 DIAGNOSIS — R1310 Dysphagia, unspecified: Secondary | ICD-10-CM

## 2016-09-29 DIAGNOSIS — E1149 Type 2 diabetes mellitus with other diabetic neurological complication: Secondary | ICD-10-CM

## 2016-09-29 LAB — LIPID PANEL
Cholesterol: 185 mg/dL (ref 0–200)
HDL: 29 mg/dL — AB (ref >39.00)
NonHDL: 155.68
Total CHOL/HDL Ratio: 6
Triglycerides: 208 mg/dL — ABNORMAL HIGH (ref 0.0–149.0)
VLDL: 41.6 mg/dL — AB (ref 0.0–40.0)

## 2016-09-29 LAB — COMPREHENSIVE METABOLIC PANEL
ALK PHOS: 92 U/L (ref 39–117)
ALT: 10 U/L (ref 0–35)
AST: 14 U/L (ref 0–37)
Albumin: 3.9 g/dL (ref 3.5–5.2)
BUN: 12 mg/dL (ref 6–23)
CO2: 30 mEq/L (ref 19–32)
Calcium: 9.6 mg/dL (ref 8.4–10.5)
Chloride: 103 mEq/L (ref 96–112)
Creatinine, Ser: 0.72 mg/dL (ref 0.40–1.20)
GFR: 86.06 mL/min (ref >60.00)
GLUCOSE: 162 mg/dL — AB (ref 70–99)
POTASSIUM: 4.9 meq/L (ref 3.5–5.1)
Sodium: 139 mEq/L (ref 135–145)
TOTAL PROTEIN: 7.6 g/dL (ref 6.0–8.3)
Total Bilirubin: 0.4 mg/dL (ref 0.2–1.2)

## 2016-09-29 LAB — HEMOGLOBIN A1C: HEMOGLOBIN A1C: 10.7 % — AB (ref 4.6–6.5)

## 2016-09-29 LAB — LDL CHOLESTEROL, DIRECT: Direct LDL: 120 mg/dL

## 2016-09-29 NOTE — Assessment & Plan Note (Addendum)
See notes on labs. Continue work on Art therapist, no change in meds at this point, d/w pt.  >25 minutes spent in face to face time with patient, >50% spent in counselling or coordination of care.

## 2016-09-29 NOTE — Assessment & Plan Note (Signed)
Continue as is with BB, tolerating med and sx.

## 2016-09-29 NOTE — Progress Notes (Signed)
Diabetes:  Using medications without difficulties: yes Hypoglycemic episodes:only if prolonged fasting, down to 70s.  This is really rare.   Hyperglycemic episodes:no Feet problems: still with some tingling in the feet, some night pain.  Worse when sugar is up.   Blood Sugars averaging: usually ~ 170s, occ higher.  This AM 143.   eye exam within last year: d/w pt.  Due for A1c. D/w pt.    Noted upheaval with the death of her parents.  She is trying to work through the situation.  I offered condolences.  D/w pt.   HLD.  Due for labs.  Not on a statin.  The bigger issue had been DM2, d/w pt.  Was prev on statin but not recently.  She may have had more leg cramps with a statin medicine but that wasn't clearly recalled by patient.    Some occ tightness with swallowing now but not complete food sticking, with similar in the past that improved with dilation by EGD.  D/w pt.  Some cough.  Due for colonoscopy.  No blood in stool.    Tremor is tolerable. No ADE on meds.    PMH and SH reviewed  Meds, vitals, and allergies reviewed.   ROS: Per HPI unless specifically indicated in ROS section   GEN: nad, alert and oriented HEENT: mucous membranes moist NECK: supple w/o LA CV: rrr. PULM: ctab, no inc wob ABD: soft, +bs EXT: no edema SKIN: no acute rash Tremor at baseline  Diabetic foot exam: Normal inspection except for R 1st toe bruised here a shovel fell on her foot.  Not ttp  No skin breakdown No calluses  Normal DP pulses Dec sensation to light touch and monofilament Nails normal

## 2016-09-29 NOTE — Patient Instructions (Addendum)
Go to the lab on the way out.  We'll contact you with your lab report. Call about an eye exam.  You can call for a mammogram at [facility] at [facility].  [place] will call about your referral to GI.  Don't change your meds for now.  Take care.  Glad to see you.

## 2016-09-29 NOTE — Assessment & Plan Note (Signed)
See notes on labs.  D/w pt about diet/weight.  May need retrial of statin to see if she can tolerate.

## 2016-09-29 NOTE — Assessment & Plan Note (Signed)
Prev EGD dilation, may need repeat.  Refer.  Also needs colon can screening.  D/w pt.

## 2016-10-04 ENCOUNTER — Telehealth: Payer: Self-pay | Admitting: Family Medicine

## 2016-10-04 NOTE — Telephone Encounter (Signed)
Pt spouse returned your call

## 2016-10-12 ENCOUNTER — Telehealth: Payer: Self-pay

## 2016-10-12 NOTE — Telephone Encounter (Signed)
Sherri pharmacist with [facility] pharmacy left v/m requesting cb about status of fax sent to [facility] about pt being started on statin due to pt being diabetic; (pt at increased risk of heart attack or stroke).

## 2016-10-13 NOTE — Telephone Encounter (Signed)
Possibly intolerant to statin and we plan on re-addressing this in the future. However if the people at [facility] would prefer to handle this on their own and contact the patient, counsel the patient, prescribe the medicine and monitor her labs and side effects I'll be glad to defer to them. Thanks.

## 2016-10-14 ENCOUNTER — Encounter: Payer: Self-pay | Admitting: Family Medicine

## 2016-10-14 ENCOUNTER — Ambulatory Visit (INDEPENDENT_AMBULATORY_CARE_PROVIDER_SITE_OTHER): Payer: Medicare HMO | Admitting: Family Medicine

## 2016-10-14 DIAGNOSIS — H00019 Hordeolum externum unspecified eye, unspecified eyelid: Secondary | ICD-10-CM | POA: Diagnosis not present

## 2016-10-14 DIAGNOSIS — R059 Cough, unspecified: Secondary | ICD-10-CM

## 2016-10-14 DIAGNOSIS — R05 Cough: Secondary | ICD-10-CM

## 2016-10-14 MED ORDER — LORATADINE 10 MG PO TABS: 10.0000 mg | ORAL_TABLET | Freq: Every day | ORAL | Status: AC

## 2016-10-14 MED ORDER — BENZONATATE 200 MG PO CAPS: 200.0000 mg | ORAL_CAPSULE | Freq: Three times a day (TID) | ORAL | 0 refills | Status: DC | PRN

## 2016-10-14 NOTE — Patient Instructions (Addendum)
Likely a stye on the eyelid.  Use warm compresses.  Likely allergies contributing to the cough.  Try tessalon for the cough and start taking claritin but not claritin D.  Take care.  Glad to see you.  Update me as needed.

## 2016-10-14 NOTE — Progress Notes (Signed)
L upper eyelid tenderness and swelling for the last few day.  No vision change except for L upper eyelid obstruction. No sx like this prev.  Wears glasses.  No contacts.  No eye trauma.    Cough, raspy, going on for a few weeks.  No objective fevers but felt hot.  Some sputum, a little, usually white.  No vomiting, no diarrhea.  Scant/occ wheeze.  Some occ L ear pain.  No facial pain except for the eyelid sx on the L.  The cough is worse at night.  Cough drops help some.    Dm2.  She is up to 36 and 26 units in AM/PM.  He sugar improved some with the higher dose but then drifted up again to the 200s.  Prev was <<200.  D/wpt about slow inc in insulin based on her sugars, since last A1c was up.  No low sugars, none <70.    Meds, vitals, and allergies reviewed.   ROS: Per HPI unless specifically indicated in ROS section   GEN: nad, alert and oriented HEENT: mucous membranes moist, R eyelids wnl, L upper eyelid red and puffy with a stye NECK: supple w/o LA CV: rrr.  PULM: ctab, no inc wob ABD: soft, +bs EXT: no edema SKIN: no acute rash Head tremor noted.

## 2016-10-16 DIAGNOSIS — R059 Cough, unspecified: Secondary | ICD-10-CM | POA: Insufficient documentation

## 2016-10-16 DIAGNOSIS — R05 Cough: Secondary | ICD-10-CM | POA: Insufficient documentation

## 2016-10-16 DIAGNOSIS — H00019 Hordeolum externum unspecified eye, unspecified eyelid: Secondary | ICD-10-CM | POA: Insufficient documentation

## 2016-10-16 NOTE — Assessment & Plan Note (Signed)
Likely a stye on the eyelid.  Use warm compresses. Update me as needed. Discussed with patient. We can refer if needed but these almost always resolve on their own.

## 2016-10-16 NOTE — Assessment & Plan Note (Signed)
Likely allergies contributing to the cough.  Try tessalon for the cough and start taking claritin but not claritin D.  Update me as needed.   She agrees.

## 2016-10-17 DIAGNOSIS — R921 Mammographic calcification found on diagnostic imaging of breast: Secondary | ICD-10-CM | POA: Diagnosis not present

## 2016-10-17 DIAGNOSIS — Z1231 Encounter for screening mammogram for malignant neoplasm of breast: Secondary | ICD-10-CM | POA: Diagnosis not present

## 2016-10-17 LAB — HM MAMMOGRAPHY

## 2016-10-27 ENCOUNTER — Encounter: Payer: Self-pay | Admitting: Family Medicine

## 2016-10-31 ENCOUNTER — Encounter: Payer: Self-pay | Admitting: Internal Medicine

## 2016-10-31 ENCOUNTER — Encounter (INDEPENDENT_AMBULATORY_CARE_PROVIDER_SITE_OTHER): Payer: Self-pay

## 2016-10-31 ENCOUNTER — Ambulatory Visit (INDEPENDENT_AMBULATORY_CARE_PROVIDER_SITE_OTHER): Payer: Medicare HMO | Admitting: Internal Medicine

## 2016-10-31 VITALS — BP 120/70 | HR 80 | Ht 62.0 in | Wt 218.2 lb

## 2016-10-31 DIAGNOSIS — R1310 Dysphagia, unspecified: Secondary | ICD-10-CM | POA: Diagnosis not present

## 2016-10-31 DIAGNOSIS — R1319 Other dysphagia: Secondary | ICD-10-CM

## 2016-10-31 DIAGNOSIS — Z86010 Personal history of colonic polyps: Secondary | ICD-10-CM | POA: Diagnosis not present

## 2016-10-31 DIAGNOSIS — K219 Gastro-esophageal reflux disease without esophagitis: Secondary | ICD-10-CM | POA: Diagnosis not present

## 2016-10-31 DIAGNOSIS — K222 Esophageal obstruction: Secondary | ICD-10-CM | POA: Diagnosis not present

## 2016-10-31 NOTE — Progress Notes (Signed)
HISTORY OF PRESENT ILLNESS:  Alexis [name] is a 66 y.o. female with insulin requiring diabetes mellitus, GERD complicated by peptic stricture requiring esophageal dilation, and multiple and advanced adenomatous colon polyps with family history of colon cancer in her father and sister. Patient presents today with chief complaints of recurrent dysphagia and the need for surveillance colonoscopy. Initial colonoscopy in [facility] April 2009 with tubular adenoma. Colonoscopy here in September 2013 with 3 tubular adenomas the largest measuring 10 mm. Follow-up in 3 years recommended. Patient did receive a recall letter reports that she had family issues to attend to, thus the delay in scheduling. In any event, no lower GI complaints other than occasional minor rectal bleeding which she attributes to hemorrhoids. For GERD she takes omeprazole 20 mg daily. On medication classic symptoms are controlled. She does report intermittent solid food dysphagia which has worsened in recent months. Upper endoscopy September 2013 reveal a distal esophageal stricture which was dilated to [place]. Patient states that this helped. In addition to insulin she takes metformin for her diabetes. She is status post interval appendectomy since her last visit CT scan from 2017 with appendicitis. Review of blood work from June 2018 find unremarkable comprehensive metabolic panel  REVIEW OF SYSTEMS:  All non-GI ROS negative unless otherwise stated in the history of present illness except for visual change, cough, nose bleeds, skin rash  Past Medical History:  Diagnosis Date  . Allergy   . Asthma    in childhood  . Blood in stool   . Diabetes mellitus    dx'd at age ~85  . GERD (gastroesophageal reflux disease)   . Hx of colonic polyps   . Hyperlipidemia   . Hypertension    dx'd at age ~66  . Sleep apnea    on CPAP  . Tremor    only in face; worse under periods of stress. father with tremor also    Past  Surgical History:  Procedure Laterality Date  . [place]   Surgery included removal of benign pelvic tumor  . LAPAROSCOPIC APPENDECTOMY N/A 10/30/2015   Procedure: APPENDECTOMY LAPAROSCOPIC, lysis of adhesions;  Surgeon: Hubbard Robinson, MD;  Location: [facility] ORS;  Service: General;  Laterality: N/A;  . OVARY BIOPSY  1988  . pelvic bone tumor  1988  . [place]    Social History KEONNA RAETHER  reports that she has never smoked. She has never used smokeless tobacco. She reports that she does not drink alcohol or use drugs.  family history includes Colon cancer in her father and sister; Heart disease in her mother; Lung cancer in her maternal grandfather; Uterine cancer in her sister.  No Known Allergies     PHYSICAL EXAMINATION: Vital signs: BP 120/70 (BP Location: Left Arm, Patient Position: Sitting, Cuff Size: Normal)   Pulse 80   Ht 5\' 2"  (1.575 m) Comment: height measured without shoes  Wt 218 lb 4 oz (99 kg)   BMI 39.92 kg/m   Constitutional: generally well-appearing, no acute distress Psychiatric: alert and oriented x3, cooperative Eyes: extraocular movements intact, anicteric, conjunctiva pink Mouth: oral pharynx moist, no lesions Neck: supple Without thyromegaly Lymph: no lymphadenopathy Cardiovascular: heart regular rate and rhythm, no murmur Lungs: clear to auscultation bilaterally Abdomen: soft, obese, nontender, nondistended, no obvious ascites, no peritoneal signs, normal bowel sounds, no organomegaly Rectal: Deferred until colonoscopy Extremities: no clubbing cyanosis or lower extremity edema bilaterally Skin: no lesions on visible extremities  Neuro: No focal deficits. Cranial nerves intact  ASSESSMENT:  #1. GERD. Classic symptoms controlled with omeprazole 20 mg daily #2. Recurrent dysphagia. Likely secondary to known distal esophageal stricture previously dilated #3. Personal history of multiple and advanced  adenomatous colon polyps and history of colon cancer in 2 first-degree relatives. Overdue for surveillance #4. Multiple medical problems including insulin requiring diabetes mellitus #5. Morbid obesity  PLAN:  #1. Reflux precautions with attention to weight loss. Reviewed #2. Continue omeprazole 20 mg daily #3. Schedule upper endoscopy with esophageal dilation. The patient is higher than baseline risk due to her comorbidities and the need to address diabetic therapies.The nature of the procedure, as well as the risks, benefits, and alternatives were carefully and thoroughly reviewed with the patient. Ample time for discussion and questions allowed. The patient understood, was satisfied, and agreed to proceed. #4. Schedule surveillance colonoscopy. High-risk as above.The nature of the procedure, as well as the risks, benefits, and alternatives were carefully and thoroughly reviewed with the patient. Ample time for discussion and questions allowed. The patient understood, was satisfied, and agreed to proceed. #5. Hold diabetic medications the morning of the procedures in order to avoid and wanted hypoglycemia

## 2016-10-31 NOTE — Patient Instructions (Signed)

## 2016-11-02 ENCOUNTER — Encounter: Payer: Self-pay | Admitting: Emergency Medicine

## 2017-01-12 ENCOUNTER — Ambulatory Visit (AMBULATORY_SURGERY_CENTER): Payer: Self-pay

## 2017-01-12 VITALS — Ht 62.5 in | Wt 219.4 lb

## 2017-01-12 DIAGNOSIS — K219 Gastro-esophageal reflux disease without esophagitis: Secondary | ICD-10-CM

## 2017-01-12 DIAGNOSIS — Z860100 Personal history of colon polyps, unspecified: Secondary | ICD-10-CM

## 2017-01-12 DIAGNOSIS — K222 Esophageal obstruction: Secondary | ICD-10-CM

## 2017-01-12 DIAGNOSIS — R1319 Other dysphagia: Secondary | ICD-10-CM

## 2017-01-12 MED ORDER — SUPREP BOWEL PREP KIT 17.5-3.13-1.6 GM/177ML PO SOLN: 1.0000 | Freq: Once | ORAL | 0 refills | Status: AC

## 2017-01-12 NOTE — Progress Notes (Signed)
No allergies to eggs or soy No diet meds No home oxygen No past problems with anesthesia  Declined emmi 

## 2017-01-13 ENCOUNTER — Encounter: Payer: Self-pay | Admitting: Internal Medicine

## 2017-01-20 ENCOUNTER — Ambulatory Visit (AMBULATORY_SURGERY_CENTER): Payer: Medicare HMO | Admitting: Internal Medicine

## 2017-01-20 ENCOUNTER — Encounter: Payer: Self-pay | Admitting: Internal Medicine

## 2017-01-20 VITALS — BP 130/85 | HR 81 | Temp 99.3°F | Resp 10 | Ht 62.5 in | Wt 219.0 lb

## 2017-01-20 DIAGNOSIS — D122 Benign neoplasm of ascending colon: Secondary | ICD-10-CM

## 2017-01-20 DIAGNOSIS — G4733 Obstructive sleep apnea (adult) (pediatric): Secondary | ICD-10-CM | POA: Diagnosis not present

## 2017-01-20 DIAGNOSIS — R1310 Dysphagia, unspecified: Secondary | ICD-10-CM

## 2017-01-20 DIAGNOSIS — Z86010 Personal history of colonic polyps: Secondary | ICD-10-CM

## 2017-01-20 DIAGNOSIS — E669 Obesity, unspecified: Secondary | ICD-10-CM | POA: Diagnosis not present

## 2017-01-20 DIAGNOSIS — K635 Polyp of colon: Secondary | ICD-10-CM | POA: Diagnosis not present

## 2017-01-20 DIAGNOSIS — E119 Type 2 diabetes mellitus without complications: Secondary | ICD-10-CM | POA: Diagnosis not present

## 2017-01-20 DIAGNOSIS — D125 Benign neoplasm of sigmoid colon: Secondary | ICD-10-CM | POA: Diagnosis not present

## 2017-01-20 DIAGNOSIS — I10 Essential (primary) hypertension: Secondary | ICD-10-CM | POA: Diagnosis not present

## 2017-01-20 DIAGNOSIS — R1319 Other dysphagia: Secondary | ICD-10-CM

## 2017-01-20 DIAGNOSIS — K219 Gastro-esophageal reflux disease without esophagitis: Secondary | ICD-10-CM

## 2017-01-20 DIAGNOSIS — K222 Esophageal obstruction: Secondary | ICD-10-CM

## 2017-01-20 HISTORY — PX: COLONOSCOPY: SHX174

## 2017-01-20 HISTORY — PX: UPPER GI ENDOSCOPY: SHX6162

## 2017-01-20 MED ORDER — SODIUM CHLORIDE 0.9 % IV SOLN
500.0000 mL | INTRAVENOUS | Status: DC
Start: 2017-01-20 — End: 2017-01-20

## 2017-01-20 NOTE — Patient Instructions (Signed)
Impression/Recommendations:  Dilation diet handout given to patient. Polyp handout given to patient. Diverticulosis handout given to patient. Hemorrhoid handout given to patient.  Continue present medications.  Repeat colonoscopy in 5 years for surveillance.  Await pathology results. Resume previous diet, following recovery from dilation procedure diet handout.  YOU HAD AN ENDOSCOPIC PROCEDURE TODAY AT [facility]:   Refer to the procedure report that was given to you for any specific questions about what was found during the examination.  If the procedure report does not answer your questions, please call your gastroenterologist to clarify.  If you requested that your care partner not be given the details of your procedure findings, then the procedure report has been included in a sealed envelope for you to review at your convenience later.  YOU SHOULD EXPECT: Some feelings of bloating in the abdomen. Passage of more gas than usual.  Walking can help get rid of the air that was put into your GI tract during the procedure and reduce the bloating. If you had a lower endoscopy (such as a colonoscopy or flexible sigmoidoscopy) you may notice spotting of blood in your stool or on the toilet paper. If you underwent a bowel prep for your procedure, you may not have a normal bowel movement for a few days.  Please Note:  You might notice some irritation and congestion in your nose or some drainage.  This is from the oxygen used during your procedure.  There is no need for concern and it should clear up in a day or so.  SYMPTOMS TO REPORT IMMEDIATELY:   Following lower endoscopy (colonoscopy or flexible sigmoidoscopy):  Excessive amounts of blood in the stool  Significant tenderness or worsening of abdominal pains  Swelling of the abdomen that is new, acute  Fever of 100F or higher   Following upper endoscopy (EGD)  Vomiting of blood or coffee ground material  New chest pain  or pain under the shoulder blades  Painful or persistently difficult swallowing  New shortness of breath  Fever of 100F or higher  Black, tarry-looking stools  For urgent or emergent issues, a gastroenterologist can be reached at any hour by calling [phone].   DIET:  We do recommend a small meal at first, but then you may proceed to your regular diet.  Drink plenty of fluids but you should avoid alcoholic beverages for 24 hours.  ACTIVITY:  You should plan to take it easy for the rest of today and you should NOT DRIVE or use heavy machinery until tomorrow (because of the sedation medicines used during the test).    FOLLOW UP: Our staff will call the number listed on your records the next business day following your procedure to check on you and address any questions or concerns that you may have regarding the information given to you following your procedure. If we do not reach you, we will leave a message.  However, if you are feeling well and you are not experiencing any problems, there is no need to return our call.  We will assume that you have returned to your regular daily activities without incident.  If any biopsies were taken you will be contacted by phone or by letter within the next 1-3 weeks.  Please call us at [phone] if you have not heard about the biopsies in 3 weeks.    SIGNATURES/CONFIDENTIALITY: You and/or your care partner have signed paperwork which will be entered into your electronic medical record.  These signatures attest to the fact that that the information above on your After Visit Summary has been reviewed and is understood.  Full responsibility of the confidentiality of this discharge information lies with you and/or your care-partner.

## 2017-01-20 NOTE — Progress Notes (Signed)
To recovery, report to RN, VSS. 

## 2017-01-20 NOTE — Progress Notes (Deleted)
Called to room to assist during endoscopic procedure.  Patient ID and intended procedure confirmed with present staff. Received instructions for my participation in the procedure from the performing physician.  

## 2017-01-20 NOTE — Progress Notes (Signed)
Pt's states no medical or surgical changes since previsit or office visit. 

## 2017-01-20 NOTE — Progress Notes (Signed)
Called to room to assist during endoscopic procedure.  Patient ID and intended procedure confirmed with present staff. Received instructions for my participation in the procedure from the performing physician.  

## 2017-01-20 NOTE — Op Note (Signed)
[facility] Patient Name: Alexis [name] Procedure Date: 01/20/2017 7:32 AM MRN: [id number] Endoscopist: Docia Chuck. Henrene Pastor , MD Age: 66 Referring MD:  Date of Birth: 1950/08/31 Gender: Female Account #: [id number] Procedure:                Upper GI endoscopy, with Venia Minks dilation of the                            esophagus Indications:              Dysphagia Medicines:                Monitored Anesthesia Care Procedure:                Pre-Anesthesia Assessment:                           - Prior to the procedure, a History and Physical                            was performed, and patient medications and                            allergies were reviewed. The patient's tolerance of                            previous anesthesia was also reviewed. The risks                            and benefits of the procedure and the sedation                            options and risks were discussed with the patient.                            All questions were answered, and informed consent                            was obtained. Prior Anticoagulants: The patient has                            taken no previous anticoagulant or antiplatelet                            agents. ASA Grade Assessment: II - A patient with                            mild systemic disease. After reviewing the risks                            and benefits, the patient was deemed in                            satisfactory condition to undergo the procedure.  After obtaining informed consent, the endoscope was                            passed under direct vision. Throughout the                            procedure, the patient's blood pressure, pulse, and                            oxygen saturations were monitored continuously. The                            Endoscope was introduced through the mouth, and                            advanced to the second part of duodenum. The upper                      GI endoscopy was accomplished without difficulty.                            The patient tolerated the procedure well. Scope In: Scope Out: Findings:                 One mild benign-appearing, intrinsic stenosis was                            found 35 cm from the incisors. This measured 1.5 cm                            (inner diameter). The scope was withdrawn. Dilation                            was performed with a Maloney dilator with no                            resistance at 40 Fr. No heme. Tolerated well.                           The exam of the esophagus was otherwise normal.                           The stomach was normal, save small benign fundic                            gland type polyps.                           The examined duodenum was normal.                           The cardia and gastric fundus were normal on                            retroflexion. Complications:  No immediate complications. Estimated Blood Loss:     Estimated blood loss: none. Impression:               - Benign-appearing esophageal stenosis. Dilated.                           - Otherwise unremarkable EGD. Recommendation:           - Patient has a contact number available for                            emergencies. The signs and symptoms of potential                            delayed complications were discussed with the                            patient. Return to normal activities tomorrow.                            Written discharge instructions were provided to the                            patient.                           - Post dilation diet.                           - Continue present medications.                           - Return to the care of your primary provider. GI                            follow-up as needed Docia Chuck. Henrene Pastor, MD 01/20/2017 8:10:54 AM This report has been signed electronically.

## 2017-01-20 NOTE — Op Note (Signed)
[facility] Patient Name: Alexis [name] Procedure Date: 01/20/2017 7:32 AM MRN: [id number] Endoscopist: Docia Chuck. Henrene Pastor , MD Age: 66 Referring MD:  Date of Birth: [birth year] Gender: Female Account #: [id number] Procedure:                Colonoscopy, with cold snare polypectomy x 2 Indications:              High risk colon cancer surveillance: Personal                            history of adenoma (10 mm or greater in size), High                            risk colon cancer surveillance: Personal history of                            multiple (3 or more) adenomas. Previous                            examinations 2009 and 2013 Medicines:                Monitored Anesthesia Care Procedure:                Pre-Anesthesia Assessment:                           - Prior to the procedure, a History and Physical                            was performed, and patient medications and                            allergies were reviewed. The patient's tolerance of                            previous anesthesia was also reviewed. The risks                            and benefits of the procedure and the sedation                            options and risks were discussed with the patient.                            All questions were answered, and informed consent                            was obtained. Prior Anticoagulants: The patient has                            taken no previous anticoagulant or antiplatelet                            agents. ASA Grade Assessment: II - A patient with  mild systemic disease. After reviewing the risks                            and benefits, the patient was deemed in                            satisfactory condition to undergo the procedure.                           After obtaining informed consent, the colonoscope                            was passed under direct vision. Throughout the                            procedure, the  patient's blood pressure, pulse, and                            oxygen saturations were monitored continuously. The                            Colonoscope was introduced through the anus and                            advanced to the the cecum, identified by                            appendiceal orifice and ileocecal valve. The                            ileocecal valve, appendiceal orifice, and rectum                            were photographed. The quality of the bowel                            preparation was excellent. The colonoscopy was                            performed without difficulty. The patient tolerated                            the procedure well. The bowel preparation used was                            SUPREP. Scope In: 7:39:47 AM Scope Out: 6:50:35 AM Scope Withdrawal Time: 0 hours 14 minutes 28 seconds  Total Procedure Duration: 0 hours 17 minutes 32 seconds  Findings:                 Two polyps were found in the sigmoid colon and                            ascending colon. The polyps were 3 to 5 mm in size.  These polyps were removed with a cold snare.                            Resection and retrieval were complete.                           Multiple diverticula were found in the entire colon.                           Internal hemorrhoids were found during retroflexion. Complications:            No immediate complications. Estimated blood loss:                            None. Estimated Blood Loss:     Estimated blood loss: none. Impression:               - Two 3 to 5 mm polyps in the sigmoid colon and in                            the ascending colon, removed with a cold snare.                            Resected and retrieved.                           - Diverticulosis in the entire examined colon.                           - Internal hemorrhoids. Recommendation:           - Repeat colonoscopy in 5 years for surveillance.                            - Patient has a contact number available for                            emergencies. The signs and symptoms of potential                            delayed complications were discussed with the                            patient. Return to normal activities tomorrow.                            Written discharge instructions were provided to the                            patient.                           - Resume previous diet.                           - Continue present medications.                           -  Await pathology results. Docia Chuck. Henrene Pastor, MD 01/20/2017 8:07:49 AM This report has been signed electronically.

## 2017-01-22 ENCOUNTER — Other Ambulatory Visit: Payer: Self-pay | Admitting: Family Medicine

## 2017-01-22 DIAGNOSIS — E1149 Type 2 diabetes mellitus with other diabetic neurological complication: Secondary | ICD-10-CM

## 2017-01-23 ENCOUNTER — Telehealth: Payer: Self-pay | Admitting: *Deleted

## 2017-01-23 NOTE — Telephone Encounter (Signed)
  Follow up Call-  Call back number 01/20/2017  Post procedure Call Back phone  # [phone]- no voice mail   Permission to leave phone message No  Some recent data might be hidden     Patient questions:  Do you have a fever, pain , or abdominal swelling? No. Pain Score  0 *  Have you tolerated food without any problems? Yes.    Have you been able to return to your normal activities? Yes.    Do you have any questions about your discharge instructions: Diet   No. Medications  No. Follow up visit  No.  Do you have questions or concerns about your Care? No.  Actions: * If pain score is 4 or above: No action needed, pain <4.

## 2017-01-24 ENCOUNTER — Encounter: Payer: Self-pay | Admitting: Internal Medicine

## 2017-01-24 ENCOUNTER — Ambulatory Visit (INDEPENDENT_AMBULATORY_CARE_PROVIDER_SITE_OTHER): Payer: Medicare HMO

## 2017-01-24 VITALS — BP 124/78 | HR 72 | Temp 98.4°F | Ht 62.5 in | Wt 217.8 lb

## 2017-01-24 DIAGNOSIS — Z0000 Encounter for general adult medical examination without abnormal findings: Secondary | ICD-10-CM

## 2017-01-24 DIAGNOSIS — E1149 Type 2 diabetes mellitus with other diabetic neurological complication: Secondary | ICD-10-CM | POA: Diagnosis not present

## 2017-01-24 LAB — BASIC METABOLIC PANEL
BUN: 12 mg/dL (ref 6–23)
CALCIUM: 10.1 mg/dL (ref 8.4–10.5)
CO2: 31 meq/L (ref 19–32)
Chloride: 100 mEq/L (ref 96–112)
Creatinine, Ser: 0.74 mg/dL (ref 0.40–1.20)
GFR: 83.3 mL/min (ref >60.00)
GLUCOSE: 190 mg/dL — AB (ref 70–99)
Potassium: 4.9 mEq/L (ref 3.5–5.1)
SODIUM: 137 meq/L (ref 135–145)

## 2017-01-24 LAB — HEMOGLOBIN A1C: HEMOGLOBIN A1C: 9.8 % — AB (ref 4.6–6.5)

## 2017-01-24 LAB — LIPID PANEL
CHOLESTEROL: 173 mg/dL (ref 0–200)
HDL: 28.9 mg/dL — AB (ref >39.00)
NonHDL: 144.36
TRIGLYCERIDES: 274 mg/dL — AB (ref 0.0–149.0)
Total CHOL/HDL Ratio: 6
VLDL: 54.8 mg/dL — ABNORMAL HIGH (ref 0.0–40.0)

## 2017-01-24 LAB — LDL CHOLESTEROL, DIRECT: Direct LDL: 111 mg/dL

## 2017-01-24 NOTE — Patient Instructions (Addendum)
Alexis [name] , Thank you for taking time to come for your Medicare Wellness Visit. I appreciate your ongoing commitment to your health goals. Please review the following plan we discussed and let me know if I can assist you in the future.   These are the goals we discussed: Goals    . Increase physical activity          Starting 01/24/2017, I will attempt to walk at 15-30 min 5 days per week.        This is a list of the screening recommended for you and due dates:  Health Maintenance  Topic Date Due  . Flu Shot  07/16/2017*  . Eye exam for diabetics  01/24/2018*  . Tetanus Vaccine  03/03/2017  . Hemoglobin A1C  03/31/2017  . Complete foot exam   09/29/2017  . Mammogram  10/18/2018  . Colon Cancer Screening  01/21/2020  . DEXA scan (bone density measurement)  Completed  .  Hepatitis C: One time screening is recommended by Center for Disease Control  (CDC) for  adults born from [birth year] through 1965.   Completed  . Pneumonia vaccines  Completed  *Topic was postponed. The date shown is not the original due date.     Preventive Care for Adults  A healthy lifestyle and preventive care can promote health and wellness. Preventive health guidelines for adults include the following key practices.  . A routine yearly physical is a good way to check with your health care provider about your health and preventive screening. It is a chance to share any concerns and updates on your health and to receive a thorough exam.  . Visit your dentist for a routine exam and preventive care every 6 months. Brush your teeth twice a day and floss once a day. Good oral hygiene prevents tooth decay and gum disease.  . The frequency of eye exams is based on your age, health, family medical history, use  of contact lenses, and other factors. Follow your health care provider's ecommendations for frequency of eye exams.  . Eat a healthy diet. Foods like vegetables, fruits, whole grains, low-fat dairy products,  and lean protein foods contain the nutrients you need without too many calories. Decrease your intake of foods high in solid fats, added sugars, and salt. Eat the right amount of calories for you. Get information about a proper diet from your health care provider, if necessary.  . Regular physical exercise is one of the most important things you can do for your health. Most adults should get at least 150 minutes of moderate-intensity exercise (any activity that increases your heart rate and causes you to sweat) each week. In addition, most adults need muscle-strengthening exercises on 2 or more days a week.  Silver Sneakers may be a benefit available to you. To determine eligibility, you may visit the website: www.silversneakers.com or contact program at (713)812-0182 Mon-Fri between 8AM-8PM.   . Maintain a healthy weight. The body mass index (BMI) is a screening tool to identify possible weight problems. It provides an estimate of body fat based on height and weight. Your health care provider can find your BMI and can help you achieve or maintain a healthy weight.   For adults 20 years and older: ? A BMI below 18.5 is considered underweight. ? A BMI of 18.5 to 24.9 is normal. ? A BMI of 25 to 29.9 is considered overweight. ? A BMI of 30 and above is considered obese.   . Maintain  normal blood lipids and cholesterol levels by exercising and minimizing your intake of saturated fat. Eat a balanced diet with plenty of fruit and vegetables. Blood tests for lipids and cholesterol should begin at age 86 and be repeated every 5 years. If your lipid or cholesterol levels are high, you are over 50, or you are at high risk for heart disease, you may need your cholesterol levels checked more frequently. Ongoing high lipid and cholesterol levels should be treated with medicines if diet and exercise are not working.  . If you smoke, find out from your health care provider how to quit. If you do not use tobacco,  please do not start.  . If you choose to drink alcohol, please do not consume more than 2 drinks per day. One drink is considered to be 12 ounces (355 mL) of beer, 5 ounces (148 mL) of wine, or 1.5 ounces (44 mL) of liquor.  . If you are 22-52 years old, ask your health care provider if you should take aspirin to prevent strokes.  . Use sunscreen. Apply sunscreen liberally and repeatedly throughout the day. You should seek shade when your shadow is shorter than you. Protect yourself by wearing long sleeves, pants, a wide-brimmed hat, and sunglasses year round, whenever you are outdoors.  . Once a month, do a whole body skin exam, using a mirror to look at the skin on your back. Tell your health care provider of new moles, moles that have irregular borders, moles that are larger than a pencil eraser, or moles that have changed in shape or color.

## 2017-01-24 NOTE — Progress Notes (Signed)
Pre visit review using our clinic review tool, if applicable. No additional management support is needed unless otherwise documented below in the visit note. 

## 2017-01-24 NOTE — Progress Notes (Signed)
Subjective:   Alexis [name] is a 66 y.o. female who presents for an Initial Medicare Annual Wellness Visit.  Review of Systems    N/A  Cardiac Risk Factors include: advanced age (>83men, >4 women);obesity (BMI >30kg/m2);diabetes mellitus;dyslipidemia;hypertension     Objective:    Today's Vitals   01/24/17 1207  BP: 124/78  Pulse: 72  Temp: 98.4 F (36.9 C)  TempSrc: Oral  SpO2: 96%  Weight: 217 lb 12 oz (98.8 kg)  Height: 5' 2.5" (1.588 m)  PainSc: 0-No pain   Body mass index is 39.19 kg/m.   Current Medications (verified) Outpatient Encounter Prescriptions as of 01/24/2017  Medication Sig  . ACCU-CHEK AVIVA PLUS test strip TEST BLOOD SUGAR TWICE DAILY  AND AS NEEDED AS DIRECTED  . ACCU-CHEK FASTCLIX LANCETS MISC Use as directed to check blood sugar twice daily and as needed.  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  Marland Kitchen aspirin EC 81 MG tablet Take 81 mg by mouth daily.  . enalapril (VASOTEC) 20 MG tablet TAKE 1 TABLET EVERY DAY  . Insulin Syringe-Needle U-100 (RELION INSULIN SYR 0.5CC/30G) 30G X 5/16" 0.5 ML MISC Use to inject insulin twice daily as directed.  Diagnosis: E11.9.  Insulin dependent. (Patient taking differently: Use to inject insulin twice daily as directed.  Diagnosis: E11.9.  Insulin dependent.)  . metFORMIN (GLUCOPHAGE) 1000 MG tablet TAKE 1 TABLET TWICE DAILY WITH A MEAL  . omeprazole (PRILOSEC) 20 MG capsule Take 20 mg by mouth daily.  . propranolol (INDERAL) 10 MG tablet TAKE 1 TABLET TWICE DAILY  . loratadine (CLARITIN) 10 MG tablet Take 1 tablet (10 mg total) by mouth daily. (Patient not taking: Reported on 10/31/2016)   No facility-administered encounter medications on file as of 01/24/2017.     Allergies (verified) Patient has no known allergies.   History: Past Medical History:  Diagnosis Date  . Allergy   . Asthma    in childhood  . Benign tumor of pelvic bones, sacrum, and coccyx   . Blood in stool   . Diabetes mellitus    dx'd at  age ~44  . GERD (gastroesophageal reflux disease)   . Hx of colonic polyps   . Hyperlipidemia   . Hypertension    dx'd at age ~76  . Sleep apnea    on CPAP  . Tremor    only in face; worse under periods of stress. father with tremor also   Past Surgical History:  Procedure Laterality Date  . [place]   Surgery included removal of benign pelvic tumor  . COLONOSCOPY  01/20/2017  . LAPAROSCOPIC APPENDECTOMY N/A 10/30/2015   Procedure: APPENDECTOMY LAPAROSCOPIC, lysis of adhesions;  Surgeon: Hubbard Robinson, MD;  Location: [facility] ORS;  Service: General;  Laterality: N/A;  . OVARY BIOPSY  1988  . pelvic bone tumor  1988  . TONSILLECTOMY AND ADENOIDECTOMY  1957  . UPPER GI ENDOSCOPY  01/20/2017   Family History  Problem Relation Age of Onset  . Heart disease Mother        h/o valve replacement, no h/o MI  . Colon cancer Father   . Colon cancer Sister        metastatic (liver?) cancer  . Uterine cancer Sister   . Lung cancer Maternal Grandfather    Social History   Occupational History  . [employer]roll [employer]   Social History Main Topics  . Smoking status: Never Smoker  . Smokeless tobacco: Never Used  . Alcohol  use No  . Drug use: No  . Sexual activity: Not on file    Tobacco Counseling Counseling given: No   Activities of Daily Living In your present state of health, do you have any difficulty performing the following activities: 01/24/2017  Hearing? N  Vision? N  Difficulty concentrating or making decisions? N  Walking or climbing stairs? N  Dressing or bathing? N  Doing errands, shopping? N  Preparing Food and eating ? N  Using the Toilet? N  In the past six months, have you accidently leaked urine? N  Do you have problems with loss of bowel control? N  Managing your Medications? N  Managing your Finances? N  Housekeeping or managing your Housekeeping? N  Some recent data might be hidden    Immunizations and Health  Maintenance Immunization History  Administered Date(s) Administered  . Influenza Split 02/15/2011  . Influenza Whole 02/18/2013  . Influenza,inj,Quad PF,6+ Mos 05/29/2015, 02/02/2016  . Pneumococcal Conjugate-13 11/13/2013  . Pneumococcal Polysaccharide-23 04/18/2008, 08/31/2015   There are no preventive care reminders to display for this patient.  Patient Care Team: [name], MD as PCP - General (Family Medicine)  Indicate any recent Medical Services you may have received from other than [facility] providers in the past year (date may be approximate).     Assessment:   This is a routine wellness examination for [name].   Hearing/Vision screen  Hearing Screening   125Hz  250Hz  500Hz  1000Hz  2000Hz  3000Hz  4000Hz  6000Hz  8000Hz   Right ear:   40 40 40  40    Left ear:   40 40 40  40      Visual Acuity Screening   Right eye Left eye Both eyes  Without correction:     With correction: 20/40 20/50 20/30     Dietary issues and exercise activities discussed: Current Exercise Habits: The patient does not participate in regular exercise at present, Exercise limited by: None identified  Goals    . Increase physical activity          Starting 01/24/2017, I will attempt to walk at 15-30 min 5 days per week.       Depression Screen PHQ 2/9 Scores 01/24/2017  PHQ - 2 Score 0  PHQ- 9 Score 0    Fall Risk Fall Risk  01/24/2017  Falls in the past year? No    Cognitive Function: MMSE - Mini Mental State Exam 01/24/2017  Orientation to time 5  Orientation to Place 5  Registration 3  Attention/ Calculation 0  Recall 3  Language- name 2 objects 0  Language- repeat 1  Language- follow 3 step command 3  Language- read & follow direction 0  Write a sentence 0  Copy design 0  Total score 20       PLEASE NOTE: A Mini-Cog screen was completed. Maximum score is 20. A value of 0 denotes this part of Folstein MMSE was not completed or the patient failed this part of the Mini-Cog  screening.   Mini-Cog Screening Orientation to Time - Max 5 pts Orientation to Place - Max 5 pts Registration - Max 3 pts Recall - Max 3 pts Language Repeat - Max 1 pts Language Follow 3 Step Command - Max 3 pts   Screening Tests Health Maintenance  Topic Date Due  . INFLUENZA VACCINE  07/16/2017 (Originally 11/16/2016)  . OPHTHALMOLOGY EXAM  01/24/2018 (Originally 05/15/2013)  . TETANUS/TDAP  03/03/2017  . HEMOGLOBIN A1C  03/31/2017  . FOOT EXAM  09/29/2017  . MAMMOGRAM  10/18/2018  . COLONOSCOPY  01/21/2020  . DEXA SCAN  Completed  . Hepatitis C Screening  Completed  . PNA vac Low Risk Adult  Completed      Plan:     I have personally reviewed and addressed the Medicare Annual Wellness questionnaire and have noted the following in the patient's chart:  A. Medical and social history B. Use of alcohol, tobacco or illicit drugs  C. Current medications and supplements D. Functional ability and status E.  Nutritional status F.  Physical activity G. Advance directives H. List of other physicians I.  Hospitalizations, surgeries, and ER visits in previous 12 months J.  [place] to include hearing, vision, cognitive, depression L. Referrals and appointments - none  In addition, I have reviewed and discussed with patient certain preventive protocols, quality metrics, and best practice recommendations. A written personalized care plan for preventive services as well as general preventive health recommendations were provided to patient.  See attached scanned questionnaire for additional information.   Signed,   Lindell Noe, MHA, BS, LPN Health Coach

## 2017-01-24 NOTE — Progress Notes (Signed)
PCP notes:   Health maintenance:  Flu vaccine - PCP please administer at CPE if pt is feeling better Eye exam - addressed; pt plans to schedule appt  Abnormal screenings:   None  Patient concerns:   Nosebleeds - pt reports she is having 2-3 nosebleeds per week  Nurse concerns:  None  Next PCP appt:   01/27/17 @ 0945  I reviewed health advisor's note, was available for consultation on the day of service listed in this note, and agree with documentation and plan. Elsie Stain, MD.

## 2017-01-27 ENCOUNTER — Ambulatory Visit (INDEPENDENT_AMBULATORY_CARE_PROVIDER_SITE_OTHER): Payer: Medicare HMO | Admitting: Family Medicine

## 2017-01-27 ENCOUNTER — Encounter: Payer: Self-pay | Admitting: Family Medicine

## 2017-01-27 VITALS — BP 124/78 | HR 72 | Temp 98.4°F | Ht 62.5 in | Wt 217.8 lb

## 2017-01-27 DIAGNOSIS — Z23 Encounter for immunization: Secondary | ICD-10-CM

## 2017-01-27 DIAGNOSIS — Z0000 Encounter for general adult medical examination without abnormal findings: Secondary | ICD-10-CM

## 2017-01-27 DIAGNOSIS — R040 Epistaxis: Secondary | ICD-10-CM

## 2017-01-27 DIAGNOSIS — E1149 Type 2 diabetes mellitus with other diabetic neurological complication: Secondary | ICD-10-CM | POA: Diagnosis not present

## 2017-01-27 DIAGNOSIS — R251 Tremor, unspecified: Secondary | ICD-10-CM | POA: Diagnosis not present

## 2017-01-27 DIAGNOSIS — Z780 Asymptomatic menopausal state: Secondary | ICD-10-CM

## 2017-01-27 NOTE — Patient Instructions (Addendum)
Check with your insurance to see if they will cover the shingrix and tetanus shots. Rosaria Ferries will call about your referral for the bone density test.   Recheck your sugar in about 3 months, prior to a visit.  Take care.  Glad to see you.  Update me as needed.

## 2017-01-27 NOTE — Progress Notes (Signed)
Diabetes:  Using medications without difficulties:yes Hypoglycemic episodes:no Hyperglycemic episodes:no Feet problems:some tingling in the feet, variable.   Blood Sugars averaging: 160-180, variable.   eye exam within last year: addressed; pt plans to schedule appt a1c 1 point better but not to goal, d/w pt.   She is going to get back to walking and that should help her sugar.  She has had a lot of upheaval this year, d/w pt.   Statin intolerant.    Tremor at baseline.  Still able to play piano at church.  She is able to tolerate her symptoms as is. No adverse effect on medication.  We can refer in the future if needed. Discussed with patient.  Nosebleeds - pt reports she is having 1-2 nosebleeds per week. She pinches and packs the area and they usually stop.  Had been going on for years, episodically.  D/w pt about ENT referral for cautery- we can refer if needed.  She'll update me.    Flu vaccine- done today.  Colonoscopy done 2018.    EGD 2018.   Mammogram 2018 DXA d/w pt.  Prev normal, years ago.  Due for f/u.   Pap not due.    PMH and SH reviewed  Meds, vitals, and allergies reviewed.   ROS: Per HPI unless specifically indicated in ROS section   GEN: nad, alert and oriented HEENT: mucous membranes moist, OP wnl, nasal exam wnl NECK: supple w/o LA CV: rrr. PULM: ctab, no inc wob ABD: soft, +bs EXT: no edema SKIN: no acute rash Hand and head tremor noted at baseline.  Diabetic foot exam: Normal inspection No skin breakdown No calluses  Normal DP pulses Normal sensation to light touch and monofilament Nails normal

## 2017-01-29 DIAGNOSIS — Z0000 Encounter for general adult medical examination without abnormal findings: Secondary | ICD-10-CM | POA: Insufficient documentation

## 2017-01-29 DIAGNOSIS — R040 Epistaxis: Secondary | ICD-10-CM | POA: Insufficient documentation

## 2017-01-29 NOTE — Assessment & Plan Note (Signed)
Discussed with patient about symptomatic treatment. We can refer to ENT if needed for cautery. She has no symptoms now.

## 2017-01-29 NOTE — Assessment & Plan Note (Signed)
Still able to play piano at church.  She is able to tolerate her symptoms as is. No adverse effect on medication.  We can refer in the future if needed. Discussed with patient.

## 2017-01-29 NOTE — Assessment & Plan Note (Signed)
Her a1c is 1 point better but not to goal, d/w pt.   She is going to get back to walking and that should help her sugar.  She has had a lot of upheaval this year, d/w pt.   Statin intolerant.   Continue as is with insulin, continue work on diet and exercise. She agrees. Recheck in about 3 months. >25 minutes spent in face to face time with patient, >50% spent in counselling or coordination of care.

## 2017-01-29 NOTE — Assessment & Plan Note (Signed)
Flu vaccine- done today.  Colonoscopy done 2018.    EGD 2018.   Mammogram 2018 DXA d/w pt.  Prev normal, years ago.   Pap not due.

## 2017-02-27 ENCOUNTER — Encounter: Payer: Self-pay | Admitting: Family Medicine

## 2017-03-06 DIAGNOSIS — Z780 Asymptomatic menopausal state: Secondary | ICD-10-CM | POA: Diagnosis not present

## 2017-03-06 DIAGNOSIS — Z13820 Encounter for screening for osteoporosis: Secondary | ICD-10-CM | POA: Diagnosis not present

## 2017-03-06 LAB — HM DEXA SCAN: HM DEXA SCAN: NORMAL

## 2017-03-21 ENCOUNTER — Encounter: Payer: Self-pay | Admitting: Family Medicine

## 2017-04-19 ENCOUNTER — Other Ambulatory Visit: Payer: Self-pay | Admitting: Family Medicine

## 2017-04-19 DIAGNOSIS — E1149 Type 2 diabetes mellitus with other diabetic neurological complication: Secondary | ICD-10-CM

## 2017-04-26 ENCOUNTER — Other Ambulatory Visit (INDEPENDENT_AMBULATORY_CARE_PROVIDER_SITE_OTHER): Payer: Medicare HMO

## 2017-04-26 DIAGNOSIS — E1149 Type 2 diabetes mellitus with other diabetic neurological complication: Secondary | ICD-10-CM | POA: Diagnosis not present

## 2017-04-26 LAB — HEMOGLOBIN A1C: HEMOGLOBIN A1C: 11.7 % — AB (ref 4.6–6.5)

## 2017-05-01 ENCOUNTER — Encounter: Payer: Self-pay | Admitting: Family Medicine

## 2017-05-01 ENCOUNTER — Ambulatory Visit (INDEPENDENT_AMBULATORY_CARE_PROVIDER_SITE_OTHER): Payer: Medicare HMO | Admitting: Family Medicine

## 2017-05-01 DIAGNOSIS — E1149 Type 2 diabetes mellitus with other diabetic neurological complication: Secondary | ICD-10-CM

## 2017-05-01 MED ORDER — INSULIN NPH ISOPHANE & REGULAR (70-30) 100 UNIT/ML ~~LOC~~ SUSP: SUBCUTANEOUS | Status: DC

## 2017-05-01 MED ORDER — "INSULIN SYRINGE-NEEDLE U-100 30G X 5/16"" 0.5 ML MISC": 12 refills | Status: DC

## 2017-05-01 NOTE — Patient Instructions (Addendum)
Call the [facility] dental school and see if they can help.  [phone].    Get back to walking, add 1 unit per day in the AM and PM until your sugars and less than 150.   Recheck labs in about 3 months, labs ahead of time.   Update me if not any better in the meantime.   Take care.  Glad to see you.

## 2017-05-01 NOTE — Progress Notes (Signed)
Diabetes:  Using medications without difficulties: yes Hypoglycemic episodes:no Hyperglycemic episodes:yes Feet problems: some tingling at night.   Blood Sugars averaging: lowest ~190 eye exam within last year: due, d/w pt.   A1c d/w pt.   Not exercising as much as prev, d/w pt about options.  She needs a regular routine with exercise.    She has more tooth pain, cracking teeth.  That is affecting her diet and that likely affects her sugar.  D/w pt.  See AVS.    PMH and SH reviewed  Meds, vitals, and allergies reviewed.   ROS: Per HPI unless specifically indicated in ROS section   GEN: nad, alert and oriented HEENT: mucous membranes moist NECK: supple w/o LA CV: rrr. PULM: ctab, no inc wob ABD: soft, +bs EXT: trace BLE edema

## 2017-05-02 NOTE — Assessment & Plan Note (Signed)
D/w pt about options.  She'll call the [facility] dental school and see if they can help.  She'll get back to walking, add 1 unit per day in the AM and PM with her insulin until sugars are less than 150.   Recheck labs in about 3 months, labs ahead of time.   Update me if not any better in the meantime.   She agrees.

## 2017-07-21 ENCOUNTER — Telehealth: Payer: Self-pay | Admitting: Family Medicine

## 2017-07-21 ENCOUNTER — Other Ambulatory Visit: Payer: Self-pay | Admitting: *Deleted

## 2017-07-21 MED ORDER — PROPRANOLOL HCL 10 MG PO TABS: 10.0000 mg | ORAL_TABLET | Freq: Two times a day (BID) | ORAL | 0 refills | Status: DC

## 2017-07-21 MED ORDER — ENALAPRIL MALEATE 20 MG PO TABS: 20.0000 mg | ORAL_TABLET | Freq: Every day | ORAL | 0 refills | Status: DC

## 2017-07-21 MED ORDER — METFORMIN HCL 500 MG PO TABS: 500.0000 mg | ORAL_TABLET | Freq: Two times a day (BID) | ORAL | 0 refills | Status: DC

## 2017-07-21 NOTE — Telephone Encounter (Signed)
Copied from [place] [phone] [phone]. Topic: Quick Communication - Rx Refill/Question >> Jul 21, 2017  2:05 PM Robina Ade, Helene Kelp D wrote: Medication: metFORMIN (GLUCOPHAGE) 1000 MG tablet, propranolol (INDERAL) 10 MG tablet,  enalapril (VASOTEC) 20 MG tablet  Has the patient contacted their pharmacy? Yes patient would like a 30 day supply from this pharmacy. (Agent: If no, request that the patient contact the pharmacy for the refill.) Preferred Pharmacy (with phone number or street name):   [place], [place]      Agent: Please be advised that RX refills may take up to 3 business days. We ask that you follow-up with your pharmacy.

## 2017-07-21 NOTE — Telephone Encounter (Signed)
One month supply of medication sent to local pharmacy. Patient has appointment 08/01/17. LOV: 05/01/17

## 2017-07-23 ENCOUNTER — Other Ambulatory Visit: Payer: Self-pay | Admitting: Family Medicine

## 2017-07-23 DIAGNOSIS — E1149 Type 2 diabetes mellitus with other diabetic neurological complication: Secondary | ICD-10-CM

## 2017-07-27 ENCOUNTER — Other Ambulatory Visit (INDEPENDENT_AMBULATORY_CARE_PROVIDER_SITE_OTHER): Payer: Medicare HMO

## 2017-07-27 DIAGNOSIS — E1149 Type 2 diabetes mellitus with other diabetic neurological complication: Secondary | ICD-10-CM | POA: Diagnosis not present

## 2017-07-27 LAB — HEMOGLOBIN A1C: HEMOGLOBIN A1C: 9.3 % — AB (ref 4.6–6.5)

## 2017-08-01 ENCOUNTER — Encounter: Payer: Self-pay | Admitting: Family Medicine

## 2017-08-01 ENCOUNTER — Other Ambulatory Visit: Payer: Self-pay | Admitting: *Deleted

## 2017-08-01 ENCOUNTER — Ambulatory Visit (INDEPENDENT_AMBULATORY_CARE_PROVIDER_SITE_OTHER): Payer: Medicare HMO | Admitting: Family Medicine

## 2017-08-01 VITALS — BP 122/82 | HR 90 | Temp 98.7°F | Wt 227.5 lb

## 2017-08-01 DIAGNOSIS — E1149 Type 2 diabetes mellitus with other diabetic neurological complication: Secondary | ICD-10-CM

## 2017-08-01 DIAGNOSIS — R079 Chest pain, unspecified: Secondary | ICD-10-CM

## 2017-08-01 MED ORDER — PROPRANOLOL HCL 10 MG PO TABS: 10.0000 mg | ORAL_TABLET | Freq: Two times a day (BID) | ORAL | 2 refills | Status: DC

## 2017-08-01 MED ORDER — ENALAPRIL MALEATE 20 MG PO TABS: 20.0000 mg | ORAL_TABLET | Freq: Every day | ORAL | 2 refills | Status: DC

## 2017-08-01 MED ORDER — ACCU-CHEK SOFTCLIX LANCETS MISC: 2 refills | Status: DC

## 2017-08-01 MED ORDER — METFORMIN HCL 1000 MG PO TABS: ORAL_TABLET | ORAL | 2 refills | Status: DC

## 2017-08-01 MED ORDER — "INSULIN SYRINGE-NEEDLE U-100 30G X 5/16"" 0.5 ML MISC": 2 refills | Status: DC

## 2017-08-01 NOTE — Progress Notes (Signed)
Diabetes:  Using medications without difficulties: yes Hypoglycemic episodes: one episode down to 78 but that was isolated event.  She usually has midmorning snack.  D/w pt about cautions.   Hyperglycemic episodes: no Feet problems: some tingling and pain, not changed. tolerable Blood Sugars averaging: ~120s recently  eye exam within last year: due, d/w pt.  A1c improved, d/w pt.  She is working on her diet.    Prev statin intolerant.    She has chest tightness with yard work, resolved with rest.  No CP.  Not SOB not but does have SOB with exertion.  Sx started in the last month.  She doesn't have sx with walking, only with pushing the mower.    She isn't fasting this AM.    She had recently URI with some residual cough but she is getting better overall from that.    PMH and SH reviewed  Meds, vitals, and allergies reviewed.   ROS: Per HPI unless specifically indicated in ROS section   GEN: nad, alert and oriented HEENT: mucous membranes moist NECK: supple w/o LA CV: rrr. PULM: ctab, no inc wob ABD: soft, +bs EXT: no edema SKIN: no acute rash  EKG reviewed, no acute changes.  D/w pt at OV.

## 2017-08-01 NOTE — Patient Instructions (Addendum)
We will call about your referral.  Rosaria Ferries or Azalee Course will call you if you don't see one of them on the way out.  Limit exertion in the meantime. If you have continued chest pain then go to the ER.  Plan on recheck in 4 month with labs ahead of time.  Take care.  Glad to see you.

## 2017-08-02 DIAGNOSIS — R079 Chest pain, unspecified: Secondary | ICD-10-CM | POA: Insufficient documentation

## 2017-08-02 NOTE — Assessment & Plan Note (Signed)
The concern is for angina in this patient with long-standing uncontrolled diabetes.  Discussed with patient about her EKG.  No acute changes.  No chest pain now.  Relative rest in the meantime.  Refer to cardiology.  She is not fasting today and she has been statin intolerant in the past so I did not recheck her lipids today.  Routine emergency room cautions given.  See after visit summary.  She agrees with referral to cardiology and I appreciate the help of all involved. >25 minutes spent in face to face time with patient, >50% spent in counselling or coordination of care.

## 2017-08-02 NOTE — Assessment & Plan Note (Signed)
A1c improved, d/w pt. Not at goal but better.   She is working on her diet.    Prev statin intolerant.   Her A1c may lag her progress given her recent sugars.   Continue meds as is, continue work on diet.  Plan on recheck in 4 month with labs ahead of time.  She agrees.

## 2017-08-02 NOTE — Progress Notes (Signed)
Cardiology Office Note  Date:  08/04/2017   ID:  PORTER NAKAMA, DOB May 22, 1950, MRN [id number]  PCP:  [name], MD   Chief Complaint  Patient presents with  . OTHER    Chest tightness with exertion. Meds reviewed verbally with pt.    HPI:  Ms Clorissa Gruenberg  HTN hyperlipidemia Diabetes 2 on insulin, HBA1C 9.3, was 11.7 Presenting by referral from dr. 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  PSH:    Past Surgical History:  Procedure Laterality Date  . [place]   Surgery included removal of benign pelvic tumor  . COLONOSCOPY  01/20/2017  . LAPAROSCOPIC APPENDECTOMY N/A 10/30/2015   Procedure: APPENDECTOMY LAPAROSCOPIC, lysis of adhesions;  Surgeon: Hubbard Robinson, MD;  Location: [facility] ORS;  Service: General;  Laterality: N/A;  . OVARY BIOPSY  1988  . pelvic bone tumor  1988  . TONSILLECTOMY AND ADENOIDECTOMY  1957  .  UPPER GI ENDOSCOPY  01/20/2017    Current Outpatient Medications  Medication Sig Dispense Refill  . ACCU-CHEK AVIVA PLUS test strip TEST BLOOD SUGAR TWICE DAILY  AND AS NEEDED AS DIRECTED 300 each 3  . ACCU-CHEK SOFTCLIX LANCETS lancets Use as instructed to checkk blood sugar twice daily and as needed.  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 200 each 2  . aspirin EC 81 MG tablet Take 81 mg by mouth daily.    . enalapril (VASOTEC) 20 MG tablet Take 1 tablet (20 mg total) by mouth daily. 90 tablet 2  . insulin NPH-regular Human (NOVOLIN 70/30) (70-30) 100 UNIT/ML injection 36 units in AM and 26 units in PM (Patient taking differently: 46 units in AM and 36 units in PM) 10 mL   . Insulin Syringe-Needle U-100 (RELION INSULIN SYR 0.5CC/30G) 30G X 5/16" 0.5 ML MISC Use to inject insulin twice daily as directed.  Diagnosis: E11.9.  Insulin dependent. 200 each 2  . loratadine (CLARITIN) 10 MG tablet Take 1 tablet (10 mg total) by mouth daily.    . metFORMIN (GLUCOPHAGE) 1000 MG tablet TAKE 1 TABLET TWICE DAILY WITH A MEAL 180 tablet 2  . omeprazole (PRILOSEC) 20 MG capsule Take 20 mg by mouth daily.    . propranolol (INDERAL) 10 MG tablet Take 1 tablet (10 mg total) by mouth 2 (two) times daily. 180 tablet 2  . rosuvastatin (CRESTOR) 10 MG tablet Take 1 tablet (10 mg total) by mouth daily. 30 tablet 6  No current facility-administered medications for this visit.      Allergies:   Statins   Social History:  The patient  reports that she has never smoked. She has never used smokeless tobacco. She reports that she does not drink alcohol or use drugs.   Family History:   family history includes Colon cancer in her father and sister; Heart disease in her mother; Lung cancer in her maternal grandfather; Uterine cancer in her sister.    Review of Systems: Review of Systems  Constitutional: Negative.   Respiratory: Positive for shortness of breath.   Cardiovascular: Positive for chest pain.        Chest tightness  Gastrointestinal: Negative.   Musculoskeletal: Negative.   Neurological: Negative.   Psychiatric/Behavioral: Negative.   All other systems reviewed and are negative.    PHYSICAL EXAM: VS:  BP 138/78 (BP Location: Left Arm, Patient Position: Sitting, Cuff Size: Normal)   Pulse 85   Ht 5\' 2"  (1.575 m)   Wt 229 lb 12 oz (104.2 kg)   BMI 42.02 kg/m  , BMI Body mass index is 42.02 kg/m. GEN: Well nourished, well developed, in no acute distress , obese HEENT: normal  Neck: no JVD, carotid bruits, or masses Cardiac: RRR; no murmurs, rubs, or gallops,no edema  Respiratory:  clear to auscultation bilaterally, normal work of breathing GI: soft, nontender, nondistended, + BS MS: no deformity or atrophy  Skin: warm and dry, no rash Neuro:  Strength and sensation are intact Psych: euthymic mood, full affect    Recent Labs: 09/29/2016: ALT 10 01/24/2017: BUN 12; Creatinine, Ser 0.74; Potassium 4.9; Sodium 137    Lipid Panel Lab Results  Component Value Date   CHOL 173 01/24/2017   HDL 28.90 (L) 01/24/2017   LDLCALC 148 (H) 05/29/2015   TRIG 274.0 (H) 01/24/2017      Wt Readings from Last 3 Encounters:  08/04/17 229 lb 12 oz (104.2 kg)  08/01/17 227 lb 8 oz (103.2 kg)  05/01/17 224 lb (101.6 kg)       ASSESSMENT AND PLAN:  Type 2 diabetes mellitus with other circulatory complication, with long-term current use of insulin (HCC) Stressed importance of aggressive diabetes control CT scan with significant coronary calcification and aortic atherosclerosis extending through her iliac arteries Recommend she start statin Crestor 10 mg daily Goal LDL less than 70  Exertional chest pain - Plan: EKG 12-Lead, NM Myocar Multi W/Spect W/Wall Motion / EF, Lipid Profile Given findings on her CT scan and small history of diabetes, hyperlipidemia, high concern for stable angina Discussed various treatment options for coronary disease and her symptoms Suggested medical  management, versus stress testing with medical management She prefers stress testing to rule out ischemia as a cause of her symptoms Recommended if symptoms get worse that she call our office  Aortic atherosclerosis ([place]) Significant aortic atherosclerosis extending through her iliac arteries including aorta and coronaries Aspirin, Crestor, goal LDL less than 70 Aggressive diabetes control  Coronary artery calcification seen on CAT scan  As detailed above, images pulled up with her in the office today s Stressed importance of above, diabetes, lipids   shortness of breath Getting over upper respiratory tract infection,  chest could be tight from bronchospasm Unable to exclude ischemia given findings Recommended stress test  Mixed hyperlipidemia We have started Crestor 10 mg daily Goal LDL less than 70  Disposition:   F/U  12 months   Total encounter time more than 60 minutes  Greater than  50% was spent in counseling and coordination of care with the patient    Orders Placed This Encounter  Procedures  . NM Myocar Multi W/Spect W/Wall Motion / EF  . Lipid Profile  . EKG 12-Lead     Signed, Esmond Plants, M.D., Ph.D. 08/04/2017  [place], [place]

## 2017-08-04 ENCOUNTER — Encounter: Payer: Self-pay | Admitting: Cardiovascular Disease

## 2017-08-04 ENCOUNTER — Ambulatory Visit: Payer: Medicare HMO | Admitting: Cardiovascular Disease

## 2017-08-04 VITALS — BP 138/78 | HR 85 | Ht 62.0 in | Wt 229.8 lb

## 2017-08-04 DIAGNOSIS — R079 Chest pain, unspecified: Secondary | ICD-10-CM | POA: Diagnosis not present

## 2017-08-04 DIAGNOSIS — I2510 Atherosclerotic heart disease of native coronary artery without angina pectoris: Secondary | ICD-10-CM

## 2017-08-04 DIAGNOSIS — E782 Mixed hyperlipidemia: Secondary | ICD-10-CM | POA: Diagnosis not present

## 2017-08-04 DIAGNOSIS — I700 Atherosclerosis of aorta: Secondary | ICD-10-CM

## 2017-08-04 DIAGNOSIS — E1159 Type 2 diabetes mellitus with other circulatory complications: Secondary | ICD-10-CM

## 2017-08-04 DIAGNOSIS — Z794 Long term (current) use of insulin: Secondary | ICD-10-CM | POA: Diagnosis not present

## 2017-08-04 DIAGNOSIS — R0602 Shortness of breath: Secondary | ICD-10-CM

## 2017-08-04 MED ORDER — ROSUVASTATIN CALCIUM 10 MG PO TABS: 10.0000 mg | ORAL_TABLET | Freq: Every day | ORAL | 6 refills | Status: DC

## 2017-08-04 NOTE — Patient Instructions (Addendum)
Medication Instructions:   Please start crestor 10 mg daily,  Goal LDL <70 Lipid panel in 3 months  Labwork:  No new labs needed  Testing/Procedures:  We will schedule a lexiscan myoview for angina, CAD on CT scan We will call you with the results [place] caregiver has ordered a Stress Test with nuclear imaging. The purpose of this test is to evaluate the blood supply to your heart muscle. This procedure is referred to as a "Non-Invasive Stress Test." This is because other than having an IV started in your vein, nothing is inserted or "invades" your body. Cardiac stress tests are done to find areas of poor blood flow to the heart by determining the extent of coronary artery disease (CAD). Some patients exercise on a treadmill, which naturally increases the blood flow to your heart, while others who are  unable to walk on a treadmill due to physical limitations have a pharmacologic/chemical stress agent called Lexiscan . This medicine will mimic walking on a treadmill by temporarily increasing your coronary blood flow.   Please note: these test may take anywhere between 2-4 hours to complete  PLEASE REPORT TO [facility] AT THE FIRST DESK WILL DIRECT YOU WHERE TO GO  Date of Procedure:_____________________________________  Arrival Time for Procedure:______________________________  Instructions regarding medication:   _XX___ : Hold diabetes medication Metformin (Glucophage) the morning of procedure  _XX___:  Hold Propranolol the night before procedure and morning of procedure  _XX___:  Decrease insulin the night before by HALF   PLEASE NOTIFY THE OFFICE AT LEAST 24 HOURS IN ADVANCE IF YOU ARE UNABLE TO [place].  [phone] AND  PLEASE NOTIFY NUCLEAR MEDICINE AT [facility] AT LEAST 24 HOURS IN ADVANCE IF YOU ARE UNABLE TO KEEP YOUR APPOINTMENT. [phone]  How to prepare for your Myoview test:  1. Do not eat or drink after  midnight 2. No caffeine for 24 hours prior to test 3. No smoking 24 hours prior to test. 4. Your medication may be taken with water.  If your doctor stopped a medication because of this test, do not take that medication. 5. Ladies, please do not wear dresses.  Skirts or pants are appropriate. Please wear a short sleeve shirt. 6. No perfume, cologne or lotion. 7. Wear comfortable walking shoes. No heels!   Follow-Up: It was a pleasure seeing you in the office today. Please call us if you have new issues that need to be addressed before your next appt.  [phone]  Your physician wants you to follow-up in: 12 months.  You will receive a reminder letter in the mail two months in advance. If you don't receive a letter, please call our office to schedule the follow-up appointment.  If you need a refill on your cardiac medications before your next appointment, please call your pharmacy.  For educational health videos Log in to : [email or web address] Or : [email or web address], password : [email or web address]

## 2017-08-08 ENCOUNTER — Other Ambulatory Visit: Payer: Self-pay | Admitting: Family Medicine

## 2017-08-08 ENCOUNTER — Telehealth: Payer: Self-pay | Admitting: Family Medicine

## 2017-08-08 NOTE — Telephone Encounter (Signed)
Copied from [place] [id number]. Topic: Quick Communication - Rx Refill/Question >> Aug 08, 2017  1:13 PM Bea Graff, NT wrote: Medication: metFORMIN (GLUCOPHAGE) 1000 MG tablet Has the patient contacted their pharmacy? Yes.   (Agent: If no, request that the patient contact the pharmacy for the refill.) Preferred Pharmacy (with phone number or street name): [place], [place] [phone] (Phone) [phone] (Fax)   Pt is out of Metformin and needs enough to last until May 3rd when the mail order reaches her   Agent: Please be advised that RX refills may take up to 3 business days. We ask that you follow-up with your pharmacy.

## 2017-08-09 NOTE — Telephone Encounter (Signed)
Attempted to call patient-vno answer- VM not set up- Rx has been sent to [facility] by office.

## 2017-08-10 ENCOUNTER — Telehealth: Payer: Self-pay | Admitting: Cardiovascular Disease

## 2017-08-10 NOTE — Telephone Encounter (Signed)
Patient has a Nuclear Stress Test in the morning Has a question regarding the generic CRESTOR medication  Please call to discuss

## 2017-08-10 NOTE — Telephone Encounter (Signed)
Called patient. She wanted to make sure it was ok to take her rosuvastatin tonight. Advised her that it was ok but she should hold her propranolol, metformin tonight and tomorrow and take 1/2 her nightly insulin tonight. She verbalized understanding and was appreciative.

## 2017-08-11 ENCOUNTER — Encounter
Admission: RE | Admit: 2017-08-11 | Discharge: 2017-08-11 | Disposition: A | Discharge status: Home / Self Care | Payer: Medicare HMO | Source: Ambulatory Visit | Attending: Cardiovascular Disease | Admitting: Cardiovascular Disease

## 2017-08-11 DIAGNOSIS — R079 Chest pain, unspecified: Secondary | ICD-10-CM | POA: Insufficient documentation

## 2017-08-11 LAB — NM MYOCAR MULTI W/SPECT W/WALL MOTION / EF
CHL CUP NUCLEAR SRS: 23
CHL CUP NUCLEAR SSS: 13
CSEPHR: 72 %
LV dias vol: 70 mL (ref 46–106)
LV sys vol: 14 mL
Peak HR: 111 {beats}/min
Rest HR: 90 {beats}/min
SDS: 3
TID: 0.73

## 2017-08-11 MED ORDER — TECHNETIUM TC 99M TETROFOSMIN IV KIT
33.1800 | PACK | Freq: Once | INTRAVENOUS | Status: AC | PRN
  Administered 2017-08-11: 33.18 via INTRAVENOUS

## 2017-08-11 MED ORDER — REGADENOSON 0.4 MG/5ML IV SOLN
0.4000 mg | Freq: Once | INTRAVENOUS | Status: AC
  Administered 2017-08-11: 0.4 mg via INTRAVENOUS
  Filled 2017-08-11: qty 5

## 2017-08-11 MED ORDER — TECHNETIUM TC 99M TETROFOSMIN IV KIT
10.0000 | PACK | Freq: Once | INTRAVENOUS | Status: AC | PRN
  Administered 2017-08-11: 13.62 via INTRAVENOUS

## 2017-11-15 ENCOUNTER — Other Ambulatory Visit
Admission: RE | Admit: 2017-11-15 | Discharge: 2017-11-15 | Disposition: A | Discharge status: Home / Self Care | Payer: Medicare HMO | Source: Ambulatory Visit | Attending: Cardiovascular Disease | Admitting: Cardiovascular Disease

## 2017-11-15 DIAGNOSIS — R079 Chest pain, unspecified: Secondary | ICD-10-CM | POA: Insufficient documentation

## 2017-11-15 LAB — LIPID PANEL
CHOL/HDL RATIO: 4.9 ratio
CHOLESTEROL: 137 mg/dL (ref 0–200)
HDL: 28 mg/dL — ABNORMAL LOW (ref >40)
LDL Cholesterol: 69 mg/dL (ref 0–99)
Triglycerides: 199 mg/dL — ABNORMAL HIGH (ref <150)
VLDL: 40 mg/dL (ref 0–40)

## 2017-11-27 ENCOUNTER — Other Ambulatory Visit: Payer: Self-pay | Admitting: Family Medicine

## 2017-11-27 DIAGNOSIS — E1149 Type 2 diabetes mellitus with other diabetic neurological complication: Secondary | ICD-10-CM

## 2017-11-30 ENCOUNTER — Other Ambulatory Visit (INDEPENDENT_AMBULATORY_CARE_PROVIDER_SITE_OTHER): Payer: Medicare HMO

## 2017-11-30 DIAGNOSIS — E1149 Type 2 diabetes mellitus with other diabetic neurological complication: Secondary | ICD-10-CM | POA: Diagnosis not present

## 2017-11-30 LAB — BASIC METABOLIC PANEL
BUN: 16 mg/dL (ref 6–23)
CHLORIDE: 100 meq/L (ref 96–112)
CO2: 33 mEq/L — ABNORMAL HIGH (ref 19–32)
Calcium: 10.3 mg/dL (ref 8.4–10.5)
Creatinine, Ser: 0.86 mg/dL (ref 0.40–1.20)
GFR: 69.85 mL/min (ref >60.00)
GLUCOSE: 176 mg/dL — AB (ref 70–99)
POTASSIUM: 4.8 meq/L (ref 3.5–5.1)
SODIUM: 140 meq/L (ref 135–145)

## 2017-11-30 LAB — HEMOGLOBIN A1C: Hgb A1c MFr Bld: 10.3 % — ABNORMAL HIGH (ref 4.6–6.5)

## 2017-12-04 ENCOUNTER — Encounter: Payer: Self-pay | Admitting: Family Medicine

## 2017-12-04 ENCOUNTER — Ambulatory Visit (INDEPENDENT_AMBULATORY_CARE_PROVIDER_SITE_OTHER): Payer: Medicare HMO | Admitting: Family Medicine

## 2017-12-04 DIAGNOSIS — R21 Rash and other nonspecific skin eruption: Secondary | ICD-10-CM | POA: Diagnosis not present

## 2017-12-04 DIAGNOSIS — E118 Type 2 diabetes mellitus with unspecified complications: Secondary | ICD-10-CM | POA: Diagnosis not present

## 2017-12-04 DIAGNOSIS — Z794 Long term (current) use of insulin: Secondary | ICD-10-CM

## 2017-12-04 MED ORDER — INSULIN NPH ISOPHANE & REGULAR (70-30) 100 UNIT/ML ~~LOC~~ SUSP: SUBCUTANEOUS | Status: DC

## 2017-12-04 NOTE — Progress Notes (Signed)
Diabetes:  Using medications without difficulties: yes Hypoglycemic episodes: only with prolonged fasting, cautions d/w pt.  Hyperglycemic episodes: no sx Feet problems: at baseline with some numbness, cautions d/w pt.   Blood Sugars averaging: usually 180-190s in the AMs eye exam within last year:yes Labs d/w pt.  A1c d/w pt.  Up from prev.  Diet d/w pt, along with walking.    Still taking crestor.  Recent with a rash on the B forearms.  No clear trigger.  Using corstisone. crestor clearly predates the rash, likely not related.  D/w pt.    Social stressors d/w pt.  She is concerned about her husband at baseline.   Meds, vitals, and allergies reviewed.  ROS: Per HPI unless specifically indicated in ROS section   GEN: nad, alert and oriented HEENT: mucous membranes moist NECK: supple w/o LA CV: rrr. PULM: ctab, no inc wob ABD: soft, +bs EXT: no edema SKIN: Faint bilateral splotchy rash that blanches on the bilateral forearms without ulceration or vesicles.  No lesions on the palms.  Some better per patient report, with OTC hydrocortisone use.  Diabetic foot exam: Normal inspection No skin breakdown No calluses  Normal DP pulses Normal sensation to light touch and monofilament Nails normal

## 2017-12-04 NOTE — Patient Instructions (Signed)
Trying going up on your insulin by 1 unit a day with the morning dose.   Get back to walking and update me about your sugars in about 2 weeks, sooner if needed.  Schedule a yearly physical after 01/25/18.  Take care.  Glad to see you.

## 2017-12-06 NOTE — Assessment & Plan Note (Signed)
Advised patient to try going up on insulin by 1 unit a day with the morning dose.   Get back to walking and update me about sugars in about 2 weeks, sooner if needed.  Schedule a yearly physical after 01/25/18.  She agrees.   Discussed diet.

## 2017-12-06 NOTE — Assessment & Plan Note (Signed)
Unclear trigger.  Does not appear to be an ominous issue.  Some better in the meantime.  Continue over-the-counter hydrocortisone use.  She agrees.

## 2017-12-21 DIAGNOSIS — E1165 Type 2 diabetes mellitus with hyperglycemia: Secondary | ICD-10-CM | POA: Diagnosis not present

## 2017-12-21 LAB — HM DIABETES EYE EXAM

## 2017-12-29 ENCOUNTER — Encounter: Payer: Self-pay | Admitting: Family Medicine

## 2018-01-30 ENCOUNTER — Other Ambulatory Visit: Payer: Self-pay | Admitting: Family Medicine

## 2018-01-31 ENCOUNTER — Ambulatory Visit: Payer: Medicare HMO

## 2018-02-01 ENCOUNTER — Other Ambulatory Visit: Payer: Self-pay | Admitting: Family Medicine

## 2018-02-01 ENCOUNTER — Ambulatory Visit: Payer: Medicare HMO

## 2018-02-01 DIAGNOSIS — Z794 Long term (current) use of insulin: Principal | ICD-10-CM

## 2018-02-01 DIAGNOSIS — E118 Type 2 diabetes mellitus with unspecified complications: Secondary | ICD-10-CM

## 2018-02-15 ENCOUNTER — Ambulatory Visit (INDEPENDENT_AMBULATORY_CARE_PROVIDER_SITE_OTHER): Payer: Medicare HMO

## 2018-02-15 VITALS — BP 130/70 | HR 73 | Temp 98.7°F | Ht 62.0 in | Wt 209.8 lb

## 2018-02-15 DIAGNOSIS — Z23 Encounter for immunization: Secondary | ICD-10-CM | POA: Diagnosis not present

## 2018-02-15 DIAGNOSIS — Z0000 Encounter for general adult medical examination without abnormal findings: Secondary | ICD-10-CM

## 2018-02-15 DIAGNOSIS — Z794 Long term (current) use of insulin: Secondary | ICD-10-CM | POA: Diagnosis not present

## 2018-02-15 DIAGNOSIS — E118 Type 2 diabetes mellitus with unspecified complications: Secondary | ICD-10-CM | POA: Diagnosis not present

## 2018-02-15 LAB — COMPREHENSIVE METABOLIC PANEL
ALT: 8 U/L (ref 0–35)
AST: 12 U/L (ref 0–37)
Albumin: 4.1 g/dL (ref 3.5–5.2)
Alkaline Phosphatase: 95 U/L (ref 39–117)
BILIRUBIN TOTAL: 0.4 mg/dL (ref 0.2–1.2)
BUN: 16 mg/dL (ref 6–23)
CO2: 35 mEq/L — ABNORMAL HIGH (ref 19–32)
Calcium: 10.1 mg/dL (ref 8.4–10.5)
Chloride: 102 mEq/L (ref 96–112)
Creatinine, Ser: 0.78 mg/dL (ref 0.40–1.20)
GFR: 78.14 mL/min (ref >60.00)
GLUCOSE: 77 mg/dL (ref 70–99)
POTASSIUM: 4.6 meq/L (ref 3.5–5.1)
SODIUM: 140 meq/L (ref 135–145)
TOTAL PROTEIN: 7.9 g/dL (ref 6.0–8.3)

## 2018-02-15 LAB — HEMOGLOBIN A1C: HEMOGLOBIN A1C: 8.7 % — AB (ref 4.6–6.5)

## 2018-02-15 NOTE — Progress Notes (Signed)
Subjective:   Alexis [name] is a 67 y.o. female who presents for Medicare Annual (Subsequent) preventive examination.  Review of Systems:  N/A Cardiac Risk Factors include: advanced age (>67men, >86 women);obesity (BMI >30kg/m2);diabetes mellitus;hypertension     Objective:     Vitals: BP 130/70 (BP Location: Left Arm, Patient Position: Sitting, Cuff Size: Large)   Pulse 73   Temp 98.7 F (37.1 C) (Oral)   Ht 5\' 2"  (1.575 m) Comment: no shoes  Wt 209 lb 12 oz (95.1 kg)   SpO2 96%   BMI 38.36 kg/m   Body mass index is 38.36 kg/m.  Advanced Directives 02/15/2018 01/24/2017 01/20/2017 01/12/2017 10/30/2015  Does Patient Have a Medical Advance Directive? No No No No No  Would patient like information on creating a medical advance directive? Yes (MAU/Ambulatory/Procedural Areas - Information given) - - - No - patient declined information    Tobacco Social History   Tobacco Use  Smoking Status Never Smoker  Smokeless Tobacco Never Used     Counseling given: No   Clinical Intake:  Pre-visit preparation completed: Yes  Pain : No/denies pain Pain Score: 0-No pain     Nutritional Status: BMI > 30  Obese Nutritional Risks: None Diabetes: No  How often do you need to have someone help you when you read instructions, pamphlets, or other written materials from your doctor or pharmacy?: 1 - Never What is the last grade level you completed in school?: 12th grade  Interpreter Needed?: No  Comments: pt liives with spouse Information entered by :: LPinson, LPN  Past Medical History:  Diagnosis Date  . Allergy   . Asthma    in childhood  . Benign tumor of pelvic bones, sacrum, and coccyx   . Blood in stool   . Diabetes mellitus    dx'd at age ~62  . GERD (gastroesophageal reflux disease)   . Hx of colonic polyps   . Hyperlipidemia   . Hypertension    dx'd at age ~89  . Sleep apnea    on CPAP  . Tremor    only in face; worse under periods of stress. father with  tremor also   Past Surgical History:  Procedure Laterality Date  . [place]   Surgery included removal of benign pelvic tumor  . COLONOSCOPY  01/20/2017  . LAPAROSCOPIC APPENDECTOMY N/A 10/30/2015   Procedure: APPENDECTOMY LAPAROSCOPIC, lysis of adhesions;  Surgeon: Hubbard Robinson, MD;  Location: [facility] ORS;  Service: General;  Laterality: N/A;  . OVARY BIOPSY  1988  . pelvic bone tumor  1988  . TONSILLECTOMY AND ADENOIDECTOMY  1957  . UPPER GI ENDOSCOPY  01/20/2017   Family History  Problem Relation Age of Onset  . Heart disease Mother        h/o valve replacement, no h/o MI  . Colon cancer Father   . Colon cancer Sister        metastatic (liver?) cancer  . Uterine cancer Sister   . Lung cancer Maternal Grandfather   . Breast cancer Neg Hx    Social History   Socioeconomic History  . Marital status: Married    Spouse name: Not on file  . Number of children: Not on file  . Years of education: Not on file  . Highest education level: Not on file  Occupational History  . Occupation: Payroll Group Lead    Employer: [employer]  Social Needs  . Financial resource strain: Not on file  .  Food insecurity:    Worry: Not on file    Inability: Not on file  . Transportation needs:    Medical: Not on file    Non-medical: Not on file  Tobacco Use  . Smoking status: Never Smoker  . Smokeless tobacco: Never Used  Substance and Sexual Activity  . Alcohol use: No  . Drug use: No  . Sexual activity: Not on file  Lifestyle  . Physical activity:    Days per week: Not on file    Minutes per session: Not on file  . Stress: Not on file  Relationships  . Social connections:    Talks on phone: Not on file    Gets together: Not on file    Attends religious service: Not on file    Active member of club or organization: Not on file    Attends meetings of clubs or organizations: Not on file    Relationship status: Not on file  Other Topics Concern  . Not on file    Social History Narrative   Education:  12th grade   Retired from [employer].   Married 1970 to [employer]   Enjoys playing piano (plays for church), time at [employer], time with grandkids   2 kids    Outpatient Encounter Medications as of 02/15/2018  Medication Sig  . ACCU-CHEK AVIVA PLUS test strip TEST BLOOD SUGAR TWICE DAILY  AND AS NEEDED AS DIRECTED  . ACCU-CHEK SOFTCLIX LANCETS lancets USE AS INSTRUCTED TO CHECK BLOOD SUGAR TWICE DAILY AND AS NEEDED  . aspirin EC 81 MG tablet Take 81 mg by mouth daily.  . enalapril (VASOTEC) 20 MG tablet Take 1 tablet (20 mg total) by mouth daily.  . insulin NPH-regular Human (NOVOLIN 70/30) (70-30) 100 UNIT/ML injection 46 units in AM and 36 units in PM  . Insulin Syringe-Needle U-100 (RELION INSULIN SYR 0.5CC/30G) 30G X 5/16" 0.5 ML MISC Use to inject insulin twice daily as directed.  Diagnosis: E11.9.  Insulin dependent.  Marland Kitchen loratadine (CLARITIN) 10 MG tablet Take 1 tablet (10 mg total) by mouth daily.  . metFORMIN (GLUCOPHAGE) 1000 MG tablet TAKE 1 TABLET TWICE DAILY WITH A MEAL  . omeprazole (PRILOSEC) 20 MG capsule Take 20 mg by mouth daily.  . propranolol (INDERAL) 10 MG tablet Take 1 tablet (10 mg total) by mouth 2 (two) times daily.  . rosuvastatin (CRESTOR) 10 MG tablet Take 1 tablet (10 mg total) by mouth daily.   No facility-administered encounter medications on file as of 02/15/2018.     Activities of Daily Living In your present state of health, do you have any difficulty performing the following activities: 02/15/2018  Hearing? N  Vision? N  Difficulty concentrating or making decisions? N  Walking or climbing stairs? N  Dressing or bathing? N  Doing errands, shopping? N  Preparing Food and eating ? N  Using the Toilet? N  In the past six months, have you accidently leaked urine? N  Do you have problems with loss of bowel control? N  Managing your Medications? N  Managing your Finances? N  Housekeeping or managing your  Housekeeping? N  Some recent data might be hidden    Patient Care Team: [name], MD as PCP - General (Family Medicine)    Assessment:   This is a routine wellness examination for Alexis [name].   Hearing Screening   125Hz  250Hz  500Hz  1000Hz  2000Hz  3000Hz  4000Hz  6000Hz  8000Hz   Right ear:   40 40 40  40    Left ear:   40 40 40  40    Vision Screening Comments: Vision exam in Sept 2019 with Dr. Knox Saliva    Exercise Activities and Dietary recommendations Current Exercise Habits: The patient does not participate in regular exercise at present, Exercise limited by: None identified  Goals    . Increase physical activity     Starting 02/15/2018, I will attempt to walk at 15-30 min 5 days per week.        Fall Risk Fall Risk  02/15/2018 01/24/2017  Falls in the past year? No No   Depression Screen PHQ 2/9 Scores 02/15/2018 01/24/2017  PHQ - 2 Score 0 0  PHQ- 9 Score 0 0     Cognitive Function MMSE - Mini Mental State Exam 02/15/2018 01/24/2017  Orientation to time 5 5  Orientation to Place 5 5  Registration 3 3  Attention/ Calculation 0 0  Recall 3 3  Language- name 2 objects 0 0  Language- repeat 1 1  Language- follow 3 step command 3 3  Language- read & follow direction 0 0  Write a sentence 0 0  Copy design 0 0  Total score 20 20       Immunization History  Administered Date(s) Administered  . Influenza Split 02/15/2011  . Influenza Whole 02/18/2013  . Influenza,inj,Quad PF,6+ Mos 05/29/2015, 02/02/2016, 01/27/2017, 02/15/2018  . Pneumococcal Conjugate-13 11/13/2013  . Pneumococcal Polysaccharide-23 04/18/2008, 08/31/2015    Screening Tests Health Maintenance  Topic Date Due  . TETANUS/TDAP  04/18/2019 (Originally 03/03/2017)  . HEMOGLOBIN A1C  08/16/2018  . MAMMOGRAM  10/18/2018  . FOOT EXAM  12/05/2018  . OPHTHALMOLOGY EXAM  12/22/2018  . COLONOSCOPY  01/20/2022  . INFLUENZA VACCINE  Completed  . DEXA SCAN  Completed  . Hepatitis C Screening   Completed  . PNA vac Low Risk Adult  Completed      Plan:    I have personally reviewed, addressed, and noted the following in the patient's chart:  A. Medical and social history B. Use of alcohol, tobacco or illicit drugs  C. Current medications and supplements D. Functional ability and status E.  Nutritional status F.  Physical activity G. Advance directives H. List of other physicians I.  Hospitalizations, surgeries, and ER visits in previous 12 months J.  [place] to include hearing, vision, cognitive, depression L. Referrals and appointments - none  In addition, I have reviewed and discussed with patient certain preventive protocols, quality metrics, and best practice recommendations. A written personalized care plan for preventive services as well as general preventive health recommendations were provided to patient.  See attached scanned questionnaire for additional information.   Signed,   Lindell Noe, MHA, BS, LPN Health Coach

## 2018-02-15 NOTE — Patient Instructions (Signed)
Ms. Leaf , Thank you for taking time to come for your Medicare Wellness Visit. I appreciate your ongoing commitment to your health goals. Please review the following plan we discussed and let me know if I can assist you in the future.   These are the goals we discussed: Goals    . Increase physical activity     Starting 02/15/2018, I will attempt to walk at 15-30 min 5 days per week.        This is a list of the screening recommended for you and due dates:  Health Maintenance  Topic Date Due  . Tetanus Vaccine  04/18/2019*  . Hemoglobin A1C  08/16/2018  . Mammogram  10/18/2018  . Complete foot exam   12/05/2018  . Eye exam for diabetics  12/22/2018  . Colon Cancer Screening  01/20/2022  . Flu Shot  Completed  . DEXA scan (bone density measurement)  Completed  .  Hepatitis C: One time screening is recommended by Center for Disease Control  (CDC) for  adults born from [birth year] through 1965.   Completed  . Pneumonia vaccines  Completed  *Topic was postponed. The date shown is not the original due date.   Preventive Care for Adults  A healthy lifestyle and preventive care can promote health and wellness. Preventive health guidelines for adults include the following key practices.  . A routine yearly physical is a good way to check with your health care provider about your health and preventive screening. It is a chance to share any concerns and updates on your health and to receive a thorough exam.  . Visit your dentist for a routine exam and preventive care every 6 months. Brush your teeth twice a day and floss once a day. Good oral hygiene prevents tooth decay and gum disease.  . The frequency of eye exams is based on your age, health, family medical history, use  of contact lenses, and other factors. Follow your health care provider's recommendations for frequency of eye exams.  . Eat a healthy diet. Foods like vegetables, fruits, whole grains, low-fat dairy products, and lean  protein foods contain the nutrients you need without too many calories. Decrease your intake of foods high in solid fats, added sugars, and salt. Eat the right amount of calories for you. Get information about a proper diet from your health care provider, if necessary.  . Regular physical exercise is one of the most important things you can do for your health. Most adults should get at least 150 minutes of moderate-intensity exercise (any activity that increases your heart rate and causes you to sweat) each week. In addition, most adults need muscle-strengthening exercises on 2 or more days a week.  Silver Sneakers may be a benefit available to you. To determine eligibility, you may visit the website: www.silversneakers.com or contact program at 571-617-9497 Mon-Fri between 8AM-8PM.   . Maintain a healthy weight. The body mass index (BMI) is a screening tool to identify possible weight problems. It provides an estimate of body fat based on height and weight. Your health care provider can find your BMI and can help you achieve or maintain a healthy weight.   For adults 20 years and older: ? A BMI below 18.5 is considered underweight. ? A BMI of 18.5 to 24.9 is normal. ? A BMI of 25 to 29.9 is considered overweight. ? A BMI of 30 and above is considered obese.   . Maintain normal blood lipids and cholesterol levels by  exercising and minimizing your intake of saturated fat. Eat a balanced diet with plenty of fruit and vegetables. Blood tests for lipids and cholesterol should begin at age 68 and be repeated every 5 years. If your lipid or cholesterol levels are high, you are over 50, or you are at high risk for heart disease, you may need your cholesterol levels checked more frequently. Ongoing high lipid and cholesterol levels should be treated with medicines if diet and exercise are not working.  . If you smoke, find out from your health care provider how to quit. If you do not use tobacco, please  do not start.  . If you choose to drink alcohol, please do not consume more than 2 drinks per day. One drink is considered to be 12 ounces (355 mL) of beer, 5 ounces (148 mL) of wine, or 1.5 ounces (44 mL) of liquor.  . If you are 68-28 years old, ask your health care provider if you should take aspirin to prevent strokes.  . Use sunscreen. Apply sunscreen liberally and repeatedly throughout the day. You should seek shade when your shadow is shorter than you. Protect yourself by wearing long sleeves, pants, a wide-brimmed hat, and sunglasses year round, whenever you are outdoors.  . Once a month, do a whole body skin exam, using a mirror to look at the skin on your back. Tell your health care provider of new moles, moles that have irregular borders, moles that are larger than a pencil eraser, or moles that have changed in shape or color.

## 2018-02-15 NOTE — Progress Notes (Signed)
PCP notes:   Health maintenance:  Flu vaccine - administered  Abnormal screenings:   None  Patient concerns:   None  Nurse concerns:  None  Next PCP appt:   02/16/18 @ 1045

## 2018-02-16 ENCOUNTER — Encounter: Payer: Self-pay | Admitting: Family Medicine

## 2018-02-16 ENCOUNTER — Ambulatory Visit (INDEPENDENT_AMBULATORY_CARE_PROVIDER_SITE_OTHER): Payer: Medicare HMO | Admitting: Family Medicine

## 2018-02-16 VITALS — BP 130/70 | HR 73 | Temp 98.7°F | Ht 62.0 in | Wt 222.5 lb

## 2018-02-16 DIAGNOSIS — R251 Tremor, unspecified: Secondary | ICD-10-CM | POA: Diagnosis not present

## 2018-02-16 DIAGNOSIS — E1140 Type 2 diabetes mellitus with diabetic neuropathy, unspecified: Secondary | ICD-10-CM

## 2018-02-16 DIAGNOSIS — Z7189 Other specified counseling: Secondary | ICD-10-CM

## 2018-02-16 DIAGNOSIS — Z794 Long term (current) use of insulin: Secondary | ICD-10-CM | POA: Diagnosis not present

## 2018-02-16 DIAGNOSIS — N761 Subacute and chronic vaginitis: Secondary | ICD-10-CM | POA: Diagnosis not present

## 2018-02-16 DIAGNOSIS — Z0000 Encounter for general adult medical examination without abnormal findings: Secondary | ICD-10-CM

## 2018-02-16 DIAGNOSIS — E782 Mixed hyperlipidemia: Secondary | ICD-10-CM | POA: Diagnosis not present

## 2018-02-16 MED ORDER — FLUCONAZOLE 150 MG PO TABS: 150.0000 mg | ORAL_TABLET | Freq: Once | ORAL | 0 refills | Status: AC

## 2018-02-16 NOTE — Progress Notes (Signed)
I reviewed health advisor's note, was available for consultation, and agree with documentation and plan.  

## 2018-02-16 NOTE — Progress Notes (Signed)
Diabetes:  Using medications without difficulties: yes Hypoglycemic episodes: no Hyperglycemic episodes: no Feet problems: some tingling, some better with sugar improvement.   Blood Sugars averaging: improved with work on diet, down to 100-130, occ lower eye exam within last year: yes She has been working hard on diet.  She didn't have to inc her insulin . She cut out fried foods and potatoes.  Intentional weight loss noted.    Elevated Cholesterol: Using medications without problems:yes Muscle aches: no Diet compliance:encoruaged.  Exercise:encoruaged Statin intolerant except for tolerating crestor.  D/w pt.    Flu vaccine 2019.  Shingles out of stock Tetanus may be cheaper at the pharmacy.   PNA up to date.  Colonoscopy done 2018.    EGD 2018.   Mammogram 2018 DXA normal 2018 Pap not due.   Advance care planning- husband designated if patient were incapacitated.    She is still putting up with tremor and she'll update me as needed.   Discussed with patient.  She has some groin irritation and itching.  No discharge.  Prev used diflucan with relief.    She thought her weight was measured incorrectly at the prev visit, d/w pt.  Her weight today was correct.  She does have weight loss noted, but not down to 209 recently.    Her husband has chronic joint pains and lung disease.  Discussed.    PMH and SH reviewed  Meds, vitals, and allergies reviewed.   ROS: Per HPI unless specifically indicated in ROS section   GEN: nad, alert and oriented HEENT: mucous membranes moist NECK: supple w/o LA CV: rrr. PULM: ctab, no inc wob ABD: soft, +bs EXT: no edema SKIN: no acute rash Head tremor at baseline.

## 2018-02-16 NOTE — Patient Instructions (Addendum)
Tetanus and shingles shots may be cheaper at the pharmacy.   Thanks for your effort.  Recheck in about 3 months.  The only lab you need to have done for your next diabetic visit is an A1c.  We can do this with a fingerstick test at the office visit.  You do not need a lab visit ahead of time for this.  It does not matter if you are fasting when the lab is done.   Take care.  Glad to see you.  If you have low sugars then cut back on your insulin and update me.  Use diflucan in the meantime.

## 2018-02-18 DIAGNOSIS — Z7189 Other specified counseling: Secondary | ICD-10-CM | POA: Insufficient documentation

## 2018-02-18 NOTE — Assessment & Plan Note (Signed)
She is tolerating Crestor as is.  Encourage work on diet and exercise.  Previous labs discussed with patient.  She agrees with plan.

## 2018-02-18 NOTE — Assessment & Plan Note (Signed)
Advance care planning- husband designated if patient were incapacitated.

## 2018-02-18 NOTE — Assessment & Plan Note (Signed)
She can use Diflucan and update me as needed.  Routine cautions given.

## 2018-02-18 NOTE — Assessment & Plan Note (Signed)
She has been working hard on diet.  She didn't have to inc her insulin . She cut out fried foods and potatoes.  Intentional weight loss noted.  A1c improved, not always at goal.  Discussed.  Recheck periodically.  She may continue to improve with sustained work on diet.  No change in meds at this point. >25 minutes spent in face to face time with patient, >50% spent in counselling or coordination of care.

## 2018-02-18 NOTE — Assessment & Plan Note (Signed)
Flu vaccine 2019.  Shingles out of stock Tetanus may be cheaper at the pharmacy.   PNA up to date.  Colonoscopy done 2018.    EGD 2018.   Mammogram 2018 DXA normal 2018 Pap not due.   Advance care planning- husband designated if patient were incapacitated.

## 2018-02-18 NOTE — Assessment & Plan Note (Signed)
She is still putting up with tremor and she'll update me as needed.   Discussed with patient.

## 2018-03-21 ENCOUNTER — Other Ambulatory Visit: Payer: Self-pay | Admitting: Cardiovascular Disease

## 2018-03-21 MED ORDER — ROSUVASTATIN CALCIUM 10 MG PO TABS: 10.0000 mg | ORAL_TABLET | Freq: Every day | ORAL | 2 refills | Status: DC

## 2018-03-21 NOTE — Telephone Encounter (Signed)
°*  STAT* If patient is at the pharmacy, call can be transferred to refill team.   1. Which medications need to be refilled? (please list name of each medication and dose if known)  crestor 10 mg po q d   2. Which pharmacy/location (including street and city if local pharmacy) is medication to be sent to? [facility] mail order   3. Do they need a 30 day or 90 day supply? [place]

## 2018-04-05 DIAGNOSIS — D485 Neoplasm of uncertain behavior of skin: Secondary | ICD-10-CM | POA: Diagnosis not present

## 2018-05-21 ENCOUNTER — Ambulatory Visit: Payer: Medicare HMO | Admitting: Family Medicine

## 2018-05-21 ENCOUNTER — Other Ambulatory Visit: Payer: Self-pay | Admitting: Family Medicine

## 2018-05-25 ENCOUNTER — Encounter: Payer: Self-pay | Admitting: Family Medicine

## 2018-05-25 ENCOUNTER — Ambulatory Visit (INDEPENDENT_AMBULATORY_CARE_PROVIDER_SITE_OTHER): Payer: Medicare HMO | Admitting: Family Medicine

## 2018-05-25 VITALS — BP 156/90 | HR 106 | Temp 98.9°F | Ht 62.0 in

## 2018-05-25 DIAGNOSIS — E119 Type 2 diabetes mellitus without complications: Secondary | ICD-10-CM | POA: Diagnosis not present

## 2018-05-25 DIAGNOSIS — Z794 Long term (current) use of insulin: Secondary | ICD-10-CM | POA: Diagnosis not present

## 2018-05-25 DIAGNOSIS — M25569 Pain in unspecified knee: Secondary | ICD-10-CM | POA: Diagnosis not present

## 2018-05-25 DIAGNOSIS — E1140 Type 2 diabetes mellitus with diabetic neuropathy, unspecified: Secondary | ICD-10-CM | POA: Diagnosis not present

## 2018-05-25 DIAGNOSIS — M5430 Sciatica, unspecified side: Secondary | ICD-10-CM

## 2018-05-25 LAB — POCT GLYCOSYLATED HEMOGLOBIN (HGB A1C): Hemoglobin A1C: 8.5 % — AB (ref 4.0–5.6)

## 2018-05-25 MED ORDER — GABAPENTIN 100 MG PO CAPS: 100.0000 mg | ORAL_CAPSULE | Freq: Every day | ORAL | 3 refills | Status: DC

## 2018-05-25 NOTE — Patient Instructions (Addendum)
If you are having any lows in the morning prior to eating, then cut the PM insulin dose back by 1 unit per night.  Plan on a recheck in about 3 months with A1c at the visit.  Keep working on [employer].  Try ice on your knee.   Start gabapentin at night and gradually increase the dose for the leg/foot pain. Take care.  Glad to see you.

## 2018-05-25 NOTE — Progress Notes (Signed)
Diabetes:  Using medications without difficulties: yes Hypoglycemic episodes: rare lows, down to 70s in the AMs occ.   Hyperglycemic episodes: no Feet problems:  some tingling and foot pain noted.   Blood Sugars averaging: usually ~100 - 130 in the AMs eye exam within last year: yes A1c 8.5.  Improved from prev.  "I've been working on it."  She has been more active, home repairs.    She has R leg pain, radiating down the entire R leg.  No similar L leg sx.  She has some baseline R leg paresthesias from prior surgery.  R knee has felt tight recently.  She has used heat on her knee, hasn't used ice.   Meds, vitals, and allergies reviewed.  ROS: Per HPI unless specifically indicated in ROS section   GEN: nad, alert and oriented HEENT: mucous membranes moist NECK: supple w/o LA CV: rrr. PULM: ctab, no inc wob ABD: soft, +bs EXT: no edema SKIN: no acute rash Right knee without crepitus on range of motion but slightly puffy.  No erythema.  No bruising. Right straight leg raise negative. Able to bear weight.  Diabetic foot exam: Normal inspection No skin breakdown No calluses  Normal DP pulses Normal sensation to light touch and but dec to monofilament Nails normal

## 2018-05-27 DIAGNOSIS — M5430 Sciatica, unspecified side: Secondary | ICD-10-CM | POA: Insufficient documentation

## 2018-05-27 DIAGNOSIS — M25569 Pain in unspecified knee: Secondary | ICD-10-CM | POA: Insufficient documentation

## 2018-05-27 NOTE — Assessment & Plan Note (Signed)
Complicated by baseline diabetic neuropathy.  Discussed options.  Can start low-dose of gabapentin and gradually work up with routine cautions.  Rationale for treatment discussed with patient.  She understood.

## 2018-05-27 NOTE — Assessment & Plan Note (Signed)
A1c 8.5.  Improved from prev.  "I've been working on it."  She has been more active, home repairs.    Discussed options.  Goal to avoid hypoglycemia. If having any lows in the morning prior to eating, then cut the PM insulin dose back by 1 unit per night.  Plan on a recheck in about 3 months with A1c at the visit.  Keep working on diet.  She agrees.  See discussion of gabapentin on sciatica discussion

## 2018-05-27 NOTE — Assessment & Plan Note (Signed)
She does not have noted crepitus on exam but I would still expect her to have some level of osteoarthritic changes given her age.  Discussed icing her knee.  She starting gabapentin anyway for her sciatica and neuropathy and only wanted to change one medication at a time if possible.  She will update me as needed.  She agrees.

## 2018-06-14 DIAGNOSIS — D485 Neoplasm of uncertain behavior of skin: Secondary | ICD-10-CM | POA: Diagnosis not present

## 2018-06-14 DIAGNOSIS — L821 Other seborrheic keratosis: Secondary | ICD-10-CM | POA: Diagnosis not present

## 2018-08-16 ENCOUNTER — Other Ambulatory Visit: Payer: Self-pay | Admitting: Family Medicine

## 2018-08-16 DIAGNOSIS — E119 Type 2 diabetes mellitus without complications: Secondary | ICD-10-CM

## 2018-08-21 ENCOUNTER — Encounter: Payer: Self-pay | Admitting: Family Medicine

## 2018-08-21 ENCOUNTER — Other Ambulatory Visit: Payer: Medicare HMO

## 2018-08-21 ENCOUNTER — Ambulatory Visit: Payer: Self-pay | Admitting: *Deleted

## 2018-08-21 ENCOUNTER — Other Ambulatory Visit: Payer: Self-pay

## 2018-08-21 ENCOUNTER — Ambulatory Visit (INDEPENDENT_AMBULATORY_CARE_PROVIDER_SITE_OTHER): Payer: Medicare HMO | Admitting: Family Medicine

## 2018-08-21 DIAGNOSIS — E1140 Type 2 diabetes mellitus with diabetic neuropathy, unspecified: Secondary | ICD-10-CM | POA: Diagnosis not present

## 2018-08-21 DIAGNOSIS — Z794 Long term (current) use of insulin: Secondary | ICD-10-CM | POA: Diagnosis not present

## 2018-08-21 DIAGNOSIS — J310 Chronic rhinitis: Secondary | ICD-10-CM | POA: Diagnosis not present

## 2018-08-21 MED ORDER — AMOXICILLIN-POT CLAVULANATE 875-125 MG PO TABS: 1.0000 | ORAL_TABLET | Freq: Two times a day (BID) | ORAL | 0 refills | Status: DC

## 2018-08-21 NOTE — Progress Notes (Signed)
Virtual visit completed through WebEx or similar program Patient location: home  Provider location: [place] at [facility], office   Limitations and rationale for visit method d/w patient.  Patient agreed to proceed.   CC: Dm2/URI sx.    HPI:  Pandemic considerations d/w pt.   URI vs allergic rhinitis sx.   duration of symptoms: going on for about 1 week.   rhinorrhea:yes congestion:yes ear pain: some L ear pain.  sore throat: from post nasal gtt She has some L upper tooth pain.  She hasn't been able to see dentist in the meantime.  She has h/o chipped tooth.   Cough: mild, likely from post nasal gtt.   Myalgias: no Temp max was 99.6.  None >100.  99.3 this AM. Has been lower/normal prev.   She didn't know if this was related to pollen exposure recently.  Taking claritin recently.  She is wearing a pollen mask when in the yards.   L max sinus is ttp.  Some years she has more allergy sx then others.    She has limited her exposure to others.  She wears a mask.  No known covid exposure.    Diabetes:  Using medications without difficulties: yes Hypoglycemic episodes: rare, cautions d/w pt.   Hyperglycemic episodes: no Feet problems:  At baseline, some relief with gabapentin.  Blood Sugars averaging:140-150s recently.  Prev was lower, prior to the above illness.   Meds and allergies reviewed.   ROS: Per HPI unless specifically indicated in ROS section   NAD Speech wnl Head tremor at baseline.    A/P:  DM2.  She'll call about lab visit.  No change in meds at this point.  A1c pending.  Continue work on diet.  Rhinitis.  Consider starting augmentin now vs WASP rx with continued claritin.  She opts for WASP.  This is reasonable.  Update me as needed.

## 2018-08-21 NOTE — Telephone Encounter (Signed)
Robin from [facility] called to request triage for pt of Dr. [name]. Patient called with having a fever of 99.2 this morning. Yesterday she was 98.3. She has been low grade fevers since last week off and on. She thinks it is the pollen, has nasal drainage and a cough from that.  She has a slight left ear ache. She is also having a problem with her tooth,which last Friday or Saturday. She can't go to the dentist right now. She denies shortness of breath, chest tightness, body aches, fatigue or chills. She has not been around anyone that has been sick.  She is wearing the mask and gloves when she goes to the grocery store. And she has not traveled out of state. Advised patient to call back with any of the symptoms mentioned above. Pt voiced understanding. She has an appointment scheduled for Thursday. Per per protocol,she is to see a provider within 3 days.  Routing to flow at [facility] at [facility] for review.  Reason for Disposition . [1] Intermittent fever > 100.0 F (37.8 C) AND [2] lasts > 3 weeks  Answer Assessment - Initial Assessment Questions 1. TEMPERATURE: "What is the most recent temperature?"  "How was it measured?"      99.2 was this morning and took it by placing on her forehead 2. ONSET: "When did the fever start?"      Last week 3. SYMPTOMS: "Do you have any other symptoms besides the fever?"  (e.g., colds, headache, sore throat, earache, cough, rash, diarrhea, vomiting, abdominal pain)     Yesterday had headache, cough with drainage, left earache,  4. CAUSE: If there are no symptoms, ask: "What do you think is causing the fever?"      n/a 5. CONTACTS: "Does anyone else in the family have an infection?"     no 6. TREATMENT: "What have you done so far to treat this fever?" (e.g., medications)     Medications, fluids 7. IMMUNOCOMPROMISE: "Do you have of the following: diabetes, HIV positive, splenectomy, cancer chemotherapy, chronic steroid treatment, transplant patient,  etc."     Diabetes,  8. PREGNANCY: "Is there any chance you are pregnant?" "When was your last menstrual period?"     no 9. TRAVEL: "Have you traveled out of the country in the last month?" (e.g., travel history, exposures)     no  Protocols used: FEVER-A-AH

## 2018-08-21 NOTE — Telephone Encounter (Signed)
Has OV scheduled.  Thanks.

## 2018-08-22 DIAGNOSIS — J310 Chronic rhinitis: Secondary | ICD-10-CM | POA: Insufficient documentation

## 2018-08-22 NOTE — Assessment & Plan Note (Signed)
Still could be due to seasonal allergic triggers versus relatively mild sinusitis.  Consider starting augmentin now vs WASP rx with continued claritin.  She opts for WASP.  This is reasonable.  Update me as needed.

## 2018-08-22 NOTE — Assessment & Plan Note (Signed)
She'll call about lab visit.  No change in meds at this point.  A1c pending.  Continue work on diet.

## 2018-08-23 ENCOUNTER — Ambulatory Visit: Payer: Medicare HMO | Admitting: Family Medicine

## 2018-10-08 ENCOUNTER — Other Ambulatory Visit (INDEPENDENT_AMBULATORY_CARE_PROVIDER_SITE_OTHER): Payer: Medicare HMO

## 2018-10-08 DIAGNOSIS — E119 Type 2 diabetes mellitus without complications: Secondary | ICD-10-CM | POA: Diagnosis not present

## 2018-10-08 LAB — POCT GLYCOSYLATED HEMOGLOBIN (HGB A1C): Hemoglobin A1C: 9.5 % — AB (ref 4.0–5.6)

## 2018-10-12 ENCOUNTER — Telehealth: Payer: Self-pay

## 2018-10-12 NOTE — Telephone Encounter (Signed)
[facility] - Client Nonclinical Telephone Record AccessNurse Client [place] [facility] - Client Client Site [facility] Physician Renford Dills - MD Contact Type Call Who Is Calling Patient / Member / Family / Caregiver Caller Name Rasheena Talmadge Caller Phone Number [phone] Call Type Message Only Information Provided Reason for Call Returning a Call from the Office Initial [place] reports she is returning a call to the office. Additional Comment Provided Office Hours. Call Closed By: Erich Montane Transaction Date/Time: 10/11/2018 6:44:00 PM (ET)

## 2018-10-12 NOTE — Telephone Encounter (Signed)
Already spoke with patient, see lab result note

## 2018-10-18 ENCOUNTER — Ambulatory Visit (INDEPENDENT_AMBULATORY_CARE_PROVIDER_SITE_OTHER): Payer: Medicare HMO | Admitting: Family Medicine

## 2018-10-18 ENCOUNTER — Other Ambulatory Visit: Payer: Self-pay

## 2018-10-18 DIAGNOSIS — Z794 Long term (current) use of insulin: Secondary | ICD-10-CM | POA: Diagnosis not present

## 2018-10-18 DIAGNOSIS — E1140 Type 2 diabetes mellitus with diabetic neuropathy, unspecified: Secondary | ICD-10-CM

## 2018-10-18 NOTE — Progress Notes (Signed)
Virtual visit completed through WebEx or similar program Patient location: home  Provider location: Financial controller at [facility], office   Pandemic considerations d/w pt.   Limitations and rationale for visit method d/w patient.  Patient agreed to proceed.   Diabetes:  Using medications without difficulties:yes Hypoglycemic episodes:no Hyperglycemic episodes:no but only checking in the AM usually.  Feet problems:some tingling at baseline.  Better with gabapentin.   Blood Sugars averaging: see below.   eye exam within last year: Labs d/w pt. A1c 9.5.  Her sugars were generally 120s in the AM, occ lower.  She was not expected A1c increase.  She isn't usually checking sugar often later in the day.   Diet discussed.    We discussed pandemic and stressors.  Sleep disrupted, with her inc level alertness due to concern for her husband.  She is considering options, ie having him carry a phone vs a fall button.  She'll update me as needed.    Meds and allergies reviewed.   ROS: Per HPI unless specifically indicated in ROS section   NAD Speech wnl  A/P: DM2 D/w pt about checking sugar before and then 2 hours after a meal.  She'll update me with her sugar readings and then we'll go from there, about potentially adjusting her insulin, setting f/u, etc.  She agrees.   We discussed pandemic and stressors.  Sleep disrupted, with her inc level alertness due to concern for her husband.  She is considering options, ie having him carry a phone vs a fall button.  She'll update me as needed.

## 2018-10-20 NOTE — Assessment & Plan Note (Signed)
D/w pt about checking sugar before and then 2 hours after a meal.  She'll update me with her sugar readings and then we'll go from there, about potentially adjusting her insulin, setting f/u, etc.  She agrees.   We discussed pandemic and stressors.  Sleep disrupted, with her inc level alertness due to concern for her husband.  She is considering options, ie having him carry a phone vs a fall button.  She'll update me as needed.

## 2018-11-20 ENCOUNTER — Other Ambulatory Visit: Payer: Self-pay | Admitting: Cardiovascular Disease

## 2018-11-21 NOTE — Telephone Encounter (Signed)
Pt overdue for 12 month f/u last seen 07/2017. Please contact pt for appointment pt needing refills.

## 2018-11-21 NOTE — Telephone Encounter (Signed)
Called patient and she states she will call us back

## 2018-11-27 NOTE — Telephone Encounter (Signed)
°*  STAT* If patient is at the pharmacy, call can be transferred to refill team.   1. Which medications need to be refilled? (please list name of each medication and dose if known)   Rosuvastatin 10 mg po q d   2. Which pharmacy/location (including street and city if local pharmacy) is medication to be sent to? [facility] order  3. Do they need a 30 day or 90 day supply? [place]

## 2018-11-28 NOTE — Telephone Encounter (Signed)
Patient needs an appointment for further refills. If patient does not want to schedule an appointment please make them aware to contact PCP for refills. I will send in a 30 day supply until an appointment can be made. .   Thanks Ladies!

## 2018-12-05 ENCOUNTER — Telehealth: Payer: Self-pay

## 2018-12-05 NOTE — Telephone Encounter (Signed)
Dr [name] reviewed patient's blood sugar readings log she dropped off the other day and he notes that readings are generally reasonable and wanted to know if patient has been working on diet and exercise (see paper notes scanned). Patient states she has been working on cutting portions down and eating healthier. No regular exercises besides yard work.

## 2018-12-06 NOTE — Telephone Encounter (Signed)
Patient advised.

## 2018-12-06 NOTE — Telephone Encounter (Signed)
Given the lower numbers and her work on diet/exercise, then I wouldn't change her meds in the meantime.  I thank her for her effort.  Would recheck later this year as scheduled, sooner if she has sig sugar elevations or concerns.  Thanks.

## 2018-12-19 NOTE — Telephone Encounter (Signed)
No ans no vm   °

## 2018-12-28 ENCOUNTER — Telehealth: Payer: Self-pay | Admitting: Physician Assistant

## 2018-12-28 NOTE — Telephone Encounter (Signed)
Spoke with the patient. Patient sts that the issue has been resolved. [employer] is re-mailing her Crestor to the correct PO Box. Patient has been out of the medication for a day. Adv the patient that it will be ok to resume once received. No further action needed. Patient voiced appreciation for the call.

## 2018-12-28 NOTE — Telephone Encounter (Signed)
[employer] pharmacy called because Alexis [name] was running out of rosuvastatin and needed an emergency refill.  They stated they had been unable to deliver it to her street address.  I called the patient and she stated there was no mailbox on the street address, they get their mail at the PO Box.  She was very upset because things had gotten so messed up.  I was able to get [employer] the PO Box address.  They stated they would send the medication out to that.  I told the patient that we would have someone from the office call her and work out the kinks.  Rosaria Ferries, PA-C 12/28/2018 9:54 AM Beeper [phone]

## 2019-01-12 NOTE — Progress Notes (Signed)
Cardiology Office Note  Date:  01/14/2019   ID:  Alexis [name], DOB August 13, 1950, MRN [id number]  PCP:  Alexis G[place], MD   Chief Complaint  Patient presents with  . other    Aortic atherosclerosis/chest pain.Mediations reviewed verbally. 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  Likely normal Normal wall motion, EF estimated at 63% No EKG changes concerning for ischemia at peak stress or in recovery. Low risk scan  Lab work reviewed with her Hemoglobin A1c elevated most recently 9.3 previously 11 Tolerating statin, LDL down to 69  EKG personally reviewed by myself on todays visit Shows normal sinus rhythm rate 80 bpm no significant ST or T wave changes  Family hx Mother with valve disease, 15, lived until 48, atrial fib Father: sepsis, aspiration  PMH:   has a past medical history of Allergy, Asthma, Benign tumor of pelvic bones, sacrum, and coccyx, Blood in stool, Diabetes mellitus, GERD (gastroesophageal reflux disease), colonic polyps, Hyperlipidemia, Hypertension, Sleep apnea, and Tremor.  PSH:    Past Surgical History:  Procedure Laterality Date  . [place]   Surgery included removal of benign pelvic tumor  . COLONOSCOPY  01/20/2017  . LAPAROSCOPIC APPENDECTOMY N/A 10/30/2015   Procedure: APPENDECTOMY LAPAROSCOPIC, lysis of adhesions;  Surgeon: Hubbard Robinson, MD;  Location: [facility] ORS;  Service: General;  Laterality: N/A;  . OVARY BIOPSY  1988  . pelvic bone tumor  1988  . TONSILLECTOMY AND ADENOIDECTOMY  1957  . UPPER GI ENDOSCOPY  01/20/2017    Current Outpatient Medications  Medication Sig Dispense Refill  . ACCU-CHEK AVIVA PLUS test strip TEST BLOOD SUGAR TWICE DAILY  AND AS NEEDED AS DIRECTED 300 each 3  . ACCU-CHEK SOFTCLIX LANCETS lancets USE AS INSTRUCTED TO CHECK BLOOD SUGAR TWICE DAILY AND AS NEEDED 300 each 2  . aspirin EC 81 MG tablet Take 81 mg by mouth daily.    . DROPLET INSULIN SYRINGE 30G X 5/16" 0.5 ML MISC USE TO INJECT INSULIN TWICE DAILY AS DIRECTED 200 each 2  . enalapril (VASOTEC) 20 MG tablet TAKE 1 TABLET (20 MG TOTAL) BY MOUTH DAILY. 90 tablet 2  . gabapentin (NEURONTIN) 100 MG capsule Take 1-3 capsules (100-300 mg total) by mouth at bedtime. 90 capsule 3  . insulin NPH-regular Human (NOVOLIN 70/30) (70-30) 100 UNIT/ML injection 46 units in AM and 36 units in PM 10 mL   . loratadine (CLARITIN) 10 MG tablet Take 1 tablet (10 mg total) by mouth daily.    . metFORMIN (GLUCOPHAGE) 1000 MG tablet TAKE 1 TABLET TWICE DAILY WITH A MEAL 180 tablet 2  . omeprazole (PRILOSEC) 20 MG capsule Take 20 mg by mouth daily.    . propranolol (INDERAL) 10 MG tablet TAKE 1 TABLET TWICE DAILY 180 tablet 2  . rosuvastatin (CRESTOR) 10 MG tablet TAKE 1 TABLET (10 MG TOTAL) BY MOUTH DAILY. 90 tablet 0   No current facility-administered medications for  this visit.      Allergies:   Statins   Social History:  The patient  reports that she has never smoked. She has never used smokeless tobacco. She reports that she does not drink alcohol or use drugs.   Family History:   family history includes Colon cancer in her father and sister; Heart disease in her mother; Lung cancer in her maternal grandfather; Uterine cancer in her sister.    Review of Systems: Review of Systems  Constitutional: Negative.   HENT: Negative.   Respiratory:  Positive for shortness of breath.   Cardiovascular: Negative.   Gastrointestinal: Negative.   Musculoskeletal: Negative.   Neurological: Negative.   Psychiatric/Behavioral: Negative.   All other systems reviewed and are negative.   PHYSICAL EXAM: VS:  BP (!) 154/70 (BP Location: Left Arm, Patient Position: Sitting, Cuff Size: Normal)   Pulse 80   Ht 5\' 2"  (1.575 m)   Wt 228 lb (103.4 kg)   BMI 41.70 kg/m  , BMI Body mass index is 41.7 kg/m. Constitutional:  oriented to person, place, and time. No distress.  HENT:  Head: Grossly normal Eyes:  no discharge. No scleral icterus.  Neck: No JVD, no carotid bruits  Cardiovascular: Regular rate and rhythm, no murmurs appreciated Pulmonary/Chest: Clear to auscultation bilaterally, no wheezes or rails Abdominal: Soft.  no distension.  no tenderness.  Musculoskeletal: Normal range of motion Neurological:  normal muscle tone. Coordination normal. No atrophy Skin: Skin warm and dry Psychiatric: normal affect, pleasant  Recent Labs: 02/15/2018: ALT 8; BUN 16; Creatinine, Ser 0.78; Potassium 4.6; Sodium 140    Lipid Panel Lab Results  Component Value Date   CHOL 137 11/15/2017   HDL 28 (L) 11/15/2017   LDLCALC 69 11/15/2017   TRIG 199 (H) 11/15/2017      Wt Readings from Last 3 Encounters:  01/14/19 228 lb (103.4 kg)  08/21/18 224 lb (101.6 kg)  02/16/18 222 lb 8 oz (100.9 kg)      ASSESSMENT AND PLAN:  Type 2 diabetes mellitus with other circulatory complication, with long-term current use of insulin (HCC) CT scan with significant coronary calcification and aortic atherosclerosis extending through her iliac arteries --Calcium with her concerning the need for aggressive diabetes control.  Weight continues to run very high Recommend she restart her walking program, avoid carbohydrates  Exertional chest pain -CAD with stable angina stress testing no ischemia  No further testing at this time Suggested walking  program  Aortic atherosclerosis (HCC) Significant aortic atherosclerosis extending through her iliac arteries including aorta and coronaries Diabetes is a major issue, A1c still  9 LDL at goal  shortness of breath Recommended weight loss, walking program for conditioning Strongly recommended strengthening her legs  Mixed hyperlipidemia Continue Crestor 10 mg daily LDL at goal  Disposition:   F/U  12 months   Total encounter time more than 25 minutes  Greater than 50% was spent in counseling and coordination of care with the patient    No orders of the defined types were placed in this encounter.    Signed, Esmond Plants, M.D., Ph.D. 01/14/2019  [place], [place]

## 2019-01-14 ENCOUNTER — Ambulatory Visit (INDEPENDENT_AMBULATORY_CARE_PROVIDER_SITE_OTHER): Payer: Medicare HMO | Admitting: Cardiovascular Disease

## 2019-01-14 ENCOUNTER — Encounter: Payer: Self-pay | Admitting: Cardiovascular Disease

## 2019-01-14 ENCOUNTER — Other Ambulatory Visit: Payer: Self-pay

## 2019-01-14 VITALS — BP 154/70 | HR 80 | Ht 62.0 in | Wt 228.0 lb

## 2019-01-14 DIAGNOSIS — R0602 Shortness of breath: Secondary | ICD-10-CM | POA: Diagnosis not present

## 2019-01-14 DIAGNOSIS — Z794 Long term (current) use of insulin: Secondary | ICD-10-CM

## 2019-01-14 DIAGNOSIS — E782 Mixed hyperlipidemia: Secondary | ICD-10-CM

## 2019-01-14 DIAGNOSIS — I700 Atherosclerosis of aorta: Secondary | ICD-10-CM | POA: Diagnosis not present

## 2019-01-14 DIAGNOSIS — E1159 Type 2 diabetes mellitus with other circulatory complications: Secondary | ICD-10-CM | POA: Diagnosis not present

## 2019-01-14 DIAGNOSIS — I2510 Atherosclerotic heart disease of native coronary artery without angina pectoris: Secondary | ICD-10-CM

## 2019-01-14 DIAGNOSIS — R079 Chest pain, unspecified: Secondary | ICD-10-CM

## 2019-01-14 NOTE — Patient Instructions (Signed)

## 2019-02-18 ENCOUNTER — Other Ambulatory Visit: Payer: Self-pay | Admitting: Family Medicine

## 2019-02-18 ENCOUNTER — Other Ambulatory Visit: Payer: Self-pay | Admitting: Cardiovascular Disease

## 2019-02-21 ENCOUNTER — Ambulatory Visit (INDEPENDENT_AMBULATORY_CARE_PROVIDER_SITE_OTHER): Payer: Medicare HMO

## 2019-02-21 ENCOUNTER — Ambulatory Visit: Payer: Medicare HMO

## 2019-02-21 ENCOUNTER — Other Ambulatory Visit: Payer: Self-pay | Admitting: Family Medicine

## 2019-02-21 ENCOUNTER — Other Ambulatory Visit (INDEPENDENT_AMBULATORY_CARE_PROVIDER_SITE_OTHER): Payer: Medicare HMO

## 2019-02-21 DIAGNOSIS — Z0000 Encounter for general adult medical examination without abnormal findings: Secondary | ICD-10-CM

## 2019-02-21 DIAGNOSIS — Z794 Long term (current) use of insulin: Secondary | ICD-10-CM

## 2019-02-21 DIAGNOSIS — E1140 Type 2 diabetes mellitus with diabetic neuropathy, unspecified: Secondary | ICD-10-CM

## 2019-02-21 DIAGNOSIS — Z23 Encounter for immunization: Secondary | ICD-10-CM | POA: Diagnosis not present

## 2019-02-21 LAB — COMPREHENSIVE METABOLIC PANEL
ALT: 10 U/L (ref 0–35)
AST: 14 U/L (ref 0–37)
Albumin: 4 g/dL (ref 3.5–5.2)
Alkaline Phosphatase: 80 U/L (ref 39–117)
BUN: 19 mg/dL (ref 6–23)
CO2: 30 mEq/L (ref 19–32)
Calcium: 9.7 mg/dL (ref 8.4–10.5)
Chloride: 102 mEq/L (ref 96–112)
Creatinine, Ser: 0.78 mg/dL (ref 0.40–1.20)
GFR: 73.29 mL/min (ref >60.00)
Glucose, Bld: 227 mg/dL — ABNORMAL HIGH (ref 70–99)
Potassium: 4.6 mEq/L (ref 3.5–5.1)
Sodium: 139 mEq/L (ref 135–145)
Total Bilirubin: 0.3 mg/dL (ref 0.2–1.2)
Total Protein: 7.4 g/dL (ref 6.0–8.3)

## 2019-02-21 LAB — LIPID PANEL
Cholesterol: 133 mg/dL (ref 0–200)
HDL: 25.9 mg/dL — ABNORMAL LOW (ref >39.00)
NonHDL: 107.28
Total CHOL/HDL Ratio: 5
Triglycerides: 223 mg/dL — ABNORMAL HIGH (ref 0.0–149.0)
VLDL: 44.6 mg/dL — ABNORMAL HIGH (ref 0.0–40.0)

## 2019-02-21 LAB — HEMOGLOBIN A1C: Hgb A1c MFr Bld: 8.4 % — ABNORMAL HIGH (ref 4.6–6.5)

## 2019-02-21 LAB — LDL CHOLESTEROL, DIRECT: Direct LDL: 78 mg/dL

## 2019-02-21 NOTE — Patient Instructions (Addendum)
Alexis [name] , Thank you for taking time to come for your Medicare Wellness Visit. I appreciate your ongoing commitment to your health goals. Please review the following plan we discussed and let me know if I can assist you in the future.   Screening recommendations/referrals: Colonoscopy: up to date, completed 01/20/2017 Mammogram: Patient will schedule appointment  Bone Density: up to date, completed 03/06/2017 Recommended yearly ophthalmology/optometry visit for glaucoma screening and checkup Recommended yearly dental visit for hygiene and checkup  Vaccinations: Influenza vaccine: up to date, completed 02/21/2019 Pneumococcal vaccine: Completed series Tdap vaccine: declined Shingles vaccine: declined at this time    Advanced directives: Advance directive discussed with you today. Even though you declined this today please call our office should you change your mind and we can give you the proper paperwork for you to fill out.  Conditions/risks identified: diabetes, hyperlipidemia  Next appointment: 02/28/2019 @ 9:45 am   Preventive Care 65 Years and Older, Female Preventive care refers to lifestyle choices and visits with your health care provider that can promote health and wellness. What does preventive care include?  A yearly physical exam. This is also called an annual well check.  Dental exams once or twice a year.  Routine eye exams. Ask your health care provider how often you should have your eyes checked.  Personal lifestyle choices, including:  Daily care of your teeth and gums.  Regular physical activity.  Eating a healthy diet.  Avoiding tobacco and drug use.  Limiting alcohol use.  Practicing safe sex.  Taking low-dose aspirin every day.  Taking vitamin and mineral supplements as recommended by your health care provider. What happens during an annual well check? The services and screenings done by your health care provider during your annual well check  will depend on your age, overall health, lifestyle risk factors, and family history of disease. Counseling  Your health care provider may ask you questions about your:  Alcohol use.  Tobacco use.  Drug use.  Emotional well-being.  Home and relationship well-being.  Sexual activity.  Eating habits.  History of falls.  Memory and ability to understand (cognition).  Work and work Statistician.  Reproductive health. Screening  You may have the following tests or measurements:  Height, weight, and BMI.  Blood pressure.  Lipid and cholesterol levels. These may be checked every 5 years, or more frequently if you are over 76 years old.  Skin check.  Lung cancer screening. You may have this screening every year starting at age 80 if you have a 30-pack-year history of smoking and currently smoke or have quit within the past 15 years.  Fecal occult blood test (FOBT) of the stool. You may have this test every year starting at age 40.  Flexible sigmoidoscopy or colonoscopy. You may have a sigmoidoscopy every 5 years or a colonoscopy every 10 years starting at age 33.  Hepatitis C blood test.  Hepatitis B blood test.  Sexually transmitted disease (STD) testing.  Diabetes screening. This is done by checking your blood sugar (glucose) after you have not eaten for a while (fasting). You may have this done every 1-3 years.  Bone density scan. This is done to screen for osteoporosis. You may have this done starting at age 48.  Mammogram. This may be done every 1-2 years. Talk to your health care provider about how often you should have regular mammograms. Talk with your health care provider about your test results, treatment options, and if necessary, the need for  more tests. Vaccines  Your health care provider may recommend certain vaccines, such as:  Influenza vaccine. This is recommended every year.  Tetanus, diphtheria, and acellular pertussis (Tdap, Td) vaccine. You may  need a Td booster every 10 years.  Zoster vaccine. You may need this after age 58.  Pneumococcal 13-valent conjugate (PCV13) vaccine. One dose is recommended after age 43.  Pneumococcal polysaccharide (PPSV23) vaccine. One dose is recommended after age 45. Talk to your health care provider about which screenings and vaccines you need and how often you need them. This information is not intended to replace advice given to you by your health care provider. Make sure you discuss any questions you have with your health care provider. Document Released: 05/01/2015 Document Revised: 12/23/2015 Document Reviewed: 02/03/2015 Elsevier Interactive Patient Education  2017 [place] Prevention in the Home Falls can cause injuries. They can happen to people of all ages. There are many things you can do to make your home safe and to help prevent falls. What can I do on the outside of my home?  Regularly fix the edges of walkways and driveways and fix any cracks.  Remove anything that might make you trip as you walk through a door, such as a raised step or threshold.  Trim any bushes or trees on the path to your home.  Use bright outdoor lighting.  Clear any walking paths of anything that might make someone trip, such as rocks or tools.  Regularly check to see if handrails are loose or broken. Make sure that both sides of any steps have handrails.  Any raised decks and porches should have guardrails on the edges.  Have any leaves, snow, or ice cleared regularly.  Use sand or salt on walking paths during winter.  Clean up any spills in your garage right away. This includes oil or grease spills. What can I do in the bathroom?  Use night lights.  Install grab bars by the toilet and in the tub and shower. Do not use towel bars as grab bars.  Use non-skid mats or decals in the tub or shower.  If you need to sit down in the shower, use a plastic, non-slip stool.  Keep the floor  dry. Clean up any water that spills on the floor as soon as it happens.  Remove soap buildup in the tub or shower regularly.  Attach bath mats securely with double-sided non-slip rug tape.  Do not have throw rugs and other things on the floor that can make you trip. What can I do in the bedroom?  Use night lights.  Make sure that you have a light by your bed that is easy to reach.  Do not use any sheets or blankets that are too big for your bed. They should not hang down onto the floor.  Have a firm chair that has side arms. You can use this for support while you get dressed.  Do not have throw rugs and other things on the floor that can make you trip. What can I do in the kitchen?  Clean up any spills right away.  Avoid walking on wet floors.  Keep items that you use a lot in easy-to-reach places.  If you need to reach something above you, use a strong step stool that has a grab bar.  Keep electrical cords out of the way.  Do not use floor polish or wax that makes floors slippery. If you must use wax, use non-skid  floor wax.  Do not have throw rugs and other things on the floor that can make you trip. What can I do with my stairs?  Do not leave any items on the stairs.  Make sure that there are handrails on both sides of the stairs and use them. Fix handrails that are broken or loose. Make sure that handrails are as long as the stairways.  Check any carpeting to make sure that it is firmly attached to the stairs. Fix any carpet that is loose or worn.  Avoid having throw rugs at the top or bottom of the stairs. If you do have throw rugs, attach them to the floor with carpet tape.  Make sure that you have a light switch at the top of the stairs and the bottom of the stairs. If you do not have them, ask someone to add them for you. What else can I do to help prevent falls?  Wear shoes that:  Do not have high heels.  Have rubber bottoms.  Are comfortable and fit you  well.  Are closed at the toe. Do not wear sandals.  If you use a stepladder:  Make sure that it is fully opened. Do not climb a closed stepladder.  Make sure that both sides of the stepladder are locked into place.  Ask someone to hold it for you, if possible.  Clearly mark and make sure that you can see:  Any grab bars or handrails.  First and last steps.  Where the edge of each step is.  Use tools that help you move around (mobility aids) if they are needed. These include:  Canes.  Walkers.  Scooters.  Crutches.  Turn on the lights when you go into a dark area. Replace any light bulbs as soon as they burn out.  Set up your furniture so you have a clear path. Avoid moving your furniture around.  If any of your floors are uneven, fix them.  If there are any pets around you, be aware of where they are.  Review your medicines with your doctor. Some medicines can make you feel dizzy. This can increase your chance of falling. Ask your doctor what other things that you can do to help prevent falls. This information is not intended to replace advice given to you by your health care provider. Make sure you discuss any questions you have with your health care provider. Document Released: 01/29/2009 Document Revised: 09/10/2015 Document Reviewed: 05/09/2014 Elsevier Interactive Patient Education  2017 [employer].

## 2019-02-21 NOTE — Progress Notes (Signed)
PCP notes:  Health Maintenance: Declined Shingrix Patient will schedule mammogram Patient will schedule eye exam.   Abnormal Screenings: none   Patient concerns: none   Nurse concerns: none   Next PCP appt.: 02/28/2019 @ 9:45 am

## 2019-02-21 NOTE — Progress Notes (Signed)
Subjective:   Alexis [name] is a 68 y.o. female who presents for Medicare Annual (Subsequent) preventive examination.  Review of Systems:    This visit is being conducted through telemedicine via telephone at the nurse health advisor's home address due to the COVID-19 pandemic. This patient has given me verbal consent via doximity to conduct this visit, patient states they are participating from their home address. Patient and myself are on the telephone call. There is no referral for this visit. Some vital signs may be absent or patient reported.    Patient identification: identified by name, DOB, and current address   Cardiac Risk Factors include: advanced age (>71men, >54 women);diabetes mellitus;dyslipidemia     Objective:     Vitals: There were no vitals taken for this visit.  There is no height or weight on file to calculate BMI.  Advanced Directives 02/21/2019 02/15/2018 01/24/2017 01/20/2017 01/12/2017 10/30/2015  Does Patient Have a Medical Advance Directive? No No No No No No  Would patient like information on creating a medical advance directive? No - Patient declined Yes (MAU/Ambulatory/Procedural Areas - Information given) - - - No - patient declined information    Tobacco Social History   Tobacco Use  Smoking Status Never Smoker  Smokeless Tobacco Never Used     Counseling given: Not Answered   Clinical Intake:  Pre-visit preparation completed: Yes  Pain : No/denies pain     Nutritional Risks: None Diabetes: Yes CBG done?: No Did pt. bring in CBG monitor from home?: No  How often do you need to have someone help you when you read instructions, pamphlets, or other written materials from your doctor or pharmacy?: 1 - Never What is the last grade level you completed in school?: 12th  Interpreter Needed?: No  Information entered by :: CJohnson, LPN  Past Medical History:  Diagnosis Date  . Allergy   . Asthma    in childhood  . Benign tumor of pelvic  bones, sacrum, and coccyx   . Blood in stool   . Diabetes mellitus    dx'd at age ~58  . GERD (gastroesophageal reflux disease)   . Hx of colonic polyps   . Hyperlipidemia   . Hypertension    dx'd at age ~72  . Sleep apnea    on CPAP  . Tremor    only in face; worse under periods of stress. father with tremor also   Past Surgical History:  Procedure Laterality Date  . [place]   Surgery included removal of benign pelvic tumor  . COLONOSCOPY  01/20/2017  . LAPAROSCOPIC APPENDECTOMY N/A 10/30/2015   Procedure: APPENDECTOMY LAPAROSCOPIC, lysis of adhesions;  Surgeon: Hubbard Robinson, MD;  Location: [facility] ORS;  Service: General;  Laterality: N/A;  . OVARY BIOPSY  1988  . pelvic bone tumor  1988  . TONSILLECTOMY AND ADENOIDECTOMY  1957  . UPPER GI ENDOSCOPY  01/20/2017   Family History  Problem Relation Age of Onset  . Heart disease Mother        h/o valve replacement, no h/o MI  . Colon cancer Father   . Colon cancer Sister        metastatic (liver?) cancer  . Uterine cancer Sister   . Lung cancer Maternal Grandfather   . Breast cancer Neg Hx    Social History   Socioeconomic History  . Marital status: Married    Spouse name: Not on file  . Number of children: Not on file  .  Years of education: Not on file  . Highest education level: Not on file  Occupational History  . Occupation: Payroll Group Lead    Employer: [employer]  Social Needs  . Financial resource strain: Not hard at all  . Food insecurity    Worry: Never true    Inability: Never true  . Transportation needs    Medical: No    Non-medical: No  Tobacco Use  . Smoking status: Never Smoker  . Smokeless tobacco: Never Used  Substance and Sexual Activity  . Alcohol use: No  . Drug use: No  . Sexual activity: Not on file  Lifestyle  . Physical activity    Days per week: 0 days    Minutes per session: 0 min  . Stress: Only a little  Relationships  . Social Herbalist on  phone: Not on file    Gets together: Not on file    Attends religious service: Not on file    Active member of club or organization: Not on file    Attends meetings of clubs or organizations: Not on file    Relationship status: Not on file  Other Topics Concern  . Not on file  Social History Narrative   Education:  12th grade   Retired from [employer].   Married 1970 to [employer]   Enjoys playing piano (plays for church), time at [employer], time with grandkids   2 kids    Outpatient Encounter Medications as of 02/21/2019  Medication Sig  . ACCU-CHEK AVIVA PLUS test strip TEST BLOOD SUGAR TWICE DAILY  AND AS NEEDED AS DIRECTED  . ACCU-CHEK SOFTCLIX LANCETS lancets USE AS INSTRUCTED TO CHECK BLOOD SUGAR TWICE DAILY AND AS NEEDED  . aspirin EC 81 MG tablet Take 81 mg by mouth daily.  . DROPLET INSULIN SYRINGE 30G X 5/16" 0.5 ML MISC USE TO INJECT INSULIN TWICE DAILY AS DIRECTED  . enalapril (VASOTEC) 20 MG tablet TAKE 1 TABLET (20 MG TOTAL) BY MOUTH DAILY.  Marland Kitchen gabapentin (NEURONTIN) 100 MG capsule Take 1-3 capsules (100-300 mg total) by mouth at bedtime.  . insulin NPH-regular Human (NOVOLIN 70/30) (70-30) 100 UNIT/ML injection 46 units in AM and 36 units in PM  . loratadine (CLARITIN) 10 MG tablet Take 1 tablet (10 mg total) by mouth daily.  . metFORMIN (GLUCOPHAGE) 1000 MG tablet TAKE 1 TABLET TWICE DAILY WITH A MEAL  . omeprazole (PRILOSEC) 20 MG capsule Take 20 mg by mouth daily.  . propranolol (INDERAL) 10 MG tablet TAKE 1 TABLET TWICE DAILY  . rosuvastatin (CRESTOR) 10 MG tablet TAKE 1 TABLET EVERY DAY   No facility-administered encounter medications on file as of 02/21/2019.     Activities of Daily Living In your present state of health, do you have any difficulty performing the following activities: 02/21/2019  Hearing? N  Vision? N  Difficulty concentrating or making decisions? N  Walking or climbing stairs? N  Dressing or bathing? N  Doing errands, shopping? N  Preparing  Food and eating ? N  Using the Toilet? N  In the past six months, have you accidently leaked urine? N  Do you have problems with loss of bowel control? N  Managing your Medications? N  Managing your Finances? N  Housekeeping or managing your Housekeeping? N  Some recent data might be hidden    Patient Care Team: [name], MD as PCP - General (Family Medicine)    Assessment:   This is a  routine wellness examination for Amarri.  Exercise Activities and Dietary recommendations Current Exercise Habits: The patient does not participate in regular exercise at present, Exercise limited by: None identified  Goals    . Increase physical activity     Starting 02/15/2018, I will attempt to walk at 15-30 min 5 days per week.     . Patient Stated     02/21/2019, I will try to lose some weight and do more walking daily.       Fall Risk Fall Risk  02/21/2019 02/15/2018 01/24/2017  Falls in the past year? 0 No No  Number falls in past yr: 0 - -  Injury with Fall? 0 - -  Risk for fall due to : Medication side effect - -  Follow up Falls evaluation completed;Falls prevention discussed - -   Is the patient's home free of loose throw rugs in walkways, pet beds, electrical cords, etc?   yes      Grab bars in the bathroom? yes      Handrails on the stairs?   yes      Adequate lighting?   yes  Timed Get Up and Go performed: N/A  Depression Screen PHQ 2/9 Scores 02/21/2019 02/15/2018 01/24/2017  PHQ - 2 Score 0 0 0  PHQ- 9 Score 0 0 0     Cognitive Function MMSE - Mini Mental State Exam 02/21/2019 02/15/2018 01/24/2017  Orientation to time 5 5 5   Orientation to Place 5 5 5   Registration 3 3 3   Attention/ Calculation 5 0 0  Recall 3 3 3   Language- name 2 objects - 0 0  Language- repeat 1 1 1   Language- follow 3 step command - 3 3  Language- read & follow direction - 0 0  Write a sentence - 0 0  Copy design - 0 0  Total score - 20 20  Mini Cog  Mini-Cog screen was completed.  Maximum score is 22. A value of 0 denotes this part of the MMSE was not completed or the patient failed this part of the Mini-Cog screening.       Immunization History  Administered Date(s) Administered  . Fluad Quad(high Dose 65+) 02/21/2019  . Influenza Split 02/15/2011  . Influenza Whole 02/18/2013  . Influenza,inj,Quad PF,6+ Mos 05/29/2015, 02/02/2016, 01/27/2017, 02/15/2018  . Pneumococcal Conjugate-13 11/13/2013  . Pneumococcal Polysaccharide-23 04/18/2008, 08/31/2015    Qualifies for Shingles Vaccine? Yes  Screening Tests Health Maintenance  Topic Date Due  . MAMMOGRAM  10/18/2018  . OPHTHALMOLOGY EXAM  12/22/2018  . TETANUS/TDAP  04/18/2019 (Originally 03/03/2017)  . HEMOGLOBIN A1C  04/09/2019  . FOOT EXAM  05/26/2019  . COLONOSCOPY  01/20/2022  . INFLUENZA VACCINE  Completed  . DEXA SCAN  Completed  . Hepatitis C Screening  Completed  . PNA vac Low Risk Adult  Completed    Cancer Screenings:  Lung: Low Dose CT Chest recommended if Age 26-80 years, 30 pack-year currently smoking OR have quit w/in 15 years. Patient does not qualify. Breast:  Up to date on Mammogram? No, Patient will schedule appointment    Up to date of Bone Density/Dexa? Yes, completed 03/06/2017 Colorectal: completed 01/20/2017  Additional Screenings:  Hepatitis C Screening: 05/29/2015     Plan:    Patient will try to lose weight and walk more daily.    I have personally reviewed and noted the following in the patient's chart:   . Medical and social history . Use of alcohol, tobacco or illicit  drugs  . Current medications and supplements . Functional ability and status . Nutritional status . Physical activity . Advanced directives . List of other physicians . Hospitalizations, surgeries, and ER visits in previous 12 months . Vitals . Screenings to include cognitive, depression, and falls . Referrals and appointments  In addition, I have reviewed and discussed with patient certain  preventive protocols, quality metrics, and best practice recommendations. A written personalized care plan for preventive services as well as general preventive health recommendations were provided to patient.     Andrez Grime, LPN  [id number]

## 2019-02-25 ENCOUNTER — Other Ambulatory Visit: Payer: Medicare HMO

## 2019-02-28 ENCOUNTER — Ambulatory Visit (INDEPENDENT_AMBULATORY_CARE_PROVIDER_SITE_OTHER): Payer: Medicare HMO | Admitting: Family Medicine

## 2019-02-28 ENCOUNTER — Encounter: Payer: Self-pay | Admitting: Family Medicine

## 2019-02-28 DIAGNOSIS — E1140 Type 2 diabetes mellitus with diabetic neuropathy, unspecified: Secondary | ICD-10-CM | POA: Diagnosis not present

## 2019-02-28 DIAGNOSIS — R251 Tremor, unspecified: Secondary | ICD-10-CM

## 2019-02-28 DIAGNOSIS — Z7189 Other specified counseling: Secondary | ICD-10-CM

## 2019-02-28 DIAGNOSIS — I10 Essential (primary) hypertension: Secondary | ICD-10-CM

## 2019-02-28 DIAGNOSIS — E782 Mixed hyperlipidemia: Secondary | ICD-10-CM

## 2019-02-28 DIAGNOSIS — Z794 Long term (current) use of insulin: Secondary | ICD-10-CM

## 2019-02-28 MED ORDER — GABAPENTIN 400 MG PO CAPS: 400.0000 mg | ORAL_CAPSULE | Freq: Every day | ORAL | Status: DC

## 2019-02-28 NOTE — Progress Notes (Signed)
Interactive audio and video telecommunications were attempted between this provider and patient, however failed, due to patient having technical difficulties OR patient did not have access to video capability.  We continued and completed visit with audio only.   Virtual Visit via Telephone Note  I connected with patient on 02/28/19  at 10:25 AM  by telephone and verified that I am speaking with the correct person using two identifiers.  Location of patient: home  Location of MD: [place] Name of referring provider (if blank then none associated): Names per persons and role in encounter:  MD: Earlyne Iba, Patient: name listed above.    I discussed the limitations, risks, security and privacy concerns of performing an evaluation and management service by telephone and the availability of in person appointments. I also discussed with the patient that there may be a patient responsible charge related to this service. The patient expressed understanding and agreed to proceed.  CC: f/u  History of Present Illness:   Diabetes:  Using medications without difficulties: yes Hypoglycemic episodes:rarely down to 60s, cautions d/w pt.  She can feel it and correct it.   Hyperglycemic episodes:no Feet problems: foot pain improved on 400mg  gabapentin at night.   Blood Sugars averaging: usually 90-100 in the AMs.   eye exam within last year: d/w pt.  A1c not at goal but 1 point better, d/w pt.  She is working on diet and walking.   She is reading about the [employer] and considering that, discussed.   She is remodeling her home.    Hypertension:    Using medication without problems or lightheadedness: yes Chest pain with exertion:no Edema: occ at night, resolved by the next AM.   Short of breath: only with sig exertion, ie with notably hard work.    Tremor. At baseline, no change per patient report.  Still on beta blocker.    Elevated Cholesterol: Using medications without  problems: yes, she can tolerate crestor Muscle aches: no Diet compliance: yes Exercise:yes Labs d/w pt.    Advance care planning- husband designated if patient were incapacitated.   She'll call about mammogram and eye exam.    Observations/Objective: nad speech wnl  Assessment and Plan:  Diabetes:  A1c not at goal but 1 point better, d/w pt.  She is working on diet and walking.   She is reading about the [employer] and considering that, discussed.  Plan on recheck in about 3 months.  No change in meds at this point.  She may be able to make continued improvement in her A1c with diet and exercise.  Hypertension:    No change in meds at this point.  Labs discussed with patient.  She will update me as needed.  Tremor. At baseline, no change per patient report.  Still on beta blocker.  Continue as is.  Elevated Cholesterol: She can tolerate Crestor.  Continue as is.  Labs discussed with patient.  Advance care planning- husband designated if patient were incapacitated.   She'll call about mammogram and eye exam.    Follow Up Instructions: see above.    I discussed the assessment and treatment plan with the patient. The patient was provided an opportunity to ask questions and all were answered. The patient agreed with the plan and demonstrated an understanding of the instructions.   The patient was advised to call back or seek an in-person evaluation if the symptoms worsen or if the condition fails to improve as  anticipated.  I provided 25 minutes of non-face-to-face time during this encounter.  Elsie Stain, MD

## 2019-03-03 NOTE — Assessment & Plan Note (Signed)
Elevated Cholesterol: She can tolerate Crestor.  Continue as is.  Labs discussed with patient.

## 2019-03-03 NOTE — Assessment & Plan Note (Signed)
Advance care planning- husband designated if patient were incapacitated.

## 2019-03-03 NOTE — Assessment & Plan Note (Signed)
  Hypertension:    No change in meds at this point.  Labs discussed with patient.  She will update me as needed.

## 2019-03-03 NOTE — Assessment & Plan Note (Signed)
Tremor. At baseline, no change per patient report.  Still on beta blocker.  Continue as is.

## 2019-03-03 NOTE — Assessment & Plan Note (Signed)
Diabetes:  A1c not at goal but 1 point better, d/w pt.  She is working on diet and walking.   She is reading about the [employer] and considering that, discussed.  Plan on recheck in about 3 months.  No change in meds at this point.  She may be able to make continued improvement in her A1c with diet and exercise.

## 2019-05-26 ENCOUNTER — Other Ambulatory Visit: Payer: Self-pay | Admitting: Family Medicine

## 2019-05-26 DIAGNOSIS — Z794 Long term (current) use of insulin: Secondary | ICD-10-CM

## 2019-05-26 DIAGNOSIS — E1140 Type 2 diabetes mellitus with diabetic neuropathy, unspecified: Secondary | ICD-10-CM

## 2019-06-04 ENCOUNTER — Other Ambulatory Visit: Payer: Self-pay

## 2019-06-04 ENCOUNTER — Other Ambulatory Visit (INDEPENDENT_AMBULATORY_CARE_PROVIDER_SITE_OTHER): Payer: Medicare HMO

## 2019-06-04 DIAGNOSIS — Z794 Long term (current) use of insulin: Secondary | ICD-10-CM

## 2019-06-04 DIAGNOSIS — E1140 Type 2 diabetes mellitus with diabetic neuropathy, unspecified: Secondary | ICD-10-CM | POA: Diagnosis not present

## 2019-06-04 LAB — POCT GLYCOSYLATED HEMOGLOBIN (HGB A1C): Hemoglobin A1C: 8.7 % — AB (ref 4.0–5.6)

## 2019-06-06 ENCOUNTER — Telehealth: Payer: Self-pay

## 2019-06-06 NOTE — Telephone Encounter (Signed)
Tried to reach pt for lab results. No answer and VM not set up.  PCP msg concerning labs below.  [name], MD  06/06/2019 11:53 AM EST    Please call patient. A1c is slightly higher than previous. Please set up a phone visit so we can discuss. Thanks.

## 2019-06-14 ENCOUNTER — Other Ambulatory Visit: Payer: Self-pay

## 2019-06-14 ENCOUNTER — Ambulatory Visit (INDEPENDENT_AMBULATORY_CARE_PROVIDER_SITE_OTHER): Payer: Medicare HMO | Admitting: Family Medicine

## 2019-06-14 DIAGNOSIS — E1140 Type 2 diabetes mellitus with diabetic neuropathy, unspecified: Secondary | ICD-10-CM

## 2019-06-14 DIAGNOSIS — Z794 Long term (current) use of insulin: Secondary | ICD-10-CM | POA: Diagnosis not present

## 2019-06-14 NOTE — Progress Notes (Signed)
Interactive audio and video telecommunications were attempted between this provider and patient, however failed, due to patient having technical difficulties OR patient did not have access to video capability.  We continued and completed visit with audio only.   Virtual Visit via Telephone Note  I connected with patient on 06/14/19  at 10:03 AM  by telephone and verified that I am speaking with the correct person using two identifiers.  Location of patient: home   Location of MD: [place] Name of referring provider (if blank then none associated): Names per persons and role in encounter:  MD: Earlyne Iba, Patient: name listed above.    I discussed the limitations, risks, security and privacy concerns of performing an evaluation and management service by telephone and the availability of in person appointments. I also discussed with the patient that there may be a patient responsible charge related to this service. The patient expressed understanding and agreed to proceed.  CC: DM2 f/u.   History of Present Illness:   Diabetes:  Using medications without difficulties: yes Hypoglycemic episodes: no, rarely down to 80s.   Hyperglycemic episodes:no Feet problems: at baseline, still on gabapentin with some relief.   Blood Sugars averaging: usually lower 100s, ~130s eye exam within last year: due, d/w pt.   A1c d/w pt.  Similar to prev.   We talked about diet and exercise.  She is going to try to work on both.    covid vaccine done, 1st dose done 05/31/19, moderna Has f/u vaccine pending.    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  D/w pt about trigger avoidance.  No sx currently.  No lip or tongue swelling, no SOB.    Observations/Objective: nad Speech wnl  Assessment and Plan: Diabetes. A1c d/w pt.  Similar to prev.   We talked about diet and exercise.  She is going to try to work on both.   I want to check with pharmacy staff here and see  what is available for the patient that would not be cost prohibitive.  She is currently paying about $25 per vial for 70/30 insulin at [facility].  I want to see about options and then will be in touch with the patient.  She agrees.   Follow Up Instructions:see above.     I discussed the assessment and treatment plan with the patient. The patient was provided an opportunity to ask questions and all were answered. The patient agreed with the plan and demonstrated an understanding of the instructions.   The patient was advised to call back or seek an in-person evaluation if the symptoms worsen or if the condition fails to improve as anticipated.  I provided 19 minutes of non-face-to-face time during this encounter.  Elsie Stain, MD

## 2019-06-16 NOTE — Assessment & Plan Note (Signed)
A1c d/w pt.  Similar to prev.   We talked about diet and exercise.  She is going to try to work on both.   I want to check with pharmacy staff here and see what is available for the patient that would not be cost prohibitive.  She is currently paying about $25 per vial for 70/30 insulin at [facility].  I want to see about options and then will be in touch with the patient.  She agrees.

## 2019-06-27 ENCOUNTER — Other Ambulatory Visit: Payer: Self-pay | Admitting: Family Medicine

## 2019-06-27 DIAGNOSIS — E1140 Type 2 diabetes mellitus with diabetic neuropathy, unspecified: Secondary | ICD-10-CM

## 2019-06-27 DIAGNOSIS — Z794 Long term (current) use of insulin: Secondary | ICD-10-CM

## 2019-06-27 NOTE — Progress Notes (Signed)
Please talk to me about this referral.  Thanks.

## 2019-06-28 DIAGNOSIS — Z23 Encounter for immunization: Secondary | ICD-10-CM | POA: Diagnosis not present

## 2019-07-01 ENCOUNTER — Other Ambulatory Visit: Payer: Self-pay | Admitting: Family Medicine

## 2019-07-01 DIAGNOSIS — E1140 Type 2 diabetes mellitus with diabetic neuropathy, unspecified: Secondary | ICD-10-CM

## 2019-07-25 ENCOUNTER — Telehealth: Payer: Self-pay | Admitting: Family Medicine

## 2019-07-25 NOTE — Progress Notes (Signed)
°  Chronic Care Management   Outreach Note  07/25/2019 Name: Alexis [name] MRN: [id number] DOB: 1951-01-15  Referred by: [name], MD Reason for referral : No chief complaint on file.   An unsuccessful telephone outreach was attempted today. The patient was referred to the pharmacist for assistance with care management and care coordination.   Follow Up Plan:   Raynicia Dukes [employer] Scheduler

## 2019-08-02 ENCOUNTER — Telehealth: Payer: Self-pay | Admitting: Family Medicine

## 2019-08-02 NOTE — Progress Notes (Signed)
  Chronic Care Management   Outreach Note  08/02/2019 Name: KIREN CODAY MRN: [id number] DOB: 28-Feb-1951  Referred by: [name], MD Reason for referral : No chief complaint on file.   A second unsuccessful telephone outreach was attempted today. The patient was referred to pharmacist for assistance with care management and care coordination.  Follow Up Plan:   Raynicia Dukes [employer] Scheduler

## 2019-08-07 ENCOUNTER — Telehealth: Payer: Self-pay | Admitting: Family Medicine

## 2019-08-07 NOTE — Progress Notes (Signed)
  Chronic Care Management   Outreach Note  08/07/2019 Name: Alexis [name] MRN: [id number] DOB: June 21, 1950  Referred by: [name], MD Reason for referral : No chief complaint on file.   An unsuccessful telephone outreach was attempted today. The patient was referred to the pharmacist for assistance with care management and care coordination.   Follow Up Plan:   Raynicia Dukes [employer] Scheduler

## 2019-08-13 ENCOUNTER — Telehealth: Payer: Self-pay | Admitting: Family Medicine

## 2019-08-13 NOTE — Chronic Care Management (AMB) (Signed)
  Chronic Care Management   Note  08/13/2019 Name: Alexis [name] MRN: [id number] DOB: May 28, 1950  Alexis [name] is a 69 y.o. year old female who is a primary care patient of [name], MD. I reached out to [name] by phone today in response to a referral sent by Ms. Alexis [name] PCP, [name], MD.   Ms. Pontoriero was given information about Chronic Care Management services today including:  1. CCM service includes personalized support from designated clinical staff supervised by her physician, including individualized plan of care and coordination with other care providers 2. 24/7 contact phone numbers for assistance for urgent and routine care needs. 3. Service will only be billed when office clinical staff spend 20 minutes or more in a month to coordinate care. 4. Only one practitioner may furnish and bill the service in a calendar month. 5. The patient may stop CCM services at any time (effective at the end of the month) by phone call to the office staff.   Patient agreed to services and verbal consent obtained.    This note is not being shared with the patient for the following reason: To respect privacy (The patient or proxy has requested that the information not be shared).  Follow up plan:   Raynicia Dukes [employer] Scheduler

## 2019-08-22 ENCOUNTER — Encounter: Payer: Self-pay | Admitting: Emergency Medicine

## 2019-08-22 ENCOUNTER — Emergency Department: Payer: Medicare HMO

## 2019-08-22 ENCOUNTER — Other Ambulatory Visit: Payer: Self-pay

## 2019-08-22 ENCOUNTER — Emergency Department
Admission: EM | Admit: 2019-08-22 | Discharge: 2019-08-22 | Disposition: A | Discharge status: Home / Self Care | Payer: Medicare HMO | Attending: Emergency Medicine | Admitting: Emergency Medicine

## 2019-08-22 DIAGNOSIS — Y999 Unspecified external cause status: Secondary | ICD-10-CM | POA: Diagnosis not present

## 2019-08-22 DIAGNOSIS — S42294A Other nondisplaced fracture of upper end of right humerus, initial encounter for closed fracture: Secondary | ICD-10-CM | POA: Diagnosis not present

## 2019-08-22 DIAGNOSIS — S62617A Displaced fracture of proximal phalanx of left little finger, initial encounter for closed fracture: Secondary | ICD-10-CM | POA: Diagnosis not present

## 2019-08-22 DIAGNOSIS — Y92007 Garden or yard of unspecified non-institutional (private) residence as the place of occurrence of the external cause: Secondary | ICD-10-CM | POA: Diagnosis not present

## 2019-08-22 DIAGNOSIS — S6992XA Unspecified injury of left wrist, hand and finger(s), initial encounter: Secondary | ICD-10-CM | POA: Insufficient documentation

## 2019-08-22 DIAGNOSIS — S0083XA Contusion of other part of head, initial encounter: Secondary | ICD-10-CM | POA: Insufficient documentation

## 2019-08-22 DIAGNOSIS — Z794 Long term (current) use of insulin: Secondary | ICD-10-CM | POA: Insufficient documentation

## 2019-08-22 DIAGNOSIS — Y9389 Activity, other specified: Secondary | ICD-10-CM | POA: Diagnosis not present

## 2019-08-22 DIAGNOSIS — I10 Essential (primary) hypertension: Secondary | ICD-10-CM | POA: Diagnosis not present

## 2019-08-22 DIAGNOSIS — S199XXA Unspecified injury of neck, initial encounter: Secondary | ICD-10-CM | POA: Diagnosis not present

## 2019-08-22 DIAGNOSIS — W19XXXA Unspecified fall, initial encounter: Secondary | ICD-10-CM | POA: Diagnosis not present

## 2019-08-22 DIAGNOSIS — S42201A Unspecified fracture of upper end of right humerus, initial encounter for closed fracture: Secondary | ICD-10-CM

## 2019-08-22 DIAGNOSIS — W010XXA Fall on same level from slipping, tripping and stumbling without subsequent striking against object, initial encounter: Secondary | ICD-10-CM | POA: Insufficient documentation

## 2019-08-22 DIAGNOSIS — R52 Pain, unspecified: Secondary | ICD-10-CM | POA: Diagnosis not present

## 2019-08-22 DIAGNOSIS — E119 Type 2 diabetes mellitus without complications: Secondary | ICD-10-CM | POA: Insufficient documentation

## 2019-08-22 DIAGNOSIS — S62647A Nondisplaced fracture of proximal phalanx of left little finger, initial encounter for closed fracture: Secondary | ICD-10-CM | POA: Diagnosis not present

## 2019-08-22 DIAGNOSIS — S42291A Other displaced fracture of upper end of right humerus, initial encounter for closed fracture: Secondary | ICD-10-CM | POA: Diagnosis not present

## 2019-08-22 DIAGNOSIS — S59911A Unspecified injury of right forearm, initial encounter: Secondary | ICD-10-CM | POA: Diagnosis not present

## 2019-08-22 DIAGNOSIS — S4991XA Unspecified injury of right shoulder and upper arm, initial encounter: Secondary | ICD-10-CM | POA: Diagnosis present

## 2019-08-22 DIAGNOSIS — Z23 Encounter for immunization: Secondary | ICD-10-CM | POA: Insufficient documentation

## 2019-08-22 DIAGNOSIS — S0990XA Unspecified injury of head, initial encounter: Secondary | ICD-10-CM | POA: Diagnosis not present

## 2019-08-22 MED ORDER — OXYCODONE-ACETAMINOPHEN 5-325 MG PO TABS: 1.0000 | ORAL_TABLET | Freq: Four times a day (QID) | ORAL | 0 refills | Status: DC | PRN

## 2019-08-22 MED ORDER — OXYCODONE-ACETAMINOPHEN 5-325 MG PO TABS
1.0000 | ORAL_TABLET | Freq: Once | ORAL | Status: AC
  Administered 2019-08-22: 1 via ORAL
  Filled 2019-08-22: qty 1

## 2019-08-22 MED ORDER — TETANUS-DIPHTH-ACELL PERTUSSIS 5-2.5-18.5 LF-MCG/0.5 IM SUSP
0.5000 mL | Freq: Once | INTRAMUSCULAR | Status: AC
  Administered 2019-08-22: 19:00:00 0.5 mL via INTRAMUSCULAR
  Filled 2019-08-22: qty 0.5

## 2019-08-22 NOTE — ED Provider Notes (Signed)
[facility] Emergency Department Provider Note  ____________________________________________  Time seen: Approximately 6:40 PM  I have reviewed the triage vital signs and the nursing notes.   HISTORY  Chief Complaint Fall    HPI Alexis [name] is a 69 y.o. female with a history of GERD hypertension sciatica and chronic right knee pain due to arthritis who comes the ED after mechanical fall.  She was working in the yard, tripped and hit her face on the ground.  No loss of consciousness vision changes paresthesias or weakness.  She does report that her right arm got pinned behind her and the entire arm hurts.  Also notes a deformity to her left finger, denies any other acute pain complaints.  Denies neck pain.  Reports she was able to stand and bear weight afterward.      Past Medical History:  Diagnosis Date  . Allergy   . Asthma    in childhood  . Benign tumor of pelvic bones, sacrum, and coccyx   . Blood in stool   . Diabetes mellitus    dx'd at age ~72  . GERD (gastroesophageal reflux disease)   . Hx of colonic polyps   . Hyperlipidemia   . Hypertension    dx'd at age ~70  . Sleep apnea    on CPAP  . Tremor    only in face; worse under periods of stress. father with tremor also     Patient Active Problem List   Diagnosis Date Noted  . Rhinitis 08/22/2018  . Sciatica 05/27/2018  . Knee pain 05/27/2018  . Advance care planning 02/18/2018  . Aortic atherosclerosis ([place]) 08/04/2017  . Coronary artery calcification seen on CAT scan 08/04/2017  . Shortness of breath 08/04/2017  . Mixed hyperlipidemia 08/04/2017  . Exertional chest pain 08/02/2017  . Nosebleed 01/29/2017  . Health care maintenance 01/29/2017  . Cough 10/16/2016  . Dysphagia 09/29/2016  . Tremor 09/29/2016  . Postoperative anemia 12/30/2015  . FH: colon cancer 10/18/2011  . Meralgia paraesthetica 02/15/2011  . Type 2 diabetes mellitus with diabetic neuropathy, unspecified  ([place]) 11/11/2010  . Shoulder pain, left 09/21/2010  . Vaginitis 08/10/2010  . Diverticulosis 08/09/2010  . Pure hypercholesterolemia 07/27/2010  . Essential hypertension, benign 07/27/2010     Past Surgical History:  Procedure Laterality Date  . [place]   Surgery included removal of benign pelvic tumor  . COLONOSCOPY  01/20/2017  . LAPAROSCOPIC APPENDECTOMY N/A 10/30/2015   Procedure: APPENDECTOMY LAPAROSCOPIC, lysis of adhesions;  Surgeon: Hubbard Robinson, MD;  Location: [facility] ORS;  Service: General;  Laterality: N/A;  . OVARY BIOPSY  1988  . pelvic bone tumor  1988  . TONSILLECTOMY AND ADENOIDECTOMY  1957  . UPPER GI ENDOSCOPY  01/20/2017     Prior to Admission medications   Medication Sig Start Date End Date Taking? Authorizing Provider  ACCU-CHEK AVIVA PLUS test strip TEST BLOOD SUGAR TWICE DAILY  AND AS NEEDED AS DIRECTED 01/30/18   [name], MD  ACCU-CHEK SOFTCLIX LANCETS lancets USE AS INSTRUCTED TO CHECK BLOOD SUGAR TWICE DAILY AND AS NEEDED 01/30/18   [name], MD  aspirin EC 81 MG tablet Take 81 mg by mouth daily.    [provider]  DROPLET INSULIN SYRINGE 30G X 5/16" 0.5 ML MISC USE TO INJECT INSULIN TWICE DAILY AS DIRECTED 05/21/18   [name], MD  enalapril (VASOTEC) 20 MG tablet TAKE 1 TABLET (20 MG TOTAL) BY MOUTH  DAILY. 02/19/19   [name], MD  gabapentin (NEURONTIN) 400 MG capsule Take 1 capsule (400 mg total) by mouth at bedtime. 02/28/19   [name], MD  insulin NPH-regular Human (NOVOLIN 70/30) (70-30) 100 UNIT/ML injection 46 units in AM and 36 units in PM 12/04/17   [name], MD  loratadine (CLARITIN) 10 MG tablet Take 1 tablet (10 mg total) by mouth daily. 10/14/16   [name], MD  metFORMIN (GLUCOPHAGE) 1000 MG tablet TAKE 1 TABLET TWICE DAILY WITH A MEAL 02/19/19   [name], MD  omeprazole (PRILOSEC) 20 MG capsule Take 20 mg by mouth daily.    [provider]   oxyCODONE-acetaminophen (PERCOCET) 5-325 MG tablet Take 1 tablet by mouth every 6 (six) hours as needed for severe pain. 08/22/19 08/21/20  Carrie Mew, MD  Probiotic Product (ALIGN) 4 MG CAPS Take 1 capsule by mouth daily.    [provider]  propranolol (INDERAL) 10 MG tablet TAKE 1 TABLET TWICE DAILY 02/19/19   [name], MD  rosuvastatin (CRESTOR) 10 MG tablet TAKE 1 TABLET EVERY DAY 02/19/19   [name], MD     Allergies Milk-related compounds, Peanut-containing drug products, and Statins   Family History  Problem Relation Age of Onset  . Heart disease Mother        h/o valve replacement, no h/o MI  . Colon cancer Father   . Colon cancer Sister        metastatic (liver?) cancer  . Uterine cancer Sister   . Lung cancer Maternal Grandfather   . Breast cancer Neg Hx     Social History Social History   Tobacco Use  . Smoking status: Never Smoker  . Smokeless tobacco: Never Used  Substance Use Topics  . Alcohol use: No  . Drug use: No    Review of Systems  Constitutional:   No fever or chills.  ENT:   No sore throat. No rhinorrhea. Cardiovascular:   No chest pain or syncope. Respiratory:   No dyspnea or cough. Gastrointestinal:   Negative for abdominal pain, vomiting and diarrhea.  Musculoskeletal:   Right arm pain, left fifth finger pain. All other systems reviewed and are negative except as documented above in ROS and HPI.  ____________________________________________   PHYSICAL EXAM:  VITAL SIGNS: ED Triage Vitals  Enc Vitals Group     BP 08/22/19 1606 (!) 157/75     Pulse Rate 08/22/19 1606 92     Resp 08/22/19 1606 20     Temp 08/22/19 1606 98.7 F (37.1 C)     Temp Source 08/22/19 1606 Oral     SpO2 08/22/19 1606 95 %     Weight 08/22/19 1604 228 lb (103.4 kg)     Height 08/22/19 1604 5\' 4"  (1.626 m)     Head Circumference --      Peak Flow --      Pain Score 08/22/19 1604 10     Pain Loc --      Pain Edu? --      Excl.  in [place]? --     Vital signs reviewed, nursing assessments reviewed.   Constitutional:   Alert and oriented. Non-toxic appearance. Eyes:   Conjunctivae are normal. EOMI. PERRL. ENT      Head:   Normocephalic with abrasion and contusion to the bridge of the nose and mid forehead, no bony point tenderness, no battle sign or raccoon eyes, no rhinorrhea or otorrhea.Marland Kitchen  Nose:   Wearing a mask.      Mouth/Throat:   Wearing a mask.      Neck:   No meningismus. Full ROM.  No midline spinal tenderness Hematological/Lymphatic/Immunilogical:   No cervical lymphadenopathy. Cardiovascular:   RRR. Symmetric bilateral radial and DP pulses.  No murmurs. Cap refill less than 2 seconds. Respiratory:   Normal respiratory effort without tachypnea/retractions. Breath sounds are clear and equal bilaterally. No wheezes/rales/rhonchi. Gastrointestinal:   Soft and nontender. Non distended. There is no CVA tenderness.  No rebound, rigidity, or guarding. Musculoskeletal: Tenderness throughout the right arm humerus and forearm, most pronounced at the proximal humerus.  Neurovascularly intact.  Deformity and ecchymosis of the left proximal fifth finger. Neurologic:   Normal speech and language.  Motor grossly intact. No acute focal neurologic deficits are appreciated.  Skin:    Skin is warm, dry with facial abrasions as above.  No lacerations.  No rash noted.  No petechiae, purpura, or bullae.  ____________________________________________    LABS (pertinent positives/negatives) (all labs ordered are listed, but only abnormal results are displayed) Labs Reviewed - No data to display ____________________________________________   EKG    ____________________________________________    RADIOLOGY  DG Forearm Right  Result Date: 08/22/2019 CLINICAL DATA:  Tripped and fell, right arm pain EXAM: RIGHT FOREARM - 2 VIEW COMPARISON:  None. FINDINGS: Frontal and lateral views of the right forearm are obtained. No  fracture, subluxation, or dislocation. Alignment is anatomic. Mild osteoarthritis of the radiocarpal joint. Soft tissues are normal. IMPRESSION: 1. No acute displaced fracture. Electronically Signed   By: Randa Ngo M.D.   On: 08/22/2019 17:38   CT HEAD WO CONTRAST  Result Date: 08/22/2019 CLINICAL DATA:  Tripped and fell, right shoulder pain, nasal abrasion EXAM: CT HEAD WITHOUT CONTRAST TECHNIQUE: Contiguous axial images were obtained from the base of the skull through the vertex without intravenous contrast. COMPARISON:  None. FINDINGS: Brain: No acute infarct or hemorrhage. Focal hypodensities in the bilateral basal ganglia consistent with chronic lacunar infarcts. Lateral ventricles and remaining midline structures are unremarkable. No acute extra-axial fluid collections. No mass effect. Vascular: No hyperdense vessel or unexpected calcification. Skull: Normal. Negative for fracture or focal lesion. Sinuses/Orbits: No acute finding. Other: None. IMPRESSION: 1. No acute intracranial process. 2. Chronic lacunar infarcts bilateral basal ganglia. Electronically Signed   By: Randa Ngo M.D.   On: 08/22/2019 17:17   CT CERVICAL SPINE WO CONTRAST  Result Date: 08/22/2019 CLINICAL DATA:  Tripped and fell, right shoulder pain, nasal abrasion EXAM: CT CERVICAL SPINE WITHOUT CONTRAST TECHNIQUE: Multidetector CT imaging of the cervical spine was performed without intravenous contrast. Multiplanar CT image reconstructions were also generated. COMPARISON:  None. FINDINGS: Alignment: Alignment is grossly anatomic. Skull base and vertebrae: No acute displaced fracture. Soft tissues and spinal canal: No prevertebral fluid or swelling. No visible canal hematoma. Disc levels: There is multilevel cervical spondylosis, with disc space narrowing and osteophyte formation most pronounced at C4-5, C5-6, and C6-7. There is mild diffuse facet hypertrophy. Upper chest: Airway is patent.  Lung apices are clear. Other:  Reconstructed images demonstrate no additional findings. IMPRESSION: 1. Multilevel cervical spondylosis.  No acute fracture. Electronically Signed   By: Randa Ngo M.D.   On: 08/22/2019 17:19   DG Humerus Right  Result Date: 08/22/2019 CLINICAL DATA:  Tripped and fell, right shoulder pain EXAM: RIGHT HUMERUS - 2+ VIEW COMPARISON:  None. FINDINGS: Frontal and lateral views of the right humerus demonstrate a comminuted intra-articular proximal  right humeral head and neck fracture. No dislocation of the right shoulder. Distal right humerus is unremarkable. There is diffuse soft tissue edema. IMPRESSION: 1. Comminuted intra-articular fracture of the right humeral head and neck. Electronically Signed   By: Randa Ngo M.D.   On: 08/22/2019 17:38   DG Finger Little Left  Result Date: 08/22/2019 CLINICAL DATA:  [place], ecchymosis, left fifth digit deformity EXAM: LEFT LITTLE FINGER 2+V COMPARISON:  None. FINDINGS: Frontal, oblique, lateral views of the left fifth digit demonstrate a comminuted intra-articular fracture at the base of the fifth proximal phalanx. There is impaction and dorsal angulation at the fracture site. Osteoarthritis is seen within the fifth distal interphalangeal joint. There is diffuse soft tissue swelling. IMPRESSION: 1. Comminuted intra-articular fracture at the base of the fifth proximal phalanx. Electronically Signed   By: Randa Ngo M.D.   On: 08/22/2019 17:37    ____________________________________________   PROCEDURES .Ortho Injury Treatment  Date/Time: 08/22/2019 6:40 PM Performed by: Carrie Mew, MD Authorized by: Carrie Mew, MD   Consent:    Consent obtained:  Verbal   Consent given by:  Patient   Risks discussed:  Fracture, nerve damage and stiffness   Alternatives discussed:  Immobilization and referralInjury location: finger Location details: left little finger Injury type: fracture Fracture type: proximal phalanx MCP joint involved: yes IP  joint involved: no Pre-procedure neurovascular assessment: neurovascularly intact Pre-procedure distal perfusion: normal Pre-procedure neurological function: normal Pre-procedure range of motion: reduced  Anesthesia: Local anesthesia used: no  Patient sedated: NoManipulation performed: yes Skin traction used: no Skeletal traction used: yes Reduction successful: yes Immobilization: splint Splint type: static finger Supplies used: aluminum splint Post-procedure neurovascular assessment: post-procedure neurovascularly intact Post-procedure distal perfusion: normal Post-procedure neurological function: normal Post-procedure range of motion: unchanged Patient tolerance: patient tolerated the procedure well with no immediate complications Comments: Finger realigned with manipulation and splinted.     [facility] Injury Treatment  Date/Time: 08/22/2019 6:42 PM Performed by: Carrie Mew, MD Authorized by: Carrie Mew, MD   Consent:    Consent obtained:  Verbal   Consent given by:  Patient   Risks discussed:  Fracture, nerve damage, stiffness and restricted joint movement   Alternatives discussed:  ReferralInjury location: shoulder Location details: right shoulder Injury type: fracture Fracture type: surgical neck Pre-procedure neurovascular assessment: neurovascularly intact Pre-procedure distal perfusion: normal Pre-procedure neurological function: normal Pre-procedure range of motion: reduced  Anesthesia: Local anesthesia used: no  Patient sedated: NoManipulation performed: no Immobilization: sling Post-procedure neurovascular assessment: post-procedure neurovascularly intact Post-procedure distal perfusion: normal Post-procedure neurological function: normal Post-procedure range of motion: unchanged Patient tolerance: patient tolerated the procedure well with no immediate complications     ____________________________________________  DIFFERENTIAL  DIAGNOSIS   Intracranial hemorrhage, C-spine fracture, humerus fracture, forearm fracture, left finger fracture wrist dislocation  CLINICAL IMPRESSION / ASSESSMENT AND PLAN / ED COURSE  Medications ordered in the ED: Medications  Tdap (BOOSTRIX) injection 0.5 mL (has no administration in time range)  oxyCODONE-acetaminophen (PERCOCET/ROXICET) 5-325 MG per tablet 1 tablet (has no administration in time range)    Pertinent labs & imaging results that were available during my care of the patient were reviewed by me and considered in my medical decision making (see chart for details).  Alexis [name] was evaluated in Emergency Department on 08/22/2019 for the symptoms described in the history of present illness. She was evaluated in the context of the global COVID-19 pandemic, which necessitated consideration that the patient might be at risk for infection with  the SARS-CoV-2 virus that causes COVID-19. Institutional protocols and algorithms that pertain to the evaluation of patients at risk for COVID-19 are in a state of rapid change based on information released by regulatory bodies including the CDC and federal and state organizations. These policies and algorithms were followed during the patient's care in the ED.   Patient presents with pain of entire right arm as well as obvious deformity of left small finger after mechanical fall.  Also with facial contusions concerning for extension injury to the C-spine or intracranial hemorrhage.  Imaging obtained which confirms comminuted fracture of the surgical neck of the right proximal humerus.  Also comminuted fracture of left fifth finger proximal phalanx.  She is neurovascular intact without need for urgent surgical intervention.  Finger manipulated to realign, splinted.  Sling applied to right arm.  Prescription for Percocet sent to her [facility].  CT scans of head and neck are unremarkable.  Stable for discharge home.       ____________________________________________   FINAL CLINICAL IMPRESSION(S) / ED DIAGNOSES    Final diagnoses:  Traumatic closed nondisplaced fracture of proximal end of right humerus, initial encounter  Closed nondisplaced fracture of proximal phalanx of left little finger, initial encounter  Contusion of face, initial encounter     ED Discharge Orders         Ordered    oxyCODONE-acetaminophen (PERCOCET) 5-325 MG tablet  Every 6 hours PRN     08/22/19 1839          Portions of this note were generated with dragon dictation software. Dictation errors may occur despite best attempts at proofreading.   Carrie Mew, MD 08/22/19 [id number]

## 2019-08-22 NOTE — ED Triage Notes (Signed)
Pt presents to ED via [facility] with c/o mechanical fall, pt states tripped over piece of wood. Per EMS pt c/o R shoulder pain, L pinky pain, pt with abrasion and bruising to nose upon arrival.    92HR 95% RA 180/90

## 2019-08-22 NOTE — ED Notes (Signed)
Pt transported to radiology at this time °

## 2019-08-22 NOTE — ED Notes (Signed)
ED Provider at bedside. 

## 2019-08-22 NOTE — ED Notes (Signed)
This RN to bedside, pt visualized in NAD. Pt resting in bed with husband at bedside. Apologized and explained delay. Pt states understanding.

## 2019-08-26 DIAGNOSIS — S42211A Unspecified displaced fracture of surgical neck of right humerus, initial encounter for closed fracture: Secondary | ICD-10-CM | POA: Diagnosis not present

## 2019-08-26 DIAGNOSIS — S62611A Displaced fracture of proximal phalanx of left index finger, initial encounter for closed fracture: Secondary | ICD-10-CM | POA: Diagnosis not present

## 2019-08-26 DIAGNOSIS — S62619A Displaced fracture of proximal phalanx of unspecified finger, initial encounter for closed fracture: Secondary | ICD-10-CM | POA: Insufficient documentation

## 2019-08-26 DIAGNOSIS — S42209A Unspecified fracture of upper end of unspecified humerus, initial encounter for closed fracture: Secondary | ICD-10-CM | POA: Insufficient documentation

## 2019-08-27 DIAGNOSIS — S62619A Displaced fracture of proximal phalanx of unspecified finger, initial encounter for closed fracture: Secondary | ICD-10-CM | POA: Diagnosis not present

## 2019-08-28 DIAGNOSIS — E1140 Type 2 diabetes mellitus with diabetic neuropathy, unspecified: Secondary | ICD-10-CM | POA: Diagnosis not present

## 2019-08-28 DIAGNOSIS — Z794 Long term (current) use of insulin: Secondary | ICD-10-CM | POA: Diagnosis not present

## 2019-08-28 DIAGNOSIS — Z79891 Long term (current) use of opiate analgesic: Secondary | ICD-10-CM | POA: Diagnosis not present

## 2019-08-28 DIAGNOSIS — G4733 Obstructive sleep apnea (adult) (pediatric): Secondary | ICD-10-CM | POA: Diagnosis not present

## 2019-08-28 DIAGNOSIS — Z7982 Long term (current) use of aspirin: Secondary | ICD-10-CM | POA: Diagnosis not present

## 2019-08-28 DIAGNOSIS — I10 Essential (primary) hypertension: Secondary | ICD-10-CM | POA: Diagnosis not present

## 2019-08-28 DIAGNOSIS — K219 Gastro-esophageal reflux disease without esophagitis: Secondary | ICD-10-CM | POA: Diagnosis not present

## 2019-08-28 DIAGNOSIS — Z20822 Contact with and (suspected) exposure to covid-19: Secondary | ICD-10-CM | POA: Diagnosis not present

## 2019-08-28 DIAGNOSIS — S62617A Displaced fracture of proximal phalanx of left little finger, initial encounter for closed fracture: Secondary | ICD-10-CM | POA: Diagnosis not present

## 2019-09-03 DIAGNOSIS — S62619D Displaced fracture of proximal phalanx of unspecified finger, subsequent encounter for fracture with routine healing: Secondary | ICD-10-CM | POA: Diagnosis not present

## 2019-09-03 DIAGNOSIS — S62617D Displaced fracture of proximal phalanx of left little finger, subsequent encounter for fracture with routine healing: Secondary | ICD-10-CM | POA: Diagnosis not present

## 2019-09-10 DIAGNOSIS — S62619D Displaced fracture of proximal phalanx of unspecified finger, subsequent encounter for fracture with routine healing: Secondary | ICD-10-CM | POA: Diagnosis not present

## 2019-09-10 DIAGNOSIS — S42211A Unspecified displaced fracture of surgical neck of right humerus, initial encounter for closed fracture: Secondary | ICD-10-CM | POA: Diagnosis not present

## 2019-09-19 ENCOUNTER — Other Ambulatory Visit: Payer: Self-pay

## 2019-09-19 ENCOUNTER — Ambulatory Visit: Payer: Medicare HMO

## 2019-09-19 DIAGNOSIS — I10 Essential (primary) hypertension: Secondary | ICD-10-CM

## 2019-09-19 DIAGNOSIS — S62619D Displaced fracture of proximal phalanx of unspecified finger, subsequent encounter for fracture with routine healing: Secondary | ICD-10-CM | POA: Diagnosis not present

## 2019-09-19 DIAGNOSIS — E1140 Type 2 diabetes mellitus with diabetic neuropathy, unspecified: Secondary | ICD-10-CM

## 2019-09-19 NOTE — Chronic Care Management (AMB) (Signed)
Chronic Care Management Pharmacy  Name: Alexis [name]  MRN: [id number] DOB: 04-16-1951  Chief Complaint/ HPI  Alexis [name],  69 y.o. , female presents for their Initial CCM visit with the clinical pharmacist via telephone.  PCP : Alexis Ghent, MD   Their chronic conditions include: HTN, diverticulosis, type 2 DM, hypercholesterolemia, tremor, sciatica, CAD  Patient concerns: denies medication concerns  Office Visits:  06/14/19: Alexis [name] - virtual visit, DM, check with pharmacy staff for additional options due to cost, paying $25 per vial 70/30 insulin at [facility]   02/28/19: Alexis [name] - virtual visit, DM, A1c improving, above goal, working on diet/walking, rtc 3 months  Consult Visit:  01/14/19: Cardiology - uncontrolled DM, CT with coronary calcification, arotic atherosclerosis, aggressive DM control recommended, chest pain with exertion, CAD stable angina, no med changes   ED: 08/22/19 - fall, working in yard, tripped and hit face on ground, right arm pain, left fifth finger pain - rx oxycodone/APAP, Tdap given, fracture of right proximal humerus   Allergies  Allergen Reactions  . Milk-Related Compounds     Rash/rhinorrhea.   . Peanut-Containing Drug Products     rash  . Statins Other (See Comments)    Myalgia- but able to tolerate crestor 10mg    Medications: Outpatient Encounter Medications as of 09/19/2019  Medication Sig  . ACCU-CHEK AVIVA PLUS test strip TEST BLOOD SUGAR TWICE DAILY  AND AS NEEDED AS DIRECTED  . ACCU-CHEK SOFTCLIX LANCETS lancets USE AS INSTRUCTED TO CHECK BLOOD SUGAR TWICE DAILY AND AS NEEDED  . aspirin EC 81 MG tablet Take 81 mg by mouth daily.  . DROPLET INSULIN SYRINGE 30G X 5/16" 0.5 ML MISC USE TO INJECT INSULIN TWICE DAILY AS DIRECTED  . enalapril (VASOTEC) 20 MG tablet TAKE 1 TABLET (20 MG TOTAL) BY MOUTH DAILY.  Marland Kitchen gabapentin (NEURONTIN) 400 MG capsule Take 1 capsule (400 mg total) by mouth at bedtime.  . insulin NPH-regular Human (NOVOLIN  70/30) (70-30) 100 UNIT/ML injection 46 units in AM and 36 units in PM  . loratadine (CLARITIN) 10 MG tablet Take 1 tablet (10 mg total) by mouth daily.  . metFORMIN (GLUCOPHAGE) 1000 MG tablet TAKE 1 TABLET TWICE DAILY WITH A MEAL  . omeprazole (PRILOSEC) 20 MG capsule Take 20 mg by mouth daily.  Marland Kitchen oxyCODONE-acetaminophen (PERCOCET) 5-325 MG tablet Take 1 tablet by mouth every 6 (six) hours as needed for severe pain.  . Probiotic Product (ALIGN) 4 MG CAPS Take 1 capsule by mouth daily.  . propranolol (INDERAL) 10 MG tablet TAKE 1 TABLET TWICE DAILY  . rosuvastatin (CRESTOR) 10 MG tablet TAKE 1 TABLET EVERY DAY   No facility-administered encounter medications on file as of 09/19/2019.   Current Diagnosis/Assessment:   Emergency planning/management officer Strain: Medium Risk  . Difficulty of Paying Living Expenses: Somewhat hard   Goals Addressed            This Visit's Progress   . Pharmacy Care [name]       CARE [name] ENTRY  Current Barriers:  . Chronic Disease Management support, education, and care coordination needs related to Hypertension and Diabetes   Hypertension . Pharmacist Clinical Goal(s): o Over the next 30 days days, patient will work with PharmD and providers to achieve BP goal <140/90 mmHg Office blood pressures are: BP Readings from Last 3 Encounters:  08/22/19 (!) 147/88  01/14/19 (!) 154/70  05/25/18 (!) 156/90 .  Current regimen:   Enalapril 20 mg - 1 tablet daily  Propranolol 10 mg - 1 tablet twice daily  . Interventions: o Reviewed adherence, patient is taking medications as prescribed o Recommend home monitoring for the next several weeks due to elevated clinic readings . Patient self care activities - Over the next 30 days, patient will: o Check blood pressure 2-3 days per week at the same time each day (before breakfast is best), document, and provide at future appointment with pharmacist o Ensure daily salt intake < 2300 mg/day  Diabetes . Pharmacist Clinical  Goal(s): o Over the next 30 days, patient will work with PharmD and providers to achieve A1c goal <7% and avoid hypoglycemia  . Current regimen:   Metformin 1000 mg - 1 tablet twice daily with meal  Insulin NPH - Inject 46 units every morning and 36 units every evening before meals . Interventions o Discussed high protein, low carbohydrate snack at bedtime to reduce overnight hypoglycemia, such as hard boiled egg, handful of nuts, plain yogurt, low fat cheese . Patient self care activities - Over the next 30 days, patient will: o Check blood sugar daily, before breakfast, document, and provide at future appointment with pharmacist; Check again with any symptoms of hypoglycemia o Contact provider with any episodes of hypoglycemia o Reduce evening dose of Insulin NPH to 30 units o Try high protein snack options before bed, such as hard boiled egg, handful of nuts, plain yogurt, low fat cheese o Remain up to date on annual foot exam   Initial goal documentation      Hypertension   CMP Latest Ref Rng & Units 02/21/2019 02/15/2018 11/30/2017  Glucose 70 - 99 mg/dL 227(H) 77 176(H)  BUN 6 - 23 mg/dL 19 16 16   Creatinine 0.40 - 1.20 mg/dL 0.78 0.78 0.86  Sodium 135 - 145 mEq/L 139 140 140  Potassium 3.5 - 5.1 mEq/L 4.6 4.6 4.8  Chloride 96 - 112 mEq/L 102 102 100  CO2 19 - 32 mEq/L 30 35(H) 33(H)  Calcium 8.4 - 10.5 mg/dL 9.7 10.1 10.3  Total Protein 6.0 - 8.3 g/dL 7.4 7.9 -  Total Bilirubin 0.2 - 1.2 mg/dL 0.3 0.4 -  Alkaline Phos 39 - 117 U/L 80 95 -  AST 0 - 37 U/L 14 12 -  ALT 0 - 35 U/L 10 8 -   Office blood pressures are: BP Readings from Last 3 Encounters:  08/22/19 (!) 147/88  01/14/19 (!) 154/70  05/25/18 (!) 156/90   Patient has failed these meds in the past: none  Patient checks BP at home infrequently (has home monitor - arm cuff) Patient home BP readings are ranging: none reported  BP goal < 140/90 mmHg Patient is currently uncontrolled on the following  medications:   Enalapril 20 mg - 1 tablet daily  Propranolol 10 mg - 1 tablet BID (for tremor)  We discussed: refills timely, confirms adherence  [name]: Continue current medications; Begin checking BP at home 2-3 days per week.   Hyperlipidemia/CAD   Lipid Panel     Component Value Date/Time   CHOL 133 02/21/2019 1024   TRIG 223.0 (H) 02/21/2019 1024   HDL 25.90 (L) 02/21/2019 1024   LDLCALC 69 11/15/2017 0734   LDLDIRECT 78.0 02/21/2019 1024    The 10-year ASCVD risk score [name], et al., 2013) is: 27.5%   Values used to calculate the score:     Age: 30 years     Sex: Female     Is Non-Hispanic African American: No  Diabetic: Yes     Tobacco smoker: No     Systolic Blood Pressure: [id number] mmHg     Is BP treated: Yes     HDL Cholesterol: 25.9 mg/dL     Total Cholesterol: 133 mg/dL   LDL goal < 70 (CAD), HDL > 50, TG < 150 Patient has failed these meds in past: myalgias with other statins Patient is currently uncontrolled on the following medications:  . Aspirin 81 mg - 1 tablet daily . Rosuvastatin 10 mg - 1 tablet daily  Adherence: Refills timely; denies adverse effects We discussed:  LDL slightly above goal on current therapy; could improve HDL and reduce TG, work on diabetes control; on max tolerated statin    [name]: Continue current medications; Improve diet choices and DM control.  Diabetes   Recent Relevant Labs: Lab Results  Component Value Date/Time   HGBA1C 8.7 (A) 06/04/2019 10:05 AM   HGBA1C 8.4 (H) 02/21/2019 10:24 AM   HGBA1C 9.5 (A) 10/08/2018 09:19 AM   HGBA1C 8.7 (H) 02/15/2018 12:13 PM    Checking BG: Daily (fasting) Recent FBG Readings: 60-110  Hypoglycemia: reports 6-7 overnight lows in the past month since fall (wakes up with symptoms -blurred vision, sweating); eating snack before bed (honey nut cheerios - cup of cereal with milk) Patient has failed these meds in past: none reported  A1c goal < 7% Patient is currently uncontrolled on  the following medications:   Metformin 1000 mg - 1 tablet twice daily with meal  Insulin NPH - 46 AM, 36 PM before meals  Last diabetic eye exam:  Lab Results  Component Value Date/Time   HMDIABEYEEXA No Retinopathy 12/21/2017 12:00 AM    Last diabetic foot exam: 05/25/18 - DUE for annual   Adherence: refills metformin timely; denies adverse effects with metformin, confirms correct insulin dosing  Diet: reports appetite has decreased since fall/hand surgery Counseling: counseled on healthy, high protein snacks at bedtime, appropriate treatment of hypoglycemia  Assessment: recommend reducing insulin due to hypoglycemia - Consult PCP; reduced appetite since fall may by contributing, will need to continue to monitor closely   [name]: Continue metformin as prescribed. Consult with PCP to reduce evening dose of insulin to 30 units due to hypoglycemia. Follow up in 2 weeks. See PCP in office next visit for foot exam.  Chronic Pain   Patient has failed these meds in past: none  Patient is currently controlled on the following medications:   Gabapentin 400 mg - 1 capsule qhs   Tylenol 500 mg - 1 tablet PRN qhs (OTC)  Not taking:  Oxycodone/acetaminophen 5-325 mg - 1 tablet q6h PRN pain (short term for hand surgery); Oxycodone 5 mg IR  Ibuprofen 600 mg   Zofran   We discussed: reports gabapentin works well, denies unresolved pain  [name]: Continue current medications  Medication Management  Misc: omeprazole 20 mg - 1 capsule daily (OTC) - helps with acid reflux   OTCs: loratadine 10 mg daily PRN, probiotic Align 4 mg daily (not daily)  Pharmacy/Benefits: [facility]/Mail order (synced for maintenance meds) + [facility] (acute meds)  Affordability: no concerns, all generic/no copay; [facility] Insulin $25 vials - 2 vials/month   CCM Follow Up: 2 weeks (telephone)   [name], PharmD Clinical Pharmacist [facility] [phone]

## 2019-09-24 DIAGNOSIS — S42301D Unspecified fracture of shaft of humerus, right arm, subsequent encounter for fracture with routine healing: Secondary | ICD-10-CM | POA: Diagnosis not present

## 2019-09-26 DIAGNOSIS — S62619D Displaced fracture of proximal phalanx of unspecified finger, subsequent encounter for fracture with routine healing: Secondary | ICD-10-CM | POA: Diagnosis not present

## 2019-09-27 DIAGNOSIS — S62619A Displaced fracture of proximal phalanx of unspecified finger, initial encounter for closed fracture: Secondary | ICD-10-CM | POA: Diagnosis not present

## 2019-09-27 DIAGNOSIS — S42201A Unspecified fracture of upper end of right humerus, initial encounter for closed fracture: Secondary | ICD-10-CM | POA: Diagnosis not present

## 2019-09-30 ENCOUNTER — Telehealth: Payer: Self-pay

## 2019-09-30 NOTE — Patient Instructions (Addendum)
Dear Alexis [name],  It was a pleasure meeting you during our initial appointment on September 19, 2019. Below is a summary of the goals we discussed and components of chronic care management. Please contact me anytime with questions or concerns.   Visit Information  Goals Addressed            This Visit's Progress   . Pharmacy Care [name]       CARE [name] ENTRY  Current Barriers:  . Chronic Disease Management support, education, and care coordination needs related to Hypertension and Diabetes   Hypertension . Pharmacist Clinical Goal(s): o Over the next 30 days days, patient will work with PharmD and providers to achieve BP goal <140/90 mmHg Office blood pressures are: BP Readings from Last 3 Encounters:  08/22/19 (!) 147/88  01/14/19 (!) 154/70  05/25/18 (!) 156/90 .  Current regimen:   Enalapril 20 mg - 1 tablet daily  Propranolol 10 mg - 1 tablet twice daily  . Interventions: o Reviewed adherence, patient is taking medications as prescribed o Recommend home monitoring for the next several weeks due to elevated clinic readings . Patient self care activities - Over the next 30 days, patient will: o Check blood pressure 2-3 days per week at the same time each day (before breakfast is best), document, and provide at future appointment with pharmacist o Ensure daily salt intake < 2300 mg/day  Diabetes . Pharmacist Clinical Goal(s): o Over the next 30 days, patient will work with PharmD and providers to achieve A1c goal <7% and avoid hypoglycemia  . Current regimen:   Metformin 1000 mg - 1 tablet twice daily with meal  Insulin NPH - Inject 46 units every morning and 36 units every evening before meals . Interventions o Discussed high protein, low carbohydrate snack at bedtime to reduce overnight hypoglycemia, such as hard boiled egg, handful of nuts, plain yogurt, low fat cheese . Patient self care activities - Over the next 30 days, patient will: o Check blood sugar daily,  before breakfast, document, and provide at future appointment with pharmacist; Check again with any symptoms of hypoglycemia o Contact provider with any episodes of hypoglycemia o Reduce evening dose of Insulin NPH to 30 units o Try high protein snack options before bed, such as hard boiled egg, handful of nuts, plain yogurt, low fat cheese o Remain up to date on annual foot exam   Initial goal documentation      Alexis [name] was given information about Chronic Care Management services today including:  1. CCM service includes personalized support from designated clinical staff supervised by her physician, including individualized [name] of care and coordination with other care providers 2. 24/7 contact phone numbers for assistance for urgent and routine care needs. 3. Standard insurance, coinsurance, copays and deductibles apply for chronic care management only during months in which we provide at least 20 minutes of these services. Most insurances cover these services at 100%, however patients may be responsible for any copay, coinsurance and/or deductible if applicable. This service may help you avoid the need for more expensive face-to-face services. 4. Only one practitioner may furnish and bill the service in a calendar month. 5. The patient may stop CCM services at any time (effective at the end of the month) by phone call to the office staff.  Patient agreed to services and verbal consent obtained.   The patient verbalized understanding of instructions provided today and agreed to receive a mailed copy of patient instruction and/or  Scientist, clinical (histocompatibility and immunogenetics). Telephone follow up appointment with pharmacy team member scheduled for: 10/01/19 at 12:00 PM (telephone)  [name], PharmD Clinical Pharmacist [facility] [phone]   Preventing Hypoglycemia Hypoglycemia occurs when the level of sugar (glucose) in the blood is too low. Hypoglycemia can happen in  people who do or do not have diabetes (diabetes mellitus). It can develop quickly, and it can be a medical emergency. For most people with diabetes, a blood glucose level below 70 mg/dL (3.9 mmol/L) is considered hypoglycemia. Glucose is a type of sugar that provides the body's main source of energy. Certain hormones (insulin and glucagon) control the level of glucose in the blood. Insulin lowers blood glucose, and glucagon increases blood glucose. Hypoglycemia can result from having too much insulin in the bloodstream, or from not eating enough food that contains glucose. Your risk for hypoglycemia is higher:  If you take insulin or diabetes medicines to help lower your blood glucose or help your body make more insulin.  If you skip or delay a meal or snack.  If you are ill.  During and after exercise. You can prevent hypoglycemia by working with your health care provider to adjust your meal [name] as needed and by taking other precautions. How can hypoglycemia affect me? Mild symptoms Mild hypoglycemia may not cause any symptoms. If you do have symptoms, they may include:  Hunger.  Anxiety.  Sweating and feeling clammy.  Dizziness or feeling light-headed.  Sleepiness.  Nausea.  Increased heart rate.  Headache.  Blurry vision.  Irritability.  Tingling or numbness around the mouth, lips, or tongue.  A change in coordination.  Restless sleep. If mild hypoglycemia is not recognized and treated, it can quickly become moderate or severe hypoglycemia. Moderate symptoms Moderate hypoglycemia can cause:  Mental confusion and poor judgment.  Behavior changes.  Weakness.  Irregular heartbeat. Severe symptoms Severe hypoglycemia is a medical emergency. It can cause:  Fainting.  Seizures.  Loss of consciousness (coma).  Death. What nutrition changes can be made?  Work with your health care provider or diet and nutrition specialist (dietitian) to make a healthy meal  [name] that is right for you. Follow your meal [name] carefully.  Eat meals at regular times.  If recommended by your health care provider, have snacks between meals.  Donot skip or delay meals or snacks. You can be at risk for hypoglycemia if you are not getting enough carbohydrates. What lifestyle changes can be made?   Work closely with your health care provider to manage your blood glucose. Make sure you know: ? Your goal blood glucose levels. ? How and when to check your blood glucose. ? The symptoms of hypoglycemia. It is important to treat it right away to keep it from becoming severe.  Do not drink alcohol on an empty stomach.  When you are ill, check your blood glucose more often than usual. Follow your sick day [name] whenever you cannot eat or drink normally. Make this [name] in advance with your health care provider.  Always check your blood glucose before, during, and after exercise. How is this treated? This condition can often be treated by immediately eating or drinking something that contains sugar, such as:  Fruit juice, 4-6 oz (120-150 mL).  Regular (not diet) soda, 4-6 oz (120-150 mL).  Low-fat milk, 4 oz (120 mL).  Several pieces of hard candy.  Sugar or honey, 1 Tbsp (15 mL). Treating hypoglycemia if you have diabetes If you are  alert and able to swallow safely, follow the 15:15 rule:  Take 15 grams of a rapid-acting carbohydrate. Talk with your health care provider about how much you should take.  Rapid-acting options include: ? Glucose pills (take 15 grams). ? 6-8 pieces of hard candy. ? 4-6 oz (120-150 mL) of fruit juice. ? 4-6 oz (120-150 mL) of regular (not diet) soda.  Check your blood glucose 15 minutes after you take the carbohydrate.  If the repeat blood glucose level is still at or below 70 mg/dL (3.9 mmol/L), take 15 grams of a carbohydrate again.  If your blood glucose level does not increase above 70 mg/dL (3.9 mmol/L) after 3 tries, seek  emergency medical care.  After your blood glucose level returns to normal, eat a meal or a snack within 1 hour. Treating severe hypoglycemia Severe hypoglycemia is when your blood glucose level is at or below 54 mg/dL (3 mmol/L). Severe hypoglycemia is a medical emergency. Get medical help right away. If you have severe hypoglycemia and you cannot eat or drink, you may need an injection of glucagon. A family member or close friend should learn how to check your blood glucose and how to give you a glucagon injection. Ask your health care provider if you need to have an emergency glucagon injection kit available. Severe hypoglycemia may need to be treated in a hospital. The treatment may include getting glucose through an IV. You may also need treatment for the cause of your hypoglycemia. Where to find more information  American Diabetes Association: www.diabetes.[employer] of Diabetes and Digestive and Kidney Diseases: [employer] Contact a health care provider if:  You have problems keeping your blood glucose in your target range.  You have frequent episodes of hypoglycemia. Get help right away if:  You continue to have hypoglycemia symptoms after eating or drinking something containing glucose.  Your blood glucose level is at or below 54 mg/dL (3 mmol/L).  You faint.  You have a seizure. These symptoms may represent a serious problem that is an emergency. Do not wait to see if the symptoms will go away. Get medical help right away. Call your local emergency services (911 in the [place] Summary  Know the symptoms of hypoglycemia, and when you are at risk for it (such as during exercise or when you are sick). Check your blood glucose often when you are at risk for hypoglycemia.  Hypoglycemia can develop quickly, and it can be dangerous if it is not treated right away. If you have a history of severe hypoglycemia, make sure you know how to use your glucagon injection  kit.  Make sure you know how to treat hypoglycemia. Keep a carbohydrate snack available when you may be at risk for hypoglycemia. This information is not intended to replace advice given to you by your health care provider. Make sure you discuss any questions you have with your health care provider. Document Revised: 07/27/2018 Document Reviewed: 11/30/2016 Elsevier Patient Education  [place].

## 2019-10-01 ENCOUNTER — Other Ambulatory Visit: Payer: Self-pay

## 2019-10-01 ENCOUNTER — Ambulatory Visit: Payer: Medicare HMO

## 2019-10-01 DIAGNOSIS — I10 Essential (primary) hypertension: Secondary | ICD-10-CM

## 2019-10-01 DIAGNOSIS — S62617D Displaced fracture of proximal phalanx of left little finger, subsequent encounter for fracture with routine healing: Secondary | ICD-10-CM | POA: Diagnosis not present

## 2019-10-01 DIAGNOSIS — S62619D Displaced fracture of proximal phalanx of unspecified finger, subsequent encounter for fracture with routine healing: Secondary | ICD-10-CM | POA: Diagnosis not present

## 2019-10-01 DIAGNOSIS — Z794 Long term (current) use of insulin: Secondary | ICD-10-CM

## 2019-10-03 DIAGNOSIS — S42301D Unspecified fracture of shaft of humerus, right arm, subsequent encounter for fracture with routine healing: Secondary | ICD-10-CM | POA: Diagnosis not present

## 2019-10-04 DIAGNOSIS — S42301D Unspecified fracture of shaft of humerus, right arm, subsequent encounter for fracture with routine healing: Secondary | ICD-10-CM | POA: Diagnosis not present

## 2019-10-05 NOTE — Patient Instructions (Addendum)
Dear Alexis [name],  Below is a summary of the goals we discussed during our follow up appointment on October 01, 2019. Please contact me anytime with questions or concerns.   Visit Information  Goals Addressed            This Visit's Progress   . Pharmacy Care [name]       CARE [name] ENTRY  Current Barriers:  . Chronic Disease Management support, education, and care coordination needs related to Hypertension and Diabetes   Hypertension . Pharmacist Clinical Goal(s): o Over the next 30 days days, patient will work with PharmD and providers to achieve BP goal <140/90 mmHg Office blood pressures are: BP Readings from Last 3 Encounters:  08/22/19 (!) 147/88  01/14/19 (!) 154/70  05/25/18 (!) 156/90 .  Recent Home BP readings: 120/74, 134/70 . Current regimen:   Enalapril 20 mg - 1 tablet daily  Propranolol 10 mg - 1 tablet twice daily  . Interventions: o Reviewed adherence, patient is taking medications as prescribed o Recommend continuing home monitoring for the next 4 weeks if home monitor is working  . Patient self care activities - Over the next 30 days, patient will: o Check blood pressure 2-3 days per week at the same time each day (before breakfast is best), document, and provide at future appointment with pharmacist o Ensure daily salt intake < 2300 mg/day  Diabetes . Pharmacist Clinical Goal(s): o Over the next 30 days, patient will work with PharmD and providers to achieve A1c goal <7% and avoid hypoglycemia  . Current regimen:   Metformin 1000 mg - 1 tablet twice daily with meal  Insulin NPH - Inject 46 units every morning and 30 units every evening before meals . Interventions o Recommend high protein snack at bedtime to reduce overnight hypoglycemia, such as hard boiled egg, handful of nuts, plain yogurt, low fat cheese; Carbohydrate snacks should be limited to 15 grams of carbs.  . Patient self care activities - Over the next 30 days, patient will: o Check  blood sugar daily, before breakfast for 2 weeks. If BG is within 80-130, begin checking 2 hours after evening meal instead for 2 weeks. Check again with any symptoms of hypoglycemia o Contact pharmacist with any episodes of hypoglycemia o Try high protein snack options before bed, such as hard boiled egg, handful of nuts, plain yogurt, low fat cheese o Schedule annual foot exam   Please see past updates related to this goal by clicking on the "Past Updates" button in the selected goal       The patient verbalized understanding of instructions provided today and agreed to receive a mailed copy of patient instruction and/or educational materials.  Telephone follow up appointment with pharmacy team member scheduled for:  11/05/19 at 1:00 PM (telephone)  [name], PharmD Clinical Pharmacist [facility] [phone]  Preventing Hypoglycemia Hypoglycemia occurs when the level of sugar (glucose) in the blood is too low. Hypoglycemia can happen in people who do or do not have diabetes (diabetes mellitus). It can develop quickly, and it can be a medical emergency. For most people with diabetes, a blood glucose level below 70 mg/dL (3.9 mmol/L) is considered hypoglycemia. Glucose is a type of sugar that provides the body's main source of energy. Certain hormones (insulin and glucagon) control the level of glucose in the blood. Insulin lowers blood glucose, and glucagon increases blood glucose. Hypoglycemia can result from having too much insulin in the bloodstream,  or from not eating enough food that contains glucose. Your risk for hypoglycemia is higher:  If you take insulin or diabetes medicines to help lower your blood glucose or help your body make more insulin.  If you skip or delay a meal or snack.  If you are ill.  During and after exercise. You can prevent hypoglycemia by working with your health care provider to adjust your meal [name] as needed and by taking  other precautions. How can hypoglycemia affect me? Mild symptoms Mild hypoglycemia may not cause any symptoms. If you do have symptoms, they may include:  Hunger.  Anxiety.  Sweating and feeling clammy.  Dizziness or feeling light-headed.  Sleepiness.  Nausea.  Increased heart rate.  Headache.  Blurry vision.  Irritability.  Tingling or numbness around the mouth, lips, or tongue.  A change in coordination.  Restless sleep. If mild hypoglycemia is not recognized and treated, it can quickly become moderate or severe hypoglycemia. Moderate symptoms Moderate hypoglycemia can cause:  Mental confusion and poor judgment.  Behavior changes.  Weakness.  Irregular heartbeat. Severe symptoms Severe hypoglycemia is a medical emergency. It can cause:  Fainting.  Seizures.  Loss of consciousness (coma).  Death. What nutrition changes can be made?  Work with your health care provider or diet and nutrition specialist (dietitian) to make a healthy meal [name] that is right for you. Follow your meal [name] carefully.  Eat meals at regular times.  If recommended by your health care provider, have snacks between meals.  Donot skip or delay meals or snacks. You can be at risk for hypoglycemia if you are not getting enough carbohydrates. What lifestyle changes can be made?   Work closely with your health care provider to manage your blood glucose. Make sure you know: ? Your goal blood glucose levels. ? How and when to check your blood glucose. ? The symptoms of hypoglycemia. It is important to treat it right away to keep it from becoming severe.  Do not drink alcohol on an empty stomach.  When you are ill, check your blood glucose more often than usual. Follow your sick day [name] whenever you cannot eat or drink normally. Make this [name] in advance with your health care provider.  Always check your blood glucose before, during, and after exercise. How is this  treated? This condition can often be treated by immediately eating or drinking something that contains sugar, such as:  Fruit juice, 4-6 oz (120-150 mL).  Regular (not diet) soda, 4-6 oz (120-150 mL).  Low-fat milk, 4 oz (120 mL).  Several pieces of hard candy.  Sugar or honey, 1 Tbsp (15 mL). Treating hypoglycemia if you have diabetes If you are alert and able to swallow safely, follow the 15:15 rule:  Take 15 grams of a rapid-acting carbohydrate. Talk with your health care provider about how much you should take.  Rapid-acting options include: ? Glucose pills (take 15 grams). ? 6-8 pieces of hard candy. ? 4-6 oz (120-150 mL) of fruit juice. ? 4-6 oz (120-150 mL) of regular (not diet) soda.  Check your blood glucose 15 minutes after you take the carbohydrate.  If the repeat blood glucose level is still at or below 70 mg/dL (3.9 mmol/L), take 15 grams of a carbohydrate again.  If your blood glucose level does not increase above 70 mg/dL (3.9 mmol/L) after 3 tries, seek emergency medical care.  After your blood glucose level returns to normal, eat a meal or a snack within 1  hour. Treating severe hypoglycemia Severe hypoglycemia is when your blood glucose level is at or below 54 mg/dL (3 mmol/L). Severe hypoglycemia is a medical emergency. Get medical help right away. If you have severe hypoglycemia and you cannot eat or drink, you may need an injection of glucagon. A family member or close friend should learn how to check your blood glucose and how to give you a glucagon injection. Ask your health care provider if you need to have an emergency glucagon injection kit available. Severe hypoglycemia may need to be treated in a hospital. The treatment may include getting glucose through an IV. You may also need treatment for the cause of your hypoglycemia. Where to find more information  American Diabetes Association: www.diabetes.[employer] of Diabetes and Digestive and  Kidney Diseases: [employer] Contact a health care provider if:  You have problems keeping your blood glucose in your target range.  You have frequent episodes of hypoglycemia. Get help right away if:  You continue to have hypoglycemia symptoms after eating or drinking something containing glucose.  Your blood glucose level is at or below 54 mg/dL (3 mmol/L).  You faint.  You have a seizure. These symptoms may represent a serious problem that is an emergency. Do not wait to see if the symptoms will go away. Get medical help right away. Call your local emergency services (911 in the [place] Summary  Know the symptoms of hypoglycemia, and when you are at risk for it (such as during exercise or when you are sick). Check your blood glucose often when you are at risk for hypoglycemia.  Hypoglycemia can develop quickly, and it can be dangerous if it is not treated right away. If you have a history of severe hypoglycemia, make sure you know how to use your glucagon injection kit.  Make sure you know how to treat hypoglycemia. Keep a carbohydrate snack available when you may be at risk for hypoglycemia. This information is not intended to replace advice given to you by your health care provider. Make sure you discuss any questions you have with your health care provider. Document Revised: 07/27/2018 Document Reviewed: 11/30/2016 Elsevier Patient Education  [place].

## 2019-10-05 NOTE — Chronic Care Management (AMB) (Signed)
Chronic Care Management Pharmacy  Name: Alexis [name]  MRN: [id number] DOB: [birth year]  Chief Complaint/ HPI  Alexis [name],  69 y.o. , female presents for their Follow-Up CCM visit with the clinical pharmacist via telephone.  PCP : [name], MD   Their chronic conditions include: HTN, diverticulosis, type 2 DM, hypercholesterolemia, tremor, sciatica, CAD  No office visits since last CCM on 09/19/19  Allergies  Allergen Reactions  . Milk-Related Compounds     Rash/rhinorrhea.   . Peanut-Containing Drug Products     rash  . Statins Other (See Comments)    Myalgia- but able to tolerate crestor 10mg    Medications: Outpatient Encounter Medications as of 10/01/2019  Medication Sig  . ACCU-CHEK AVIVA PLUS test strip TEST BLOOD SUGAR TWICE DAILY  AND AS NEEDED AS DIRECTED  . ACCU-CHEK SOFTCLIX LANCETS lancets USE AS INSTRUCTED TO CHECK BLOOD SUGAR TWICE DAILY AND AS NEEDED  . aspirin EC 81 MG tablet Take 81 mg by mouth daily.  . DROPLET INSULIN SYRINGE 30G X 5/16" 0.5 ML MISC USE TO INJECT INSULIN TWICE DAILY AS DIRECTED  . enalapril (VASOTEC) 20 MG tablet TAKE 1 TABLET (20 MG TOTAL) BY MOUTH DAILY.  Marland Kitchen gabapentin (NEURONTIN) 400 MG capsule Take 1 capsule (400 mg total) by mouth at bedtime.  . insulin NPH-regular Human (NOVOLIN 70/30) (70-30) 100 UNIT/ML injection 46 units in AM and 36 units in PM  . loratadine (CLARITIN) 10 MG tablet Take 1 tablet (10 mg total) by mouth daily.  . metFORMIN (GLUCOPHAGE) 1000 MG tablet TAKE 1 TABLET TWICE DAILY WITH A MEAL  . omeprazole (PRILOSEC) 20 MG capsule Take 20 mg by mouth daily.  Marland Kitchen oxyCODONE-acetaminophen (PERCOCET) 5-325 MG tablet Take 1 tablet by mouth every 6 (six) hours as needed for severe pain.  . Probiotic Product (ALIGN) 4 MG CAPS Take 1 capsule by mouth daily.  . propranolol (INDERAL) 10 MG tablet TAKE 1 TABLET TWICE DAILY  . rosuvastatin (CRESTOR) 10 MG tablet TAKE 1 TABLET EVERY DAY   No facility-administered encounter  medications on file as of 10/01/2019.   Current Diagnosis/Assessment:   Emergency planning/management officer Strain: Medium Risk  . Difficulty of Paying Living Expenses: Somewhat hard   Goals Addressed            This Visit's Progress   . Pharmacy Care [name]       CARE [name] ENTRY  Current Barriers:  . Chronic Disease Management support, education, and care coordination needs related to Hypertension and Diabetes   Hypertension . Pharmacist Clinical Goal(s): o Over the next 30 days days, patient will work with PharmD and providers to achieve BP goal <140/90 mmHg Office blood pressures are: BP Readings from Last 3 Encounters:  08/22/19 (!) 147/88  01/14/19 (!) 154/70  05/25/18 (!) 156/90 .  Recent Home BP readings: 120/74, 134/70 . Current regimen:   Enalapril 20 mg - 1 tablet daily  Propranolol 10 mg - 1 tablet twice daily  . Interventions: o Reviewed adherence, patient is taking medications as prescribed o Recommend continuing home monitoring for the next 4 weeks if home monitor is working  . Patient self care activities - Over the next 30 days, patient will: o Check blood pressure 2-3 days per week at the same time each day (before breakfast is best), document, and provide at future appointment with pharmacist o Ensure daily salt intake < 2300 mg/day  Diabetes . Pharmacist Clinical Goal(s): o Over the next 30 days, patient will work  with PharmD and providers to achieve A1c goal <7% and avoid hypoglycemia  . Current regimen:   Metformin 1000 mg - 1 tablet twice daily with meal  Insulin NPH - Inject 46 units every morning and 30 units every evening before meals . Interventions o Recommend high protein snack at bedtime to reduce overnight hypoglycemia, such as hard boiled egg, handful of nuts, plain yogurt, low fat cheese; Carbohydrate snacks should be limited to 15 grams of carbs.  . Patient self care activities - Over the next 30 days, patient will: o Check blood sugar daily, before  breakfast for 2 weeks. If BG is within 80-130, begin checking 2 hours after evening meal instead for 2 weeks. Check again with any symptoms of hypoglycemia o Contact pharmacist with any episodes of hypoglycemia o Try high protein snack options before bed, such as hard boiled egg, handful of nuts, plain yogurt, low fat cheese o Schedule annual foot exam   Please see past updates related to this goal by clicking on the "Past Updates" button in the selected goal       Hypertension   CMP Latest Ref Rng & Units 02/21/2019 02/15/2018 11/30/2017  Glucose 70 - 99 mg/dL 227(H) 77 176(H)  BUN 6 - 23 mg/dL 19 16 16   Creatinine 0.40 - 1.20 mg/dL 0.78 0.78 0.86  Sodium 135 - 145 mEq/L 139 140 140  Potassium 3.5 - 5.1 mEq/L 4.6 4.6 4.8  Chloride 96 - 112 mEq/L 102 102 100  CO2 19 - 32 mEq/L 30 35(H) 33(H)  Calcium 8.4 - 10.5 mg/dL 9.7 10.1 10.3  Total Protein 6.0 - 8.3 g/dL 7.4 7.9 -  Total Bilirubin 0.2 - 1.2 mg/dL 0.3 0.4 -  Alkaline Phos 39 - 117 U/L 80 95 -  AST 0 - 37 U/L 14 12 -  ALT 0 - 35 U/L 10 8 -   Office blood pressures are: BP Readings from Last 3 Encounters:  08/22/19 (!) 147/88  01/14/19 (!) 154/70  05/25/18 (!) 156/90   Patient has failed these meds in the past: none  Patient checks BP at home: checked often over the past 2 weeks, but BP cuff didn't always work Patient home BP readings: 120/74, 134/70 (before breakfast in the past 2 weeks)  BP goal < 140/90 mmHg Patient is currently controlled on the following medications:   Enalapril 20 mg - 1 tablet daily  Propranolol 10 mg - 1 tablet BID (for tremor)  Adherence: refills timely, confirms adherence Assessment: BP at home is much lower than in clinic; recommend continuing current therapy and trying to adjust settings on BP monitor to see if that fixes the error.   [name]: Continue current medications; Continue checking BP at home 2-3 days per week for the next 4 weeks.   Diabetes   Recent Relevant Labs: Lab Results    Component Value Date/Time   HGBA1C 8.7 (A) 06/04/2019 10:05 AM   HGBA1C 8.4 (H) 02/21/2019 10:24 AM   HGBA1C 9.5 (A) 10/08/2018 09:19 AM   HGBA1C 8.7 (H) 02/15/2018 12:13 PM    Checking BG: Daily (fasting) Recent FBG Readings: 6/9 - 6/15 --> 93, 128, 85, 111, 82, 135, 63 (today) Recent bedtime Readings: 163, 194 Hypoglycemia: 1 episode in past 2 weeks --> Reports eating fish and squash, potatoes, and onion for dinner last night and 1 little Debbie cake at bedtime; BG was low this morning (63). Report she also spent a lot of time outside working yesterday, walked more than usual.  A1c goal < 7% Patient has failed these meds in past: none reported Patient is currently uncontrolled on the following medications:   Metformin 1000 mg - 1 tablet twice daily with meal  Insulin NPH - 46 AM, 30 PM before meals  Last diabetic eye exam:  Lab Results  Component Value Date/Time   HMDIABEYEEXA No Retinopathy 12/21/2017 12:00 AM    Last diabetic foot exam: 05/25/18 - DUE for annual   Adherence: continues metformin as prescribed, reports reducing evening NPH from 36 to 30 units as directed, but adds 2-4 additional units depending on what she is eating Diet: continues cereal most nights at bedtime  Counseling: recommended trying healthy, high protein snacks at bedtime to see if this prevent nighttime or fasting lows Assessment: much improved from 6-7 episodes of hypoglycemia to 1 episode, the hypoglycemia could be contributed to pt taking extra 2-4 units at dinner; recommended sticking to 30 units before dinner until hypoglycemia resolved   [name]: Continue current medications. Avoid adding extra units of insulin at evening meal. Continue to monitor BG every morning for 2 weeks, if BG between 80-130, then switch to checking 2 hours after largest meal for 2 weeks.  CCM Follow Up: 4 weeks (telephone)   [name], PharmD Clinical Pharmacist [facility] at [facility] [phone]

## 2019-10-07 DIAGNOSIS — S42301D Unspecified fracture of shaft of humerus, right arm, subsequent encounter for fracture with routine healing: Secondary | ICD-10-CM | POA: Diagnosis not present

## 2019-10-09 DIAGNOSIS — S42301D Unspecified fracture of shaft of humerus, right arm, subsequent encounter for fracture with routine healing: Secondary | ICD-10-CM | POA: Diagnosis not present

## 2019-10-10 DIAGNOSIS — S62619D Displaced fracture of proximal phalanx of unspecified finger, subsequent encounter for fracture with routine healing: Secondary | ICD-10-CM | POA: Diagnosis not present

## 2019-10-14 DIAGNOSIS — S42301D Unspecified fracture of shaft of humerus, right arm, subsequent encounter for fracture with routine healing: Secondary | ICD-10-CM | POA: Diagnosis not present

## 2019-10-16 DIAGNOSIS — S42301D Unspecified fracture of shaft of humerus, right arm, subsequent encounter for fracture with routine healing: Secondary | ICD-10-CM | POA: Diagnosis not present

## 2019-10-17 DIAGNOSIS — S62619D Displaced fracture of proximal phalanx of unspecified finger, subsequent encounter for fracture with routine healing: Secondary | ICD-10-CM | POA: Diagnosis not present

## 2019-10-24 DIAGNOSIS — S62619D Displaced fracture of proximal phalanx of unspecified finger, subsequent encounter for fracture with routine healing: Secondary | ICD-10-CM | POA: Diagnosis not present

## 2019-11-01 ENCOUNTER — Other Ambulatory Visit: Payer: Self-pay | Admitting: *Deleted

## 2019-11-01 MED ORDER — TRUE METRIX METER W/DEVICE KIT: PACK | 0 refills | Status: DC

## 2019-11-01 MED ORDER — TRUE METRIX BLOOD GLUCOSE TEST VI STRP: ORAL_STRIP | 3 refills | Status: DC

## 2019-11-01 MED ORDER — TRUEPLUS LANCETS 33G MISC: 3 refills | Status: DC

## 2019-11-01 MED ORDER — BD SWAB SINGLE USE REGULAR PADS: MEDICATED_PAD | 5 refills | Status: DC

## 2019-11-01 MED ORDER — TRUE METRIX LEVEL 2 NORMAL VI SOLN: 3 refills | Status: DC

## 2019-11-01 NOTE — Telephone Encounter (Signed)
Electronic refill request. Gabapentin Last office visit:   06/14/2019 Last Filled:   02/28/2019 Please advise.

## 2019-11-03 MED ORDER — GABAPENTIN 400 MG PO CAPS: 400.0000 mg | ORAL_CAPSULE | Freq: Every day | ORAL | 1 refills | Status: DC

## 2019-11-03 NOTE — Telephone Encounter (Signed)
Sent. Thanks.  She has pharmacy follow-up pending here at [facility].  I will await that note.

## 2019-11-04 ENCOUNTER — Telehealth: Payer: Self-pay | Admitting: *Deleted

## 2019-11-04 NOTE — Telephone Encounter (Signed)
Noted. Thanks.

## 2019-11-04 NOTE — Telephone Encounter (Signed)
Patient called stating that she has had rectal bleeding 3 times since October 27, 2019. Patient stated that this has happened 3 times after a bowel movements. Patient stated that she does have a history of hemorrhoids Patient stated that when this happens it does discolor the water in the toilet. Patient denies abdominal pain. Patient stated that her stomach does feel at times that it may be upset but she does not get sick. Patient stated that she does have a family history of colon cancer. Patient scheduled to see Dr. [name] 11/05/19 at 10:00. Patient denies any chest pain, fever, SOB or difficulty breathing. Patient was given ER precautions and she verbalized understanding. Negative covid screening.

## 2019-11-05 ENCOUNTER — Ambulatory Visit (INDEPENDENT_AMBULATORY_CARE_PROVIDER_SITE_OTHER): Payer: Medicare HMO | Admitting: Family Medicine

## 2019-11-05 ENCOUNTER — Other Ambulatory Visit: Payer: Self-pay

## 2019-11-05 ENCOUNTER — Encounter: Payer: Self-pay | Admitting: Family Medicine

## 2019-11-05 ENCOUNTER — Telehealth: Payer: Medicare HMO

## 2019-11-05 ENCOUNTER — Other Ambulatory Visit: Payer: Self-pay | Admitting: *Deleted

## 2019-11-05 VITALS — BP 134/78 | HR 86 | Temp 97.7°F | Ht 64.0 in | Wt 228.2 lb

## 2019-11-05 DIAGNOSIS — Z794 Long term (current) use of insulin: Secondary | ICD-10-CM

## 2019-11-05 DIAGNOSIS — E1140 Type 2 diabetes mellitus with diabetic neuropathy, unspecified: Secondary | ICD-10-CM

## 2019-11-05 DIAGNOSIS — K625 Hemorrhage of anus and rectum: Secondary | ICD-10-CM | POA: Diagnosis not present

## 2019-11-05 LAB — CBC WITH DIFFERENTIAL/PLATELET
Basophils Absolute: 0 10*3/uL (ref 0.0–0.1)
Basophils Relative: 0.4 % (ref 0.0–3.0)
Eosinophils Absolute: 0.4 10*3/uL (ref 0.0–0.7)
Eosinophils Relative: 3.9 % (ref 0.0–5.0)
HCT: 37.8 % (ref 36.0–46.0)
Hemoglobin: 12.4 g/dL (ref 12.0–15.0)
Lymphocytes Relative: 23.9 % (ref 12.0–46.0)
Lymphs Abs: 2.3 10*3/uL (ref 0.7–4.0)
MCHC: 32.8 g/dL (ref 30.0–36.0)
MCV: 84.1 fl (ref 78.0–100.0)
Monocytes Absolute: 0.5 10*3/uL (ref 0.1–1.0)
Monocytes Relative: 5.6 % (ref 3.0–12.0)
Neutro Abs: 6.3 10*3/uL (ref 1.4–7.7)
Neutrophils Relative %: 66.2 % (ref 43.0–77.0)
Platelets: 308 10*3/uL (ref 150.0–400.0)
RBC: 4.49 Mil/uL (ref 3.87–5.11)
RDW: 14.8 % (ref 11.5–15.5)
WBC: 9.6 10*3/uL (ref 4.0–10.5)

## 2019-11-05 LAB — HEMOGLOBIN A1C: Hgb A1c MFr Bld: 7.7 % — ABNORMAL HIGH (ref 4.6–6.5)

## 2019-11-05 MED ORDER — GABAPENTIN 400 MG PO CAPS: 400.0000 mg | ORAL_CAPSULE | Freq: Every day | ORAL | 1 refills | Status: DC

## 2019-11-05 MED ORDER — TRUEPLUS LANCETS 33G MISC: 3 refills | Status: DC

## 2019-11-05 MED ORDER — HYDROCORTISONE ACETATE 25 MG RE SUPP
25.0000 mg | Freq: Two times a day (BID) | RECTAL | 0 refills | Status: DC
Start: 2019-11-05 — End: 2021-02-16

## 2019-11-05 MED ORDER — TRUE METRIX LEVEL 2 NORMAL VI SOLN: 3 refills | Status: DC

## 2019-11-05 MED ORDER — TRUE METRIX METER W/DEVICE KIT: PACK | 0 refills | Status: AC

## 2019-11-05 MED ORDER — NYSTATIN 100000 UNIT/GM EX POWD: 1.0000 "application " | Freq: Three times a day (TID) | CUTANEOUS | 1 refills | Status: AC

## 2019-11-05 MED ORDER — TRUE METRIX BLOOD GLUCOSE TEST VI STRP: ORAL_STRIP | 3 refills | Status: AC

## 2019-11-05 MED ORDER — BD SWAB SINGLE USE REGULAR PADS: MEDICATED_PAD | 5 refills | Status: DC

## 2019-11-05 NOTE — Progress Notes (Signed)
This visit occurred during the SARS-CoV-2 public health emergency.  Safety protocols were in place, including screening questions prior to the visit, additional usage of staff PPE, and extensive cleaning of exam room while observing appropriate contact time as indicated for disinfecting solutions.  Sugar was 110 this AM, usually 100-120.  Still on insulin 46u AM and 36 u PM.  Still on metformin.    She had f/u about her hand after prev fracture.  She is still splinting at night and doing home exercises.  D/w pt.  She does have full L 5th extension in the AM but then some dec in ext as the day goes on.  She isn't having pain.  Her R shoulder is better.  Is careful with heavy lifting.  She is still doing HEP for her shoulder.  D/w pt.   BRBPR with BM but not o/w.  Noted a few times recently.  Intermittently.  Some BRBPR this AM.  H/o hemorrhoids.   No black stools.  Minimally lightheaded on standing, unclear if related to bloody stools.  Some rectal pain.  No FCNAVD.    Meds, vitals, and allergies reviewed.   ROS: Per HPI unless specifically indicated in ROS section   ncat Head tremor at baseline.   rrr ctab Abd soft, not ttp Trace BLE edema.  Chaperoned exam with minimally visible int hemorrhoid but no external hemorrhoid or fissure.  She has B gluteal crease irritation.  Left hand with normal range of motion of the first 4 fingers but she has some decrease in flexion of the left fifth finger at baseline.  Diabetic foot exam: Normal inspection No skin breakdown No calluses  Normal DP pulses Normal sensation to light touch and monofilament Nails normal

## 2019-11-05 NOTE — Patient Instructions (Signed)
I would use nystatin powder externally and hydrocortisone suppositories internally.   Go to the lab on the way out.   If you have mychart we'll likely use that to update you.    Update me as needed.  Take care.  Glad to see you.

## 2019-11-06 DIAGNOSIS — K625 Hemorrhage of anus and rectum: Secondary | ICD-10-CM | POA: Insufficient documentation

## 2019-11-06 NOTE — Assessment & Plan Note (Signed)
No change in medications at this point.  Continue insulin and Metformin as above.  See notes on labs.

## 2019-11-06 NOTE — Assessment & Plan Note (Signed)
Likely from internal hemorrhoid.  Discussed options.  She has external irritation and can use nystatin for that.  She can use hydrocortisone suppository in the meantime and update me if not better.  See notes on CBC.  Okay for outpatient follow-up.  Benign abdominal exam.  She agrees with plan.

## 2019-11-12 ENCOUNTER — Other Ambulatory Visit: Payer: Self-pay | Admitting: *Deleted

## 2019-11-12 MED ORDER — TRUE METRIX LEVEL 2 NORMAL VI SOLN: 3 refills | Status: AC

## 2019-11-12 MED ORDER — TRUEPLUS LANCETS 33G MISC: 3 refills | Status: AC

## 2019-11-12 MED ORDER — BD SWAB SINGLE USE REGULAR PADS: MEDICATED_PAD | 5 refills | Status: AC

## 2019-11-21 ENCOUNTER — Other Ambulatory Visit: Payer: Self-pay | Admitting: Cardiovascular Disease

## 2019-11-21 ENCOUNTER — Other Ambulatory Visit: Payer: Self-pay | Admitting: Family Medicine

## 2020-02-18 ENCOUNTER — Telehealth: Payer: Self-pay | Admitting: Family Medicine

## 2020-02-18 DIAGNOSIS — Z780 Asymptomatic menopausal state: Secondary | ICD-10-CM

## 2020-02-18 NOTE — Telephone Encounter (Signed)
Margaretha @ [facility]  They are trying to get a bone density order for pt.  She stated pt has had a recent bone fracture.  Please advise

## 2020-02-18 NOTE — Progress Notes (Signed)
Opened in error

## 2020-02-19 ENCOUNTER — Ambulatory Visit (INDEPENDENT_AMBULATORY_CARE_PROVIDER_SITE_OTHER): Payer: Medicare HMO

## 2020-02-19 ENCOUNTER — Other Ambulatory Visit: Payer: Self-pay

## 2020-02-19 DIAGNOSIS — Z23 Encounter for immunization: Secondary | ICD-10-CM | POA: Diagnosis not present

## 2020-02-19 NOTE — Telephone Encounter (Signed)
Patient notified as instructed by telephone and verbalized understanding. 

## 2020-02-19 NOTE — Telephone Encounter (Signed)
Tried to call patient and unable to leave a message because voicemail has not yet been set up. Will try to call her again later.

## 2020-02-19 NOTE — Telephone Encounter (Signed)
Patient notified as instructed by telephone and verbalized understanding. Patient stated that she is not sure when she had her last bone density test done. Patient stated that she is okay having the test done if Dr. [name] feels that she needs too.

## 2020-02-19 NOTE — Telephone Encounter (Signed)
Last done 2018.  I put in the order to be done in [place].  She should get a call about that.  Thanks.

## 2020-02-19 NOTE — Telephone Encounter (Signed)
Please do not release any information to the insurance company.  Please check with the patient.  Does she want to go for the bone density test?  Please let me know.  Thanks.

## 2020-02-21 ENCOUNTER — Telehealth: Payer: Self-pay | Admitting: Family Medicine

## 2020-02-21 NOTE — Progress Notes (Signed)
  Chronic Care Management   Outreach Note  02/21/2020 Name: Alexis [name] MRN: [id number] DOB: 04-Feb-1951  Referred by: [name], MD Reason for referral : Chronic Care Management   An unsuccessful telephone outreach was attempted today. The patient was referred to the pharmacist for assistance with care management and care coordination.   Follow Up Plan:  Hilario Quarry  Upstream Scheduler

## 2020-02-28 ENCOUNTER — Ambulatory Visit: Payer: Medicare HMO | Admitting: Nurse Practitioner

## 2020-02-28 ENCOUNTER — Other Ambulatory Visit: Payer: Self-pay

## 2020-02-28 ENCOUNTER — Encounter: Payer: Self-pay | Admitting: Nurse Practitioner

## 2020-02-28 VITALS — BP 150/70 | HR 88 | Ht 64.0 in | Wt 228.0 lb

## 2020-02-28 DIAGNOSIS — E782 Mixed hyperlipidemia: Secondary | ICD-10-CM | POA: Diagnosis not present

## 2020-02-28 DIAGNOSIS — I10 Essential (primary) hypertension: Secondary | ICD-10-CM | POA: Diagnosis not present

## 2020-02-28 DIAGNOSIS — I2510 Atherosclerotic heart disease of native coronary artery without angina pectoris: Secondary | ICD-10-CM | POA: Diagnosis not present

## 2020-02-28 DIAGNOSIS — R079 Chest pain, unspecified: Secondary | ICD-10-CM

## 2020-02-28 MED ORDER — PROPRANOLOL HCL 20 MG PO TABS: 20.0000 mg | ORAL_TABLET | Freq: Two times a day (BID) | ORAL | 3 refills | Status: DC

## 2020-02-28 NOTE — Progress Notes (Signed)
Office Visit    Patient Name: Alexis [name] Date of Encounter: 02/28/2020  Primary Care Provider:  [name], MD Primary Cardiologist:  Ida Rogue, MD  Chief Complaint    69 y/o ? w/ a h/o hypertension, hyperlipidemia, insulin-dependent diabetes, chest pain with negative stress test, coronary calcium and aortic atherosclerosis, GERD, and obesity, who presents for follow-up of hypertension and chest pain.  Past Medical History    Past Medical History:  Diagnosis Date  . Allergy   . Asthma    in childhood  . Benign tumor of pelvic bones, sacrum, and coccyx   . Blood in stool   . Chest pain    a. 07/2017 MV: EF 63%, no ischemia/infarct. GI update artifact.  . Diabetes mellitus    dx'd at age ~59  . GERD (gastroesophageal reflux disease)   . Hx of colonic polyps   . Hyperlipidemia   . Hypertension    dx'd at age ~53  . Morbid obesity ([place])   . Sleep apnea    on CPAP  . Tremor    only in face; worse under periods of stress. father with tremor also   Past Surgical History:  Procedure Laterality Date  . [place]   Surgery included removal of benign pelvic tumor  . COLONOSCOPY  01/20/2017  . LAPAROSCOPIC APPENDECTOMY N/A 10/30/2015   Procedure: APPENDECTOMY LAPAROSCOPIC, lysis of adhesions;  Surgeon: Hubbard Robinson, MD;  Location: [facility] ORS;  Service: General;  Laterality: N/A;  . OVARY BIOPSY  1988  . pelvic bone tumor  1988  . TONSILLECTOMY AND ADENOIDECTOMY  1957  . UPPER GI ENDOSCOPY  01/20/2017    Allergies  Allergies  Allergen Reactions  . Milk-Related Compounds     Rash/rhinorrhea.   . Peanut-Containing Drug Products     rash  . Statins Other (See Comments)    Myalgia- but able to tolerate crestor 32m    History of Present Illness    69year old female with the above past medical history including hypertension, hyperlipidemia, insulin-dependent diabetes mellitus, chest pain, coronary calcium, aortic atherosclerosis,  GERD, and obesity.  She previously reported chest pain in April 2019 and underwent stress testing which was low risk with normal LV function.  She lives locally with her husband, who is in poor health.  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  She is relatively sedentary but is able to push mow her yard.  She does have to take breaks because she gets tired but does not typically experience chest pain or dyspnea.  She denies palpitations, PND, orthopnea, dizziness, syncope, or early satiety.  She sometimes notes lower extremity swelling when sitting for long periods of time.  Diabetes is followed closely by primary care most recent A1c was 7.7 in July of this year.  Home Medications    Prior to Admission medications   Medication Sig Start Date End Date Taking? Authorizing Provider  Alcohol Swabs (B-D SINGLE USE SWABS REGULAR) PADS Use as instructed to check blood sugar twice daily.  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 11/12/19  Yes DTonia Ghent MD  aspirin EC 81 MG tablet Take 81 mg by mouth daily.   Yes [provider]  Blood Glucose Calibration (TRUE METRIX LEVEL 2) Normal SOLN Use as instructed to check blood sugar meter.  Diagnosis:  E11.9  Insulin dependent 11/12/19  Yes DTonia Ghent MD  Blood Glucose Monitoring Suppl (TRUE METRIX METER) w/Device KIT Use to check blood sugar  twice daily.  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 11/05/19  Yes [name], MD  DROPLET INSULIN SYRINGE 30G X 5/16" 0.5 ML MISC USE TO INJECT INSULIN TWICE DAILY AS DIRECTED 05/21/18  Yes [name], MD  enalapril (VASOTEC) 20 MG tablet TAKE 1 TABLET EVERY DAY 11/22/19  Yes [name], MD  gabapentin (NEURONTIN) 400 MG capsule Take 1 capsule (400 mg total) by mouth at bedtime. 11/05/19  Yes [name], MD  glucose blood (TRUE METRIX BLOOD GLUCOSE TEST) test strip Use as instructed to check blood sugar twice daily.  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 11/05/19  Yes [name], MD    hydrocortisone (ANUSOL-HC) 25 MG suppository Place 1 suppository (25 mg total) rectally 2 (two) times daily. 11/05/19  Yes [name], MD  insulin NPH-regular Human (NOVOLIN 70/30) (70-30) 100 UNIT/ML injection 46 units in AM and 36 units in PM 12/04/17  Yes [name], MD  loratadine (CLARITIN) 10 MG tablet Take 1 tablet (10 mg total) by mouth daily. 10/14/16  Yes [name], MD  metFORMIN (GLUCOPHAGE) 1000 MG tablet TAKE 1 TABLET TWICE DAILY WITH MEALS 11/22/19  Yes [name], MD  nystatin (MYCOSTATIN/NYSTOP) powder Apply 1 application topically 3 (three) times daily. 11/05/19  Yes [name], MD  omeprazole (PRILOSEC) 20 MG capsule Take 20 mg by mouth daily.   Yes [provider]  Probiotic Product (ALIGN) 4 MG CAPS Take 1 capsule by mouth daily.   Yes [provider]  propranolol (INDERAL) 10 MG tablet TAKE 1 TABLET TWICE DAILY 11/22/19  Yes [name], MD  rosuvastatin (CRESTOR) 10 MG tablet TAKE 1 TABLET EVERY DAY 11/22/19  Yes Gollan, Kathlene November, MD  TRUEplus Lancets 33G MISC Use as instructed to check blood sugar twice daily.  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 11/12/19  Yes [name], MD    Review of Systems    Occasional lower extremity/dependent edema.  She denies chest pain, dyspnea, palpitations, PND, orthopnea, dizziness, syncope, edema, or early satiety.  All other systems reviewed and are otherwise negative except as noted above.  Physical Exam    VS:  BP (!) 150/70 (BP Location: Left Arm, Patient Position: Sitting, Cuff Size: Normal)   Pulse 88   Ht _0  (1.626 m)   Wt 228 lb (103.4 kg)   SpO2 94%   BMI 39.14 kg/m  , BMI Body mass index is 39.14 kg/m.  Blood pressure 160/80 on repeat. GEN: Obese, in no acute distress. HEENT: normal.  Resting tremor involving her head/neck. Neck: Supple, obese, difficult to gauge JVP.  No carotid bruits, or masses. Cardiac: RRR, no murmurs, rubs, or gallops. No clubbing, cyanosis,  trace bilateral ankle edema.  Radials/PT 1+ and equal bilaterally.  Respiratory:  Respirations regular and unlabored, clear to auscultation bilaterally. GI: Soft, nontender, nondistended, BS + x 4. MS: no deformity or atrophy. Skin: warm and dry, no rash. Neuro:  Strength and sensation are intact. Psych: Normal affect.  Accessory Clinical Findings    ECG personally reviewed by me today -regular sinus rhythm, 88 - no acute changes.  Lab Results  Component Value Date   WBC 9.6 11/05/2019   HGB 12.4 11/05/2019   HCT 37.8 11/05/2019   MCV 84.1 11/05/2019   PLT 308.0 11/05/2019   Lab Results  Component Value Date   CREATININE 0.78 02/21/2019   BUN 19 02/21/2019   NA 139 02/21/2019   K  4.6 02/21/2019   CL 102 02/21/2019   CO2 30 02/21/2019   Lab Results  Component Value Date   ALT 10 02/21/2019   AST 14 02/21/2019   ALKPHOS 80 02/21/2019   BILITOT 0.3 02/21/2019   Lab Results  Component Value Date   CHOL 133 02/21/2019   HDL 25.90 (L) 02/21/2019   LDLCALC 69 11/15/2017   LDLDIRECT 78.0 02/21/2019   TRIG 223.0 (H) 02/21/2019   CHOLHDL 5 02/21/2019    Lab Results  Component Value Date   HGBA1C 7.7 (H) 11/05/2019    Assessment & Plan    1.  Chest pain/coronary calcium and aortic atherosclerosis: Status post stress testing in 2019 which was nonischemic.  No recent chest pain or significant dyspnea.  She remains on aspirin, beta-blocker, and statin therapy.  2.  Essential hypertension: Blood pressure elevated today at 150/70 and 160/80 on repeat.  She does not routinely check this at home.  I am going to increase her propranolol to 20 mg twice daily.  She remains on enalapril 20 mg daily as well.  She does have a blood pressure cuff at home and will begin following this more regularly at home.  3.  Hyperlipidemia: LDL of 78 last year.  She remains on statin therapy.  4.  Type 2 diabetes mellitus: Followed close by primary care with an A1c of 7.7 in July.  She is on  insulin and Metformin.  5.  Morbid obesity: Encouraged increase activity.  6.  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  Increasing dose in setting of ongoing hypertension.  7.  Disposition: Patient will follow blood pressure more frequently at home.  She will contact us if she remains elevated otherwise, we will plan to follow-up in 1 year.  [name], NP 02/28/2020, 11:05 AM

## 2020-02-28 NOTE — Patient Instructions (Signed)
Medication Instructions:  - Your physician has recommended you make the following change in your medication:   1) INCREASE propranolol to 20 mg- take 1 tablet by mouth TWICE daily   *If you need a refill on your cardiac medications before your next appointment, please call your pharmacy*   Lab Work: - none ordered  If you have labs (blood work) drawn today and your tests are completely normal, you will receive your results only by: Marland Kitchen MyChart Message (if you have MyChart) OR . A paper copy in the mail If you have any lab test that is abnormal or we need to change your treatment, we will call you to review the results.   Testing/Procedures: - none ordered   Follow-Up: At [facility], you and your health needs are our priority.  As part of our continuing mission to provide you with exceptional heart care, we have created designated Provider Care Teams.  These Care Teams include your primary Cardiologist (physician) and Advanced Practice Providers (APPs -  Physician Assistants and Nurse Practitioners) who all work together to provide you with the care you need, when you need it.  We recommend signing up for the patient portal called "MyChart".  Sign up information is provided on this After Visit Summary.  MyChart is used to connect with patients for Virtual Visits (Telemedicine).  Patients are able to view lab/test results, encounter notes, upcoming appointments, etc.  Non-urgent messages can be sent to your provider as well.   To learn more about what you can do with MyChart, go to [email or web address].    Your next appointment:   1 year(s)  The format for your next appointment:   In Person  Provider:   You may see Ida Rogue, MD or one of the following Advanced Practice Providers on your designated Care Team:    [name], NP  Christell Faith, PA-C  Marrianne Mood, PA-C  Cadence Aurora, [place]  Laurann Montana, NP    Other Instructions n/a

## 2020-03-18 ENCOUNTER — Telehealth: Payer: Self-pay

## 2020-03-18 NOTE — Chronic Care Management (AMB) (Signed)
Chronic Care Management Pharmacy Assistant   Name: Alexis [name]  MRN: [id number] DOB: 1950/10/29  Reason for Encounter: Disease State  Patient Questions:  1.  Have you seen any other providers since your last visit? Yes 02/28/20 Alexis Hodgkins, NP- Cardiology- Increase propranolol to 20 mg twice daily. 11/05/19 Dr. Orlinda [name]- PCP. Rectal bleeding. Added hydrocortisone acetate 25 mg suppository twice daily. Added Nystatin 100000 unit/gram to apply 3 times daily. 10/10/19 Alexis [name]- Orthopedics- Finger fracture. No changes   2.  Any changes in your medicines or health? Increased propranolol to 20 mg twice daily, Added hydrocortisone acetate 25 mg suppository twice daily and Nystatin 100000 unit/gram to apply 3 times daily as needed.      PCP : Alexis Ghent, MD  Allergies:   Allergies  Allergen Reactions  . Milk-Related Compounds     Rash/rhinorrhea.   . Peanut-Containing Drug Products     rash  . Statins Other (See Comments)    Myalgia- but able to tolerate crestor 26m    Medications: Outpatient Encounter Medications as of 03/18/2020  Medication Sig  . Alcohol Swabs (B-D SINGLE USE SWABS REGULAR) PADS Use as instructed to check blood sugar twice daily.  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  .Marland Kitchenaspirin EC 81 MG tablet Take 81 mg by mouth daily.  . Blood Glucose Calibration (TRUE METRIX LEVEL 2) Normal SOLN Use as instructed to check blood sugar meter.  Diagnosis:  E11.9  Insulin dependent  . Blood Glucose Monitoring Suppl (TRUE METRIX METER) w/Device KIT Use to check blood sugar twice daily.  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  . DROPLET INSULIN SYRINGE 30G X 5/16" 0.5 ML MISC USE TO INJECT INSULIN TWICE DAILY AS DIRECTED  . enalapril (VASOTEC) 20 MG tablet TAKE 1 TABLET EVERY DAY  . gabapentin (NEURONTIN) 400 MG capsule Take 1 capsule (400 mg total) by mouth at bedtime.  .Marland Kitchenglucose blood (TRUE METRIX BLOOD GLUCOSE TEST) test strip Use as instructed to  check blood sugar twice daily.  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  . hydrocortisone (ANUSOL-HC) 25 MG suppository Place 1 suppository (25 mg total) rectally 2 (two) times daily.  . insulin NPH-regular Human (NOVOLIN 70/30) (70-30) 100 UNIT/ML injection 46 units in AM and 36 units in PM  . loratadine (CLARITIN) 10 MG tablet Take 1 tablet (10 mg total) by mouth daily.  . metFORMIN (GLUCOPHAGE) 1000 MG tablet TAKE 1 TABLET TWICE DAILY WITH MEALS  . nystatin (MYCOSTATIN/NYSTOP) powder Apply 1 application topically 3 (three) times daily.  .Marland Kitchenomeprazole (PRILOSEC) 20 MG capsule Take 20 mg by mouth daily.  . Probiotic Product (ALIGN) 4 MG CAPS Take 1 capsule by mouth daily.  . propranolol (INDERAL) 20 MG tablet Take 1 tablet (20 mg total) by mouth 2 (two) times daily.  . rosuvastatin (CRESTOR) 10 MG tablet TAKE 1 TABLET EVERY DAY  . TRUEplus Lancets 33G MISC Use as instructed to check blood sugar twice daily.  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   No facility-administered encounter medications on file as of 03/18/2020.    Current Diagnosis: Patient Active Problem List   Diagnosis Date Noted  . BRBPR (bright red blood per rectum) 11/06/2019  . Rhinitis 08/22/2018  . Sciatica 05/27/2018  . Knee pain 05/27/2018  . Advance care planning 02/18/2018  . Aortic atherosclerosis ([place] 08/04/2017  . Coronary artery calcification seen on CAT scan 08/04/2017  . Shortness of breath 08/04/2017  . Mixed hyperlipidemia 08/04/2017  . Exertional  chest pain 08/02/2017  . Nosebleed 01/29/2017  . Health care maintenance 01/29/2017  . Cough 10/16/2016  . Dysphagia 09/29/2016  . Tremor 09/29/2016  . Postoperative anemia 12/30/2015  . FH: colon cancer 10/18/2011  . Meralgia paraesthetica 02/15/2011  . Type 2 diabetes mellitus with diabetic neuropathy, unspecified ([place]) 11/11/2010  . Shoulder pain, left 09/21/2010  . Vaginitis 08/10/2010  . Diverticulosis 08/09/2010  . Pure hypercholesterolemia 07/27/2010   . Essential hypertension, benign 07/27/2010    Recent Relevant Labs: Lab Results  Component Value Date/Time   HGBA1C 7.7 (H) 11/05/2019 10:42 AM   HGBA1C 8.7 (A) 06/04/2019 10:05 AM   HGBA1C 8.4 (H) 02/21/2019 10:24 AM    Kidney Function Lab Results  Component Value Date/Time   CREATININE 0.78 02/21/2019 10:24 AM   CREATININE 0.78 02/15/2018 12:13 PM   GFR 73.29 02/21/2019 10:24 AM   GFRNONAA >60 11/02/2015 06:40 AM   GFRAA >60 11/02/2015 06:40 AM    . Current antihyperglycemic regimen:   Metformin 1000 mg - 1 tablet twice daily with meal  Insulin NPH - Inject 46 units every morning and 30 units every evening before meals  . What recent interventions/DTPs have been made to improve glycemic control:  o No recent changes. She does not eat much sugar.   . Have there been any recent hospitalizations or ED visits since last visit with CPP? No   . Patient reports hypoglycemic symptoms, including Pale, Sweaty and Vision changes   . Patient denies hyperglycemic symptoms, including blurry vision, excessive thirst, fatigue, polyuria and weakness   . How often are you checking your blood sugar? once daily   . What are your blood sugars ranging?  o Fasting: states "sugars run between 90-93"  o Before meals: n/a o After meals: n/a o Bedtime: n/a . During the week, how often does your blood glucose drop below 70? Rarely. States 03/16/20 it was 61 fasting. She states her BGL does not usually drop that low. She states she did not eat a snack before bedtime that nigh as she usually does  and feels that was the reason it was low.  . Are you checking your feet daily/regularly? Yes- denies any wounds, redness or blisters.  Adherence Review: Is the patient currently on a STATIN medication? Yes rosuvastatin Is the patient currently on ACE/ARB medication? Yes - Enalapril Does the patient have >5 day gap between last estimated fill dates? No  She notes that has not been taking her blood  pressure regularly since her cardiologist increased her medicine. She notes that her blood pressure "has been running good". She also notes she recently got her covid-19 booster and it made her very sick. She had a sore throat, chills, fever and fatigue.   Follow-Up:  Pharmacist Review   [name], CPP notified  Margaretmary Dys, [place] Pharmacy Assistant [phone]

## 2020-05-27 ENCOUNTER — Telehealth: Payer: Self-pay

## 2020-05-27 NOTE — Chronic Care Management (AMB) (Addendum)
Chronic Care Management Pharmacy Assistant   Name: Alexis [name]  MRN: [id number] DOB: [birth year]  Reason for Encounter: Disease State- Diabetes and Hypertension  Patient Question:  1.  Have you seen any other providers since your last visit? No  PCP : [name], MD  Allergies:   Allergies  Allergen Reactions   Milk-Related Compounds     Rash/rhinorrhea.    Peanut-Containing Drug Products     rash   Statins Other (See Comments)    Myalgia- but able to tolerate crestor 28m    Medications: Outpatient Encounter Medications as of 05/27/2020  Medication Sig   Alcohol Swabs (B-D SINGLE USE SWABS REGULAR) PADS Use as instructed to check blood sugar twice daily.  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   aspirin EC 81 MG tablet Take 81 mg by mouth daily.   Blood Glucose Calibration (TRUE METRIX LEVEL 2) Normal SOLN Use as instructed to check blood sugar meter.  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   DROPLET INSULIN SYRINGE 30G X 5/16" 0.5 ML MISC USE TO INJECT INSULIN TWICE DAILY AS DIRECTED   enalapril (VASOTEC) 20 MG tablet TAKE 1 TABLET EVERY DAY   gabapentin (NEURONTIN) 400 MG capsule Take 1 capsule (400 mg total) by mouth at bedtime.   glucose blood (TRUE METRIX BLOOD GLUCOSE TEST) test strip Use as instructed to check blood sugar twice daily.  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   hydrocortisone (ANUSOL-HC) 25 MG suppository Place 1 suppository (25 mg total) rectally 2 (two) times daily.   insulin NPH-regular Human (NOVOLIN 70/30) (70-30) 100 UNIT/ML injection 46 units in AM and 36 units in PM   loratadine (CLARITIN) 10 MG tablet Take 1 tablet (10 mg total) by mouth daily.   metFORMIN (GLUCOPHAGE) 1000 MG tablet TAKE 1 TABLET TWICE DAILY WITH MEALS   nystatin (MYCOSTATIN/NYSTOP) powder Apply 1 application topically 3 (three) times  daily.   omeprazole (PRILOSEC) 20 MG capsule Take 20 mg by mouth daily.   Probiotic Product (ALIGN) 4 MG CAPS Take 1 capsule by mouth daily.   propranolol (INDERAL) 20 MG tablet Take 1 tablet (20 mg total) by mouth 2 (two) times daily.   rosuvastatin (CRESTOR) 10 MG tablet TAKE 1 TABLET EVERY DAY   TRUEplus Lancets 33G MISC Use as instructed to check blood sugar twice daily.  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   No facility-administered encounter medications on file as of 05/27/2020.    Current Diagnosis: Patient Active Problem List   Diagnosis Date Noted   BRBPR (bright red blood per rectum) 11/06/2019   Rhinitis 08/22/2018   Sciatica 05/27/2018   Knee pain 05/27/2018   Advance care planning 02/18/2018   Aortic atherosclerosis ([place] 08/04/2017   Coronary artery calcification seen on CAT scan 08/04/2017   Shortness of breath 08/04/2017   Mixed hyperlipidemia 08/04/2017   Exertional chest pain 08/02/2017   Nosebleed 01/29/2017   Health care maintenance 01/29/2017   Cough 10/16/2016   Dysphagia 09/29/2016   Tremor 09/29/2016   Postoperative anemia 12/30/2015   FH: colon cancer 10/18/2011   Meralgia paraesthetica 02/15/2011   Type 2 diabetes mellitus with diabetic neuropathy, unspecified ([place] 11/11/2010   Shoulder pain, left 09/21/2010   Vaginitis 08/10/2010   Diverticulosis 08/09/2010   Pure hypercholesterolemia 07/27/2010   Essential hypertension, benign 07/27/2010    Recent Relevant  Labs: Lab Results  Component Value Date/Time   HGBA1C 7.7 (H) 11/05/2019 10:42 AM   HGBA1C 8.7 (A) 06/04/2019 10:05 AM   HGBA1C 8.4 (H) 02/21/2019 10:24 AM    Kidney Function Lab Results  Component Value Date/Time   CREATININE 0.78 02/21/2019 10:24 AM   CREATININE 0.78 02/15/2018 12:13 PM   GFR 73.29 02/21/2019 10:24 AM   GFRNONAA >60 11/02/2015 06:40 AM   GFRAA >60 11/02/2015 06:40 AM   Diabetes  Current antihyperglycemic regimen:  Metformin 1000 mg - 1 tablet twice daily with  meal Insulin NPH - Inject 46 units every morning and 30 units every evening before meals  What recent interventions/DTPs have been made to improve glycemic control:  No recent interventions to improve glycemic control.  Have there been any recent hospitalizations or ED visits since last visit with CPP? No  Patient denies hypoglycemic symptoms, including Pale, Sweaty, Shaky, Hungry, Nervous/irritable and Vision changes  Patient denies hyperglycemic symptoms, including blurry vision, excessive thirst, fatigue, polyuria and weakness  How often are you checking your blood sugar? before breakfast  What are your blood sugars ranging?  Fasting: 89, 120, 90, 107 Before meals: N/A After meals: N/A Bedtime: N/A  On insulin? Novolin 70/30 How many units:  Inject 46 units every morning and 30 units every evening before meals  During the week, how often does your blood glucose drop below 70? Rare- states it did drop to 60 about 1 month ago. She was able to bring it up quickly.   Are you checking your feet daily/regularly? Yes- denies any wounds or sores  Adherence Review: Is the patient currently on a STATIN medication? Yes Is the patient currently on ACE/ARB medication? Yes Does the patient have >5 day gap between last estimated fill dates? CPP to review   Hypertension  Current antihypertensive regimen:  Enalapril 20 mg - 1 tablet daily Propranolol  20 mg twice daily.   How often are you checking your Blood Pressure? Noy monitoring. Will follow up in about 10 days to review log.   Patient states she is doing well. She is not monitoring her blood pressure but agreed to a follow up next week, 06/05/20 to review log. Denies any dizziness or headaches.    Follow-Up:  Pharmacist Review  [name], CPP notified  Margaretmary Dys, [place] [phone]  Reviewed refill history. Metformin, enalapril, and rosuvastatin last filled 02/14/2020 90 DS. Would  like to make sure patient is not missing any doses at follow up call as these are > 5 days past due. Scheduled CCM visit for next month.   [name], PharmD Clinical Pharmacist [facility] [phone]

## 2020-06-05 NOTE — Chronic Care Management (AMB) (Addendum)
Attempted to contact patient to review updated blood pressure readings. Patient did not answer, no voicemail available. Will try again next week.   Follow-Up:  Pharmacist Review  [name], CPP notified  Margaretmary Dys, [place] Pharmacy Assistant [phone]

## 2020-06-09 NOTE — Chronic Care Management (AMB) (Addendum)
Attempted to contacted patient today to review blood pressure log. Patient did not answer. No voicemail. Will follow up next month.   Follow-Up:  Pharmacist Review  [name], CPP notified  Margaretmary Dys, [place] Pharmacy Assistant [phone]

## 2020-07-13 ENCOUNTER — Telehealth: Payer: Medicare HMO

## 2020-07-13 NOTE — Progress Notes (Deleted)
Chronic Care Management Pharmacy Note  07/13/2020 Name:  Alexis [name] MRN:  [id number] DOB:  22-Mar-1951  Subjective: Alexis [name] is an 70 y.o. year old female who is a primary patient of Alexis [name], Alexis Rising, MD.  The CCM team was consulted for assistance with disease management and care coordination needs.    Engaged with patient by telephone for follow up visit in response to provider referral for pharmacy case management and/or care coordination services.   Consent to Services:  The patient was given information about Chronic Care Management services, agreed to services, and gave verbal consent prior to initiation of services.  Please see initial visit note for detailed documentation.   Patient Care Team: [name], MD as PCP - General (Family Medicine) [name], MD as PCP - Cardiology (Cardiology) [name], [facility] as Pharmacist (Pharmacist)  Recent office visits: 11/05/19 Dr. Orlinda Blalock- PCP. Rectal bleeding. Added hydrocortisone acetate 25 mg suppository twice daily. Added Nystatin 100000 unit/gram to apply 3 times daily.  Recent consult visits: 02/28/20 [name], NP- Cardiology- Increase propranolol to 20 mg twice daily. 10/10/19 Alexis [name]- Orthopedics- Finger fracture. No changes  Hospital visits: None in previous 6 months  Objective:  Lab Results  Component Value Date   CREATININE 0.78 02/21/2019   BUN 19 02/21/2019   GFR 73.29 02/21/2019   GFRNONAA >60 11/02/2015   GFRAA >60 11/02/2015   NA 139 02/21/2019   K 4.6 02/21/2019   CALCIUM 9.7 02/21/2019   CO2 30 02/21/2019   GLUCOSE 227 (H) 02/21/2019    Lab Results  Component Value Date/Time   HGBA1C 7.7 (H) 11/05/2019 10:42 AM   HGBA1C 8.7 (A) 06/04/2019 10:05 AM   HGBA1C 8.4 (H) 02/21/2019 10:24 AM   GFR 73.29 02/21/2019 10:24 AM   GFR 78.14 02/15/2018 12:13 PM    Last diabetic Eye exam:  Lab Results  Component Value Date/Time   HMDIABEYEEXA No Retinopathy  12/21/2017 12:00 AM    Last diabetic Foot exam: No results found for: HMDIABFOOTEX   Lab Results  Component Value Date   CHOL 133 02/21/2019   HDL 25.90 (L) 02/21/2019   LDLCALC 69 11/15/2017   LDLDIRECT 78.0 02/21/2019   TRIG 223.0 (H) 02/21/2019   CHOLHDL 5 02/21/2019    Hepatic Function Latest Ref Rng & Units 02/21/2019 02/15/2018 09/29/2016  Total Protein 6.0 - 8.3 g/dL 7.4 7.9 7.6  Albumin 3.5 - 5.2 g/dL 4.0 4.1 3.9  AST 0 - 37 U/L 14 12 14   ALT 0 - 35 U/L 10 8 10   Alk Phosphatase 39 - 117 U/L 80 95 92  Total Bilirubin 0.2 - 1.2 mg/dL 0.3 0.4 0.4    No results found for: TSH, FREET4  CBC Latest Ref Rng & Units 11/05/2019 12/30/2015 11/04/2015  WBC 4.0 - 10.5 K/uL 9.6 10.3 10.1  Hemoglobin 12.0 - 15.0 g/dL 12.4 12.1 9.4(L)  Hematocrit 36.0 - 46.0 % 37.8 36.3 26.8(L)  Platelets 150.0 - 400.0 K/uL 308.0 342.0 287    No results found for: VD25OH  Clinical ASCVD: Yes  The 10-year ASCVD risk score [name], et al., 2013) is: 30.8%   Values used to calculate the score:     Age: 34 years     Sex: Female     Is Non-Hispanic African American: No     Diabetic: Yes     Tobacco smoker: No     Systolic Blood Pressure: 163 mmHg     Is BP treated:  Yes     HDL Cholesterol: 25.9 mg/dL     Total Cholesterol: 133 mg/dL    Depression screen [facility] 121 2/9 02/21/2019 02/15/2018 01/24/2017  Decreased Interest 0 0 0  Down, Depressed, Hopeless 0 0 0  PHQ - 2 Score 0 0 0  Altered sleeping 0 0 0  Tired, decreased energy 0 0 0  Change in appetite 0 0 0  Feeling bad or failure about yourself  0 0 0  Trouble concentrating 0 0 0  Moving slowly or fidgety/restless 0 0 0  Suicidal thoughts 0 0 0  PHQ-9 Score 0 0 0  Difficult doing work/chores Not difficult at all Not difficult at all Not difficult at all   Social History   Tobacco Use  Smoking Status Never Smoker  Smokeless Tobacco Never Used   BP Readings from Last 3 Encounters:  02/28/20 (!) 150/70  11/05/19 134/78  08/22/19 (!)  147/88   Pulse Readings from Last 3 Encounters:  02/28/20 88  11/05/19 86  08/22/19 89   Wt Readings from Last 3 Encounters:  02/28/20 228 lb (103.4 kg)  11/05/19 228 lb 3 oz (103.5 kg)  08/22/19 228 lb (103.4 kg)   BMI Readings from Last 3 Encounters:  02/28/20 39.14 kg/m  11/05/19 39.17 kg/m  08/22/19 39.14 kg/m    Assessment/Interventions: Review of patient past medical history, allergies, medications, health status, including review of consultants reports, laboratory and other test data, was performed as part of comprehensive evaluation and provision of chronic care management services.   SDOH:  (Social Determinants of Health) assessments and interventions performed: Yes  SDOH Screenings   Alcohol Screen: Not on file  Depression (KCM0-3): Not on file  Financial Resource Strain: Medium Risk  . Difficulty of Paying Living Expenses: Somewhat hard  Food Insecurity: Not on file  Housing: Not on file  Physical Activity: Not on file  Social Connections: Not on file  Stress: Not on file  Tobacco Use: Low Risk   . Smoking Tobacco Use: Never Smoker  . Smokeless Tobacco Use: Never Used  Transportation Needs: Not on file    CCM Care Plan  Allergies  Allergen Reactions  . Milk-Related Compounds     Rash/rhinorrhea.   . Peanut-Containing Drug Products     rash  . Statins Other (See Comments)    Myalgia- but able to tolerate crestor 20m    Medications Reviewed Today    Reviewed by BTheora Gianotti NP (Nurse Practitioner) on 02/28/20 at 1103  Med List Status: <None>  Medication Order Taking? Sig Documenting Provider Last Dose Status Informant  Alcohol Swabs (B-D SINGLE USE SWABS REGULAR) PADS [id number] Use as instructed to check blood sugar twice daily.  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 DTonia Ghent MD Taking Active   aspirin EC 81 MG tablet [id number] Take 81 mg by mouth daily. [provider] Taking Active   Blood Glucose  Calibration (TRUE METRIX LEVEL 2) Normal SOLN [id number] Use as instructed to check blood sugar meter.  Diagnosis:  E11.9  Insulin dependent DTonia Ghent MD Taking Active   Blood Glucose Monitoring Suppl (TRUE METRIX METER) w/Device KIT [id number] Use to check blood sugar twice daily.  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 DTonia Ghent MD Taking Active   DROPLET INSULIN SYRINGE 30G X 5/16" 0.5 ML MISC [id number] USE TO INJECT INSULIN TWICE DAILY AS DIRECTED DTonia Ghent MD Taking Active   enalapril (VASOTEC) 20 MG tablet [id number]  Yes TAKE 1 TABLET EVERY DAY [name], MD Taking Active   gabapentin (NEURONTIN) 400 MG capsule [id number] Yes Take 1 capsule (400 mg total) by mouth at bedtime. [name], MD Taking Active   glucose blood (TRUE METRIX BLOOD GLUCOSE TEST) test strip [id number] Yes Use as instructed to check blood sugar twice daily.  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 [name], MD Taking Active   hydrocortisone (ANUSOL-HC) 25 MG suppository [id number] Yes Place 1 suppository (25 mg total) rectally 2 (two) times daily. [name], MD Taking Active   insulin NPH-regular Human (NOVOLIN 70/30) (70-30) 100 UNIT/ML injection [id number] Yes 46 units in AM and 36 units in PM [name], MD Taking Active   loratadine (CLARITIN) 10 MG tablet [id number] Yes Take 1 tablet (10 mg total) by mouth daily. [name], MD Taking Active   metFORMIN (GLUCOPHAGE) 1000 MG tablet [id number] Yes TAKE 1 TABLET TWICE DAILY WITH MEALS [name], MD Taking Active   nystatin (MYCOSTATIN/NYSTOP) powder [id number] Yes Apply 1 application topically 3 (three) times daily. [name], MD Taking Active   omeprazole (PRILOSEC) 20 MG capsule [id number] Yes Take 20 mg by mouth daily. [provider] Taking Active   Probiotic Product (ALIGN) 4 MG CAPS [id number] Yes Take 1 capsule by mouth daily. [provider] Taking Active   propranolol  (INDERAL) 10 MG tablet [id number] Yes TAKE 1 TABLET TWICE DAILY [name], MD Taking Active   rosuvastatin (CRESTOR) 10 MG tablet [id number] Yes TAKE 1 TABLET EVERY DAY Rockey Situ Kathlene November, MD Taking Active   TRUEplus Lancets 33G [place] [id number] Yes Use as instructed to check blood sugar twice daily.  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 [name], MD Taking Active           Patient Active Problem List   Diagnosis Date Noted  . BRBPR (bright red blood per rectum) 11/06/2019  . Rhinitis 08/22/2018  . Sciatica 05/27/2018  . Knee pain 05/27/2018  . Advance care planning 02/18/2018  . Aortic atherosclerosis ([place]) 08/04/2017  . Coronary artery calcification seen on CAT scan 08/04/2017  . Shortness of breath 08/04/2017  . Mixed hyperlipidemia 08/04/2017  . Exertional chest pain 08/02/2017  . Nosebleed 01/29/2017  . Health care maintenance 01/29/2017  . Cough 10/16/2016  . Dysphagia 09/29/2016  . Tremor 09/29/2016  . Postoperative anemia 12/30/2015  . FH: colon cancer 10/18/2011  . Meralgia paraesthetica 02/15/2011  . Type 2 diabetes mellitus with diabetic neuropathy, unspecified ([place]) 11/11/2010  . Shoulder pain, left 09/21/2010  . Vaginitis 08/10/2010  . Diverticulosis 08/09/2010  . Pure hypercholesterolemia 07/27/2010  . Essential hypertension, benign 07/27/2010    Immunization History  Administered Date(s) Administered  . Fluad Quad(high Dose 65+) 02/21/2019, 02/19/2020  . Influenza Split 02/15/2011  . Influenza Whole 02/18/2013  . Influenza,inj,Quad PF,6+ Mos 05/29/2015, 02/02/2016, 01/27/2017, 02/15/2018  . Moderna Sars-Covid-2 Vaccination 05/31/2019, 06/28/2019  . Pneumococcal Conjugate-13 11/13/2013  . Pneumococcal Polysaccharide-23 04/18/2008, 08/31/2015  . Tdap 08/22/2019    Conditions to be addressed/monitored:  {USCCMDZASSESSMENTOPTIONS:23563}  There are no care plans that you recently modified to display for this patient.   Current Barriers:   . {pharmacybarriers:24917} . ***  Pharmacist Clinical Goal(s):  Marland Kitchen Patient will {PHARMACYGOALCHOICES:24921} through collaboration with PharmD and provider.  . ***  Interventions: . 1:1 collaboration with [name], MD regarding development and update of comprehensive plan of care as evidenced by provider attestation and co-signature . Inter-disciplinary care team  collaboration (see longitudinal plan of care) . Comprehensive medication review performed; medication list updated in electronic medical record  Hypertension (BP goal <140/90) -{US controlled/uncontrolled:25276} -Current treatment: . *** -Medications previously tried: ***  -Current home readings: *** -Current dietary habits: *** -Current exercise habits: *** -{ACTIONS;DENIES/REPORTS:21021675::"Denies"} hypotensive/hypertensive symptoms -Educated on {CCM BP Counseling:25124} -Counseled to monitor BP at home ***, document, and provide log at future appointments -{CCMPHARMDINTERVENTION:25122}  Hyperlipidemia: (LDL goal < ***) -{US controlled/uncontrolled:25276} -Current treatment: . *** -Medications previously tried: ***  -Current dietary patterns: *** -Current exercise habits: *** -Educated on {CCM HLD Counseling:25126} -{CCMPHARMDINTERVENTION:25122}  Diabetes (A1c goal {A1c goals:23924}) -{US controlled/uncontrolled:25276} -Current medications: . *** -Medications previously tried: ***  -Current home glucose readings . fasting glucose: *** . post prandial glucose: *** -{ACTIONS;DENIES/REPORTS:21021675::"Denies"} hypoglycemic/hyperglycemic symptoms -Current meal patterns:  . breakfast: ***  . lunch: ***  . dinner: *** . snacks: *** . drinks: *** -Current exercise: *** -Educated on {CCM DM COUNSELING:25123} -Counseled to check feet daily and get yearly eye exams -{CCMPHARMDINTERVENTION:25122}   Patient Goals/Self-Care Activities . Patient will:  - {pharmacypatientgoals:24919}  Follow Up Plan:  {CM FOLLOW UP VTVN:50413}  Medication Assistance: {MEDASSISTANCEINFO:25044}  Patient's preferred pharmacy is:  [facility], [place] [address] Phone: [phone] Fax: [phone]  Uses pill box? {Yes or If no, why not?:20788} Pt endorses ***% compliance  We discussed: {Pharmacy options:24294} Patient decided to: {US Pharmacy Plan:23885}  Care Plan and Follow Up Patient Decision:  {FOLLOWUP:24991}  Plan: {CM FOLLOW UP PLAN:25073}  ***

## 2020-07-16 ENCOUNTER — Telehealth: Payer: Self-pay

## 2020-07-16 NOTE — Chronic Care Management (AMB) (Addendum)
Chronic Care Management Pharmacy Assistant   Name: Alexis [name]  MRN: [id number] DOB: March 18, 1951  Reason for Encounter: Disease State- Diabetes and Hypertension   Conditions to be addressed/monitored: HTN and DMII  Recent office visits:  None since last CCM contact  Recent consult visits:  None  Hospital visits:  None in previous 6 months  Medications: Outpatient Encounter Medications as of 07/16/2020  Medication Sig   Alcohol Swabs (B-D SINGLE USE SWABS REGULAR) PADS Use as instructed to check blood sugar twice daily.  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   aspirin EC 81 MG tablet Take 81 mg by mouth daily.   Blood Glucose Calibration (TRUE METRIX LEVEL 2) Normal SOLN Use as instructed to check blood sugar meter.  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   DROPLET INSULIN SYRINGE 30G X 5/16" 0.5 ML MISC USE TO INJECT INSULIN TWICE DAILY AS DIRECTED   enalapril (VASOTEC) 20 MG tablet TAKE 1 TABLET EVERY DAY   gabapentin (NEURONTIN) 400 MG capsule Take 1 capsule (400 mg total) by mouth at bedtime.   glucose blood (TRUE METRIX BLOOD GLUCOSE TEST) test strip Use as instructed to check blood sugar twice daily.  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   hydrocortisone (ANUSOL-HC) 25 MG suppository Place 1 suppository (25 mg total) rectally 2 (two) times daily.   insulin NPH-regular Human (NOVOLIN 70/30) (70-30) 100 UNIT/ML injection 46 units in AM and 36 units in PM   loratadine (CLARITIN) 10 MG tablet Take 1 tablet (10 mg total) by mouth daily.   metFORMIN (GLUCOPHAGE) 1000 MG tablet TAKE 1 TABLET TWICE DAILY WITH MEALS   nystatin (MYCOSTATIN/NYSTOP) powder Apply 1 application topically 3 (three) times daily.   omeprazole (PRILOSEC) 20 MG capsule Take 20 mg by mouth daily.   Probiotic Product (ALIGN) 4 MG CAPS Take 1 capsule by mouth daily.    propranolol (INDERAL) 20 MG tablet Take 1 tablet (20 mg total) by mouth 2 (two) times daily.   rosuvastatin (CRESTOR) 10 MG tablet TAKE 1 TABLET EVERY DAY   TRUEplus Lancets 33G MISC Use as instructed to check blood sugar twice daily.  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   No facility-administered encounter medications on file as of 07/16/2020.    Recent Office Vitals: BP Readings from Last 3 Encounters:  02/28/20 (!) 150/70  11/05/19 134/78  08/22/19 (!) 147/88   Pulse Readings from Last 3 Encounters:  02/28/20 88  11/05/19 86  08/22/19 89    Wt Readings from Last 3 Encounters:  02/28/20 228 lb (103.4 kg)  11/05/19 228 lb 3 oz (103.5 kg)  08/22/19 228 lb (103.4 kg)     Kidney Function Lab Results  Component Value Date/Time   CREATININE 0.78 02/21/2019 10:24 AM   CREATININE 0.78 02/15/2018 12:13 PM   GFR 73.29 02/21/2019 10:24 AM   GFRNONAA >60 11/02/2015 06:40 AM   GFRAA >60 11/02/2015 06:40 AM    BMP Latest Ref Rng & Units 02/21/2019 02/15/2018 11/30/2017  Glucose 70 - 99 mg/dL 227(H) 77 176(H)  BUN 6 - 23 mg/dL 19 16 16   Creatinine 0.40 - 1.20 mg/dL 0.78 0.78 0.86  Sodium 135 - 145 mEq/L 139 140 140  Potassium 3.5 - 5.1 mEq/L 4.6 4.6 4.8  Chloride 96 - 112 mEq/L 102 102 100  CO2 19 - 32 mEq/L 30 35(H)  33(H)  Calcium 8.4 - 10.5 mg/dL 9.7 10.1 10.3   Multiple attempts made to contact patient on 07/08/20 and 07/16/20 to discuss blood pressure and blood sugars. Unsuccessful outreach.   Current antihypertensive regimen:  Propranolol 20 mg twice daily. (Increased by Ignacia Bayley, NP 02/28/20) Enalapril 20 mg 1 tablet daily  What recent interventions/DTPs have been made by any provider to improve Blood Pressure control since last CPP Visit:  No recent interventions noted in chart  Any recent hospitalizations or ED visits since last visit with CPP? No hospitalizations noted in chart.    Adherence Review: Is the patient currently on ACE/ARB medication? Yes Does the  patient have >5 day gap between last estimated fill dates? CPP to review     Recent Relevant Labs: Lab Results  Component Value Date/Time   HGBA1C 7.7 (H) 11/05/2019 10:42 AM   HGBA1C 8.7 (A) 06/04/2019 10:05 AM   HGBA1C 8.4 (H) 02/21/2019 10:24 AM    Kidney Function Lab Results  Component Value Date/Time   CREATININE 0.78 02/21/2019 10:24 AM   CREATININE 0.78 02/15/2018 12:13 PM   GFR 73.29 02/21/2019 10:24 AM   GFRNONAA >60 11/02/2015 06:40 AM   GFRAA >60 11/02/2015 06:40 AM     Current antihyperglycemic regimen:  Metformin 1000 mg - 1 tablet twice daily with meal Insulin NPH - Inject 46 units every morning and 30 units every evening before meals   What recent interventions/DTPs have been made to improve glycemic control:  No recent interventions noted in chart  Have there been any recent hospitalizations or ED visits since last visit with CPP? No hospitalizations noted in chart.   Adherence Review: Is the patient currently on a STATIN medication? Yes Is the patient currently on ACE/ARB medication? Yes Does the patient have >5 day gap between last estimated fill dates? No  Star Rating Drugs:  Medication:  Last Fill: Day Supply Enalapril 20 mg 04/29/20 90 Metformin 1000 mg 04/29/20 90 Rosuvastatin 10 mg 04/29/20 90    Follow-Up:  Pharmacist Review  [name], CPP notified  Margaretmary Dys, [place] Assistant [phone]   I have reviewed the care management and care coordination activities outlined in this encounter and I am certifying that I agree with the content of this note. No further action required.  [name], PharmD Clinical Pharmacist [facility] at [facility] [phone]

## 2020-07-30 ENCOUNTER — Other Ambulatory Visit: Payer: Self-pay | Admitting: Cardiovascular Disease

## 2020-07-30 ENCOUNTER — Other Ambulatory Visit: Payer: Self-pay | Admitting: Family Medicine

## 2020-08-24 ENCOUNTER — Telehealth: Payer: Self-pay

## 2020-08-24 NOTE — Chronic Care Management (AMB) (Addendum)
Chronic Care Management Pharmacy Assistant   Name: Alexis [name]  MRN: [id number] DOB: 1950-07-29  Reason for Encounter: Disease State    Conditions to be addressed/monitored: HTN and DMII  Recent office visits:  None since last CCM contact  Recent consult visits:  None since last CCM contact  Hospital visits:  None in previous 6 months  Medications: Outpatient Encounter Medications as of 08/24/2020  Medication Sig   Alcohol Swabs (B-D SINGLE USE SWABS REGULAR) PADS Use as instructed to check blood sugar twice daily.  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   aspirin EC 81 MG tablet Take 81 mg by mouth daily.   Blood Glucose Calibration (TRUE METRIX LEVEL 2) Normal SOLN Use as instructed to check blood sugar meter.  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   DROPLET INSULIN SYRINGE 30G X 5/16" 0.5 ML MISC USE TO INJECT INSULIN TWICE DAILY AS DIRECTED   enalapril (VASOTEC) 20 MG tablet TAKE 1 TABLET EVERY DAY   gabapentin (NEURONTIN) 400 MG capsule Take 1 capsule (400 mg total) by mouth at bedtime.   glucose blood (TRUE METRIX BLOOD GLUCOSE TEST) test strip Use as instructed to check blood sugar twice daily.  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   hydrocortisone (ANUSOL-HC) 25 MG suppository Place 1 suppository (25 mg total) rectally 2 (two) times daily.   insulin NPH-regular Human (NOVOLIN 70/30) (70-30) 100 UNIT/ML injection 46 units in AM and 36 units in PM   loratadine (CLARITIN) 10 MG tablet Take 1 tablet (10 mg total) by mouth daily.   metFORMIN (GLUCOPHAGE) 1000 MG tablet TAKE 1 TABLET TWICE DAILY WITH MEALS   nystatin (MYCOSTATIN/NYSTOP) powder Apply 1 application topically 3 (three) times daily.   omeprazole (PRILOSEC) 20 MG capsule Take 20 mg by mouth daily.   Probiotic Product (ALIGN) 4 MG CAPS Take 1 capsule by mouth daily.    propranolol (INDERAL) 20 MG tablet Take 1 tablet (20 mg total) by mouth 2 (two) times daily.   rosuvastatin (CRESTOR) 10 MG tablet TAKE 1 TABLET EVERY DAY (PLEASE CALL AND SCHEDULE YOUR 12 MONTH FOLLOW UP. [phone])   TRUEplus Lancets 33G MISC Use as instructed to check blood sugar twice daily.  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   No facility-administered encounter medications on file as of 08/24/2020.    Recent Office Vitals: BP Readings from Last 3 Encounters:  02/28/20 (!) 150/70  11/05/19 134/78  08/22/19 (!) 147/88   Pulse Readings from Last 3 Encounters:  02/28/20 88  11/05/19 86  08/22/19 89    Wt Readings from Last 3 Encounters:  02/28/20 228 lb (103.4 kg)  11/05/19 228 lb 3 oz (103.5 kg)  08/22/19 228 lb (103.4 kg)     Kidney Function Lab Results  Component Value Date/Time   CREATININE 0.78 02/21/2019 10:24 AM   CREATININE 0.78 02/15/2018 12:13 PM   GFR 73.29 02/21/2019 10:24 AM   GFRNONAA >60 11/02/2015 06:40 AM   GFRAA >60 11/02/2015 06:40 AM    BMP Latest Ref Rng & Units 02/21/2019 02/15/2018 11/30/2017  Glucose 70 - 99 mg/dL 227(H) 77 176(H)  BUN 6 - 23 mg/dL 19 16 16   Creatinine 0.40 - 1.20 mg/dL 0.78 0.78 0.86  Sodium 135 - 145 mEq/L 139 140 140  Potassium 3.5 - 5.1 mEq/L 4.6 4.6 4.8  Chloride 96 - 112  mEq/L 102 102 100  CO2 19 - 32 mEq/L 30 35(H) 33(H)  Calcium 8.4 - 10.5 mg/dL 9.7 10.1 10.3   Attempted to reach patient by telephone for CCM hypertension review on 5/9, 5/12 and 5/17. No voicemail option available.   Current antihypertensive regimen:  Propranolol 20 mg twice daily.(Increased by Ignacia Bayley, NP 02/28/20) Enalapril 20 mg 1 tablet daily  Adherence Review: Is the patient currently on ACE/ARB medication? Yes Does the patient have >5 day gap between last estimated fill dates? CPP to review  Star Rating Drugs:  Medication:   Last Fill: Day Supply Enalapril 42m.  07/31/2020 90ds novolin 70/30)100 unit/ml 08/07/2020 90ds (droplet  insulin) Metformin 10087m 07/31/2020 90ds Rosuvastatin 1084m4/15/2022 90ds   Recent Relevant Labs: Lab Results  Component Value Date/Time   HGBA1C 7.7 (H) 11/05/2019 10:42 AM   HGBA1C 8.7 (A) 06/04/2019 10:05 AM   HGBA1C 8.4 (H) 02/21/2019 10:24 AM    Kidney Function Lab Results  Component Value Date/Time   CREATININE 0.78 02/21/2019 10:24 AM   CREATININE 0.78 02/15/2018 12:13 PM   GFR 73.29 02/21/2019 10:24 AM   GFRNONAA >60 11/02/2015 06:40 AM   GFRAA >60 11/02/2015 06:40 AM   Current antihyperglycemic regimen:  Metformin 1000 mg - 1 tablet twice daily with meal Insulin NPH - Inject 46 units every morning and 30 units every evening before meals  Follow-Up:  Pharmacist Review  MicDebbora DusPP notified  VelAvel SensorMLuckeysistant [phone] have reviewed the care management and care coordination activities outlined in this encounter and I am certifying that I agree with the content of this note. No further action required.  MicDebbora DusharmD Clinical Pharmacist [phone]

## 2020-12-28 ENCOUNTER — Telehealth: Payer: Self-pay

## 2020-12-28 NOTE — Progress Notes (Addendum)
Chronic Care Management Pharmacy Assistant   Name: Alexis [name]  MRN: [id number] DOB: 29-Jun-1950  Reason for Encounter: Hypertension and Diabetes Disease State   Recent office visits:  None since last CCM contact  Recent consult visits:  None since last CCM contact  Hospital visits:  None in previous 6 months  Medications: Outpatient Encounter Medications as of 12/28/2020  Medication Sig   Alcohol Swabs (B-D SINGLE USE SWABS REGULAR) PADS Use as instructed to check blood sugar twice daily.  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   aspirin EC 81 MG tablet Take 81 mg by mouth daily.   Blood Glucose Calibration (TRUE METRIX LEVEL 2) Normal SOLN Use as instructed to check blood sugar meter.  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   DROPLET INSULIN SYRINGE 30G X 5/16" 0.5 ML MISC USE TO INJECT INSULIN TWICE DAILY AS DIRECTED   enalapril (VASOTEC) 20 MG tablet TAKE 1 TABLET EVERY DAY   gabapentin (NEURONTIN) 400 MG capsule Take 1 capsule (400 mg total) by mouth at bedtime.   glucose blood (TRUE METRIX BLOOD GLUCOSE TEST) test strip Use as instructed to check blood sugar twice daily.  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   hydrocortisone (ANUSOL-HC) 25 MG suppository Place 1 suppository (25 mg total) rectally 2 (two) times daily.   insulin NPH-regular Human (NOVOLIN 70/30) (70-30) 100 UNIT/ML injection 46 units in AM and 36 units in PM   loratadine (CLARITIN) 10 MG tablet Take 1 tablet (10 mg total) by mouth daily.   metFORMIN (GLUCOPHAGE) 1000 MG tablet TAKE 1 TABLET TWICE DAILY WITH MEALS   nystatin (MYCOSTATIN/NYSTOP) powder Apply 1 application topically 3 (three) times daily.   omeprazole (PRILOSEC) 20 MG capsule Take 20 mg by mouth daily.   Probiotic Product (ALIGN) 4 MG CAPS Take 1 capsule by mouth daily.   propranolol (INDERAL) 20 MG  tablet Take 1 tablet (20 mg total) by mouth 2 (two) times daily.   rosuvastatin (CRESTOR) 10 MG tablet TAKE 1 TABLET EVERY DAY (PLEASE CALL AND SCHEDULE YOUR 12 MONTH FOLLOW UP. [phone])   TRUEplus Lancets 33G MISC Use as instructed to check blood sugar twice daily.  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   No facility-administered encounter medications on file as of 12/28/2020.    Recent Office Vitals: BP Readings from Last 3 Encounters:  02/28/20 (!) 150/70  11/05/19 134/78  08/22/19 (!) 147/88   Pulse Readings from Last 3 Encounters:  02/28/20 88  11/05/19 86  08/22/19 89    Wt Readings from Last 3 Encounters:  02/28/20 228 lb (103.4 kg)  11/05/19 228 lb 3 oz (103.5 kg)  08/22/19 228 lb (103.4 kg)    Kidney Function Lab Results  Component Value Date/Time   CREATININE 0.78 02/21/2019 10:24 AM   CREATININE 0.78 02/15/2018 12:13 PM   GFR 73.29 02/21/2019 10:24 AM   GFRNONAA >60 11/02/2015 06:40 AM   GFRAA >60 11/02/2015 06:40 AM   BMP Latest Ref Rng & Units 02/21/2019 02/15/2018 11/30/2017  Glucose 70 - 99 mg/dL 227(H) 77 176(H)  BUN 6 - 23 mg/dL 19 16 16   Creatinine 0.40 - 1.20 mg/dL 0.78 0.78 0.86  Sodium 135 - 145 mEq/L 139 140 140  Potassium 3.5 - 5.1 mEq/L 4.6 4.6 4.8  Chloride 96 - 112 mEq/L 102 102 100  CO2 19 -  32 mEq/L 30 35(H) 33(H)  Calcium 8.4 - 10.5 mg/dL 9.7 10.1 10.3   Contacted patient on 01/06/2021 to discuss hypertension disease state  Current antihypertensive regimen:  Propranolol 20 mg twice daily Enalapril 20 mg 1 tablet daily  Patient verbally confirms she is taking the above medications as directed. Yes  How often are you checking your Blood Pressure? Patient does not check BP at home often.   Wrist or arm cuff: Arm Caffeine intake: Drinks 1 cup of coffee Salt intake: Limits salt OTC medications including pseudoephedrine or NSAIDs? Every once in a while - only when her arm/hand is in pain.   What recent interventions/DTPs have been made  by any provider to improve Blood Pressure control since last CPP Visit: None  Any recent hospitalizations or ED visits since last visit with CPP? No  What diet changes have been made to improve Blood Pressure Control?  Patient is watching what she eats and tries not to eat bad.   What exercise is being done to improve your Blood Pressure Control?  Patient states she does not do regular exercise, but she mows the yard with a push mower.  Recent Relevant Labs: Lab Results  Component Value Date/Time   HGBA1C 7.7 (H) 11/05/2019 10:42 AM   HGBA1C 8.7 (A) 06/04/2019 10:05 AM   HGBA1C 8.4 (H) 02/21/2019 10:24 AM    Kidney Function Lab Results  Component Value Date/Time   CREATININE 0.78 02/21/2019 10:24 AM   CREATININE 0.78 02/15/2018 12:13 PM   GFR 73.29 02/21/2019 10:24 AM   GFRNONAA >60 11/02/2015 06:40 AM   GFRAA >60 11/02/2015 06:40 AM    Contacted patient on 01/06/2021 to discuss diabetes disease state.   Current antihyperglycemic regimen:  Metformin 1000 mg - 1 tablet twice daily with meal Insulin NPH - Inject 46 units every morning and 30 units every evening before meals  Patient verbally confirms she is taking the above medications as directed. Yes  What diet changes have been made to improve diabetes control? Patient watches her sugar intake. She will only eat sugar if her blood sugar is low.  What recent interventions/DTPs have been made to improve glycemic control: Watching what she eats  Have there been any recent hospitalizations or ED visits since last visit with CPP? No  Patient denies hypoglycemic symptoms, including Pale, Sweaty, Shaky, Hungry, Nervous/irritable, and Vision changes  Patient denies hyperglycemic symptoms, including blurry vision, excessive thirst, fatigue, polyuria, and weakness  How often are you checking your blood sugar? once daily  What are your blood sugars ranging? Patient does not keep a log. She checks her sugar first think in the  morning before she eats. Patient states it is typical between 100-120.  During the week, how often does your blood glucose drop below 70? Patient stated it has happened twice a few weeks ago (did not record). One morning it ws 64 and the other morning it was 60. Patient ate to bring up her sugar.   Are you checking your feet daily/regularly? Yes  Adherence Review: Is the patient currently on ACE/ARB medication? Yes Does the patient have >5 day gap between last estimated fill dates? No  Star Rating Drugs:  Medication:   Last Fill: Day Supply Enalapril 83m.                      10/14/2020      90 novolin 70/30)100 unit/ml       OTC (see note below) Metformin 10010m  10/14/2020        90 Rosuvastatin 57m                 10/14/2020        90  Patient stated [facility]   Care Gaps: Annual wellness visit in last year? No 02/15/2018 Most Recent BP reading: 150/70 on 02/28/2020  If Diabetic: Most recent A1C reading: 7.7 on 10/2019 Last eye exam / retinopathy screening: 12/21/2017 Last diabetic foot exam: 11/05/2019  MDebbora Dus CPP notified  AMarijean Niemann RBeulah BeachAssistant [phone]  I have reviewed the care management and care coordination activities outlined in this encounter and I am certifying that I agree with the content of this note. No further action required.  MDebbora Dus PharmD Clinical Pharmacist [facility]Primary [phone]

## 2021-01-06 DIAGNOSIS — Z1231 Encounter for screening mammogram for malignant neoplasm of breast: Secondary | ICD-10-CM | POA: Diagnosis not present

## 2021-01-06 LAB — HM MAMMOGRAPHY

## 2021-01-21 ENCOUNTER — Other Ambulatory Visit: Payer: Self-pay | Admitting: Family Medicine

## 2021-01-22 NOTE — Telephone Encounter (Signed)
Refill request for enalapril 20 mg tablets  LOV - 11/05/19 Next OV - not scheduled Last refill - 07/30/20 #90/2

## 2021-01-24 NOTE — Telephone Encounter (Signed)
Sent. Thanks.  Please schedule OV when possible.  We can do labs at the visit.    If a virtual visit is the only option, then we can set up labs at [place] [facility] after the VV.

## 2021-01-25 NOTE — Telephone Encounter (Signed)
Called pat and her vm is not set up

## 2021-01-25 NOTE — Telephone Encounter (Signed)
Patient has been scheduled for 01/29/21

## 2021-01-29 ENCOUNTER — Ambulatory Visit: Payer: Medicare HMO | Admitting: Family Medicine

## 2021-02-16 ENCOUNTER — Other Ambulatory Visit: Payer: Self-pay

## 2021-02-16 ENCOUNTER — Telehealth: Payer: Self-pay | Admitting: Family Medicine

## 2021-02-16 ENCOUNTER — Telehealth (INDEPENDENT_AMBULATORY_CARE_PROVIDER_SITE_OTHER): Payer: Medicare HMO | Admitting: Family Medicine

## 2021-02-16 DIAGNOSIS — U071 COVID-19: Secondary | ICD-10-CM | POA: Diagnosis not present

## 2021-02-16 MED ORDER — MOLNUPIRAVIR EUA 200MG CAPSULE: 4.0000 | ORAL_CAPSULE | Freq: Two times a day (BID) | ORAL | 0 refills | Status: AC

## 2021-02-16 NOTE — Telephone Encounter (Signed)
Called and scheduled patient with Dr. [name] this am at 10am

## 2021-02-16 NOTE — Telephone Encounter (Signed)
Pt called in stating that she tested positive for covid and want the provider to send some medicine in for covid. Pt wants the [facility] delivery service to drop medicine off. pt wants to be called

## 2021-02-16 NOTE — Progress Notes (Signed)
Interactive audio and video telecommunications were attempted between this provider and patient, however failed, due to patient having technical difficulties OR patient did not have access to video capability.  We continued and completed visit with audio only.   Virtual Visit via Telephone Note  I connected with patient on 02/16/21  at 10:24 AM  by telephone and verified that I am speaking with the correct person using two identifiers.  Location of patient: home   Location of MD: [place] Name of referring provider (if blank then none associated): Names per persons and role in encounter:  MD: Earlyne Iba, Patient: name listed above.    I discussed the limitations, risks, security and privacy concerns of performing an evaluation and management service by telephone and the availability of in person appointments. I also discussed with the patient that there may be a patient responsible charge related to this service. The patient expressed understanding and agreed to proceed.  CC: covid  History of Present Illness:  Patient tested positive for covid this morning after starting not feeling good yesterday. Patient has had low grade fever, runny nose, and sore throat. She has been taking tylenol for sx.  Not SOB.  Some cough and rhinorrhea.  Voice is hoarse.  Recent temp 99.7. she is taking fluids in the meantime.  ST is better today.    Her was was recently in hospital then needed readmission.  She has been there with her husband.  He was reportedly tested (negative) in the meantime.   Observations/Objective:nad Speech wnl except that her voice sounds slightly hoarse. Speaking in complete sentences.  Assessment and Plan:  Covid  D/w pt about molnupiravir.  Reasonable to start.  Routine cautions given to patient.  Emergency use authorization and rationale for treatment discussed with patient.  She notified hospital staff.  Quarantine cautions d/w pt, meaning timeline for quarantine and  then masking.  She will update me as needed.  Supportive care rest and fluids in the meantime.  She agrees with plan.  Okay for outpatient follow-up  Follow Up Instructions: see above.     I discussed the assessment and treatment plan with the patient. The patient was provided an opportunity to ask questions and all were answered. The patient agreed with the plan and demonstrated an understanding of the instructions.   The patient was advised to call back or seek an in-person evaluation if the symptoms worsen or if the condition fails to improve as anticipated.  I provided 15 minutes of non-face-to-face time during this encounter.  Elsie Stain, MD

## 2021-02-17 DIAGNOSIS — U071 COVID-19: Secondary | ICD-10-CM | POA: Insufficient documentation

## 2021-02-17 NOTE — Assessment & Plan Note (Signed)
  Covid  D/w pt about molnupiravir.  Reasonable to start.  Routine cautions given to patient.  Emergency use authorization and rationale for treatment discussed with patient.  She notified hospital staff.  Quarantine cautions d/w pt, meaning timeline for quarantine and then masking.  She will update me as needed.  Supportive care rest and fluids in the meantime.  She agrees with plan.  Okay for outpatient follow-up

## 2021-02-18 ENCOUNTER — Telehealth: Payer: Self-pay

## 2021-02-18 NOTE — Telephone Encounter (Signed)
Patient notified to continue using nystatin powder as needed and to call back if she needs anything else.

## 2021-02-18 NOTE — Telephone Encounter (Signed)
Agree with all of that.  Please update [place] as needed.  Thanks.

## 2021-02-18 NOTE — Telephone Encounter (Signed)
[facility] - Client TELEPHONE ADVICE RECORD [facility] Patient Name: Alexis [name] Gender: Female DOB: Jul 30, 1950 Age: 70 Y 70 M 10 D Return Phone Number: [phone] (Primary) Address: [place] [address] Client [facility] - Client Client Site [place] Physician Renford Dills - MD Contact Type Call Who Is Calling Patient / Member / Family / Caregiver Call Type Triage / Clinical Relationship To Patient Self Return Phone Number [phone] (Primary) Chief Complaint Medication Question (non symptomatic) Reason for Call Symptomatic / Request for Health Information Initial Comment Caller states [name] called in covid medicine but [facility] said it would be a next day delivery. It still has not arrived. Translation No No Triage Reason Patient declined Nurse Assessment Nurse: Laurence Compton, RN, Horris Latino Date/Time (Eastern Time): 02/18/2021 2:01:30 AM Confirm and document reason for call. If symptomatic, describe symptoms. ---Caller states she was not able to get her prescriptions delivered by [facility] She states it will be delivered tomorrow. Caller states her COVID symptoms are better. She also discovered a rash on her abdomen. Declined triage of symptoms. Does the patient have any new or worsening symptoms? ---Yes Will a triage be completed? ---No Select reason for no triage. ---Patient declined Disp. Time Eilene Ghazi Time) Disposition Final User 02/18/2021 2:08:29 AM Clinical Call Yes Du, RN, Acquanetta Sit

## 2021-02-18 NOTE — Telephone Encounter (Signed)
Pt said she can get covid med today at noon. Pt said symptoms started on 02/15/21. Pt wanted to know if still in time frame to be able to start med and I advised if she starts med today yes.Pt said she has rash at crease of belly and groin area where area is moist and has burning sensation; no blisters. pt has started nystatin on 02/17/21. Yesterday area had a burning sensation but started nystatin powder last night and today no burning sensation. Pt wants to know if should continue the nystatin powder and I advised yes and if Dr [name] has further or different instruction someone will call pt back. Pt voiced understanding and appreciative. Pt also added that she actually feels better today.

## 2021-03-02 ENCOUNTER — Telehealth: Payer: Self-pay | Admitting: Family Medicine

## 2021-03-02 NOTE — Telephone Encounter (Signed)
Called pt to schedule AWV with [facility]. But no answer, will try pt again.

## 2021-03-05 ENCOUNTER — Other Ambulatory Visit: Payer: Self-pay | Admitting: Family Medicine

## 2021-03-05 NOTE — Telephone Encounter (Signed)
Patient needs AWV scheduled; please call.

## 2021-03-06 ENCOUNTER — Encounter: Payer: Self-pay | Admitting: Family Medicine

## 2021-03-08 NOTE — Telephone Encounter (Signed)
Called Alexis [name] and she stated that she is going to call back due to she is planning a funeral Mr. Yzaguirre due to he passed last Friday.

## 2021-03-15 ENCOUNTER — Telehealth: Payer: Self-pay | Admitting: Family Medicine

## 2021-03-15 NOTE — Telephone Encounter (Signed)
Called to to schedule AWV with [facility]. But patient has a voice mail that has not been set up yet. So I was not able to LVM.

## 2021-04-29 ENCOUNTER — Other Ambulatory Visit: Payer: Self-pay | Admitting: Nurse Practitioner

## 2021-04-29 NOTE — Telephone Encounter (Signed)
Attempted to schedule no ans no vm  

## 2021-04-29 NOTE — Telephone Encounter (Signed)
Please schedule overdue F/U appointment with Dr. Rockey Situ or APP. Thank you!

## 2021-05-02 ENCOUNTER — Other Ambulatory Visit: Payer: Self-pay | Admitting: Family Medicine

## 2021-05-02 DIAGNOSIS — Z794 Long term (current) use of insulin: Secondary | ICD-10-CM

## 2021-05-02 DIAGNOSIS — E1140 Type 2 diabetes mellitus with diabetic neuropathy, unspecified: Secondary | ICD-10-CM

## 2021-05-07 NOTE — Telephone Encounter (Signed)
Scheduled

## 2021-05-10 NOTE — Progress Notes (Signed)
Subjective:   Alexis [name] is a 71 y.o. female who presents for Medicare Annual (Subsequent) preventive examination.  I connected with Alexis [name] today by telephone and verified that I am speaking with the correct person using two identifiers. Location patient: home Location provider: work Persons participating in the virtual visit: patient, Marine scientist.    I discussed the limitations, risks, security and privacy concerns of performing an evaluation and management service by telephone and the availability of in person appointments. I also discussed with the patient that there may be a patient responsible charge related to this service. The patient expressed understanding and verbally consented to this telephonic visit.    Interactive audio and video telecommunications were attempted between this provider and patient, however failed, due to patient having technical difficulties OR patient did not have access to video capability.  We continued and completed visit with audio only.  Some vital signs may be absent or patient reported.   Time Spent with patient on telephone encounter: 20 minutes  Review of Systems     Cardiac Risk Factors include: advanced age (>70mn, >>79women);diabetes mellitus;hypertension     Objective:    Today's Vitals   05/11/21 1156  Weight: 218 lb (98.9 kg)  Height: _0  (1.626 m)   Body mass index is 37.42 kg/m.  Advanced Directives 05/11/2021 08/22/2019 02/21/2019 02/15/2018 01/24/2017 01/20/2017 01/12/2017  Does Patient Have a Medical Advance Directive? _1  No No  Would patient like information on creating a medical advance directive? Yes (MAU/Ambulatory/Procedural Areas - Information given) No - Patient declined No - Patient declined Yes (MAU/Ambulatory/Procedural Areas - Information given) - - -    Current Medications (verified) Outpatient Encounter Medications as of 05/11/2021  Medication Sig   Alcohol Swabs (B-D SINGLE USE SWABS REGULAR) PADS  Use as instructed to check blood sugar twice daily.  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   aspirin EC 81 MG tablet Take 81 mg by mouth daily.   Blood Glucose Calibration (TRUE METRIX LEVEL 2) Normal SOLN Use as instructed to check blood sugar meter.  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   DROPLET INSULIN SYRINGE 30G X 5/16" 0.5 ML MISC USE TO INJECT INSULIN TWICE DAILY AS DIRECTED   enalapril (VASOTEC) 20 MG tablet TAKE 1 TABLET EVERY DAY   gabapentin (NEURONTIN) 400 MG capsule Take 1 capsule (400 mg total) by mouth at bedtime.   glucose blood (TRUE METRIX BLOOD GLUCOSE TEST) test strip Use as instructed to check blood sugar twice daily.  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   insulin NPH-regular Human (NOVOLIN 70/30) (70-30) 100 UNIT/ML injection 46 units in AM and 36 units in PM   loratadine (CLARITIN) 10 MG tablet Take 1 tablet (10 mg total) by mouth daily.   metFORMIN (GLUCOPHAGE) 1000 MG tablet TAKE 1 TABLET TWICE DAILY WITH MEALS   nystatin (MYCOSTATIN/NYSTOP) powder Apply 1 application topically 3 (three) times daily.   omeprazole (PRILOSEC) 20 MG capsule Take 20 mg by mouth daily.   Probiotic Product (ALIGN) 4 MG CAPS Take 1 capsule by mouth daily.   propranolol (INDERAL) 20 MG tablet Take 1 tablet (20 mg total) by mouth 2 (two) times daily. PLEASE SCHEDULE OFFICE VISIT FOR FURTHER REFILLS. THANK YOU!   rosuvastatin (CRESTOR) 10 MG tablet TAKE 1 TABLET EVERY DAY (PLEASE CALL AND  SCHEDULE YOUR 12 MONTH FOLLOW UP. [phone])   TRUEplus Lancets 33G MISC Use as instructed to check blood sugar twice daily.  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   No facility-administered encounter medications on file as of 05/11/2021.    Allergies (verified) Milk-related compounds, Peanut-containing drug products, and Statins   History: Past Medical History:   Diagnosis Date   Allergy    Asthma    in childhood   Benign tumor of pelvic bones, sacrum, and coccyx    Blood in stool    Chest pain    a. 07/2017 MV: EF 63%, no ischemia/infarct. GI update artifact.   Diabetes mellitus    dx'd at age ~17   GERD (gastroesophageal reflux disease)    Hx of colonic polyps    Hyperlipidemia    Hypertension    dx'd at age ~59   Morbid obesity ([place])    Sleep apnea    on CPAP   Tremor    only in face; worse under periods of stress. father with tremor also   Past Surgical History:  Procedure Laterality Date   [place]   Surgery included removal of benign pelvic tumor   COLONOSCOPY  01/20/2017   LAPAROSCOPIC APPENDECTOMY N/A 10/30/2015   Procedure: APPENDECTOMY LAPAROSCOPIC, lysis of adhesions;  Surgeon: Hubbard Robinson, MD;  Location: [facility] ORS;  Service: General;  Laterality: N/A;   OVARY BIOPSY  1988   pelvic bone tumor  [place]   UPPER GI ENDOSCOPY  01/20/2017   Family History  Problem Relation Age of Onset   Heart disease Mother        h/o valve replacement, no h/o MI   Colon cancer Father    Colon cancer Sister        metastatic (liver?) cancer   Uterine cancer Sister    Lung cancer Maternal Grandfather    Breast cancer Neg Hx    Social History   Socioeconomic History   Marital status: Widowed    Spouse name: Not on file   Number of children: Not on file   Years of education: Not on file   Highest education level: Not on file  Occupational History   Occupation: Payroll Group Lead    Employer: [employer]  Tobacco Use   Smoking status: Never   Smokeless tobacco: Never  Vaping Use   Vaping Use: Never used  Substance and Sexual Activity   Alcohol use: No   Drug use: No   Sexual activity: Not on file  Other Topics Concern   Not on file  Social History Narrative   Widowed 2022, was married 1970 to [employer]      Education:  12th grade   Retired from [employer].    Enjoys playing piano (plays for church), time at church, time with grandkids   2 kids   Social Determinants of Health   Financial Resource Strain: Low Risk    Difficulty of Paying Living Expenses: Not hard at all  Food Insecurity: No Food Insecurity   Worried About Charity fundraiser in the Last Year: Never true   Arboriculturist in the Last Year: Never true  Transportation Needs: No Transportation Needs   Lack of Transportation (Medical): No   Lack of Transportation (Non-Medical): No  Physical Activity: Inactive   Days of Exercise per Week: 0 days   Minutes of Exercise per Session: 0 min  Stress: Stress Concern Present  Feeling of Stress : To some extent  Social Connections: Moderately Integrated   Frequency of Communication with Friends and Family: More than three times a week   Frequency of Social Gatherings with Friends and Family: Three times a week   Attends Religious Services: More than 4 times per year   Active Member of Clubs or Organizations: Yes   Attends Archivist Meetings: More than 4 times per year   Marital Status: Widowed    Tobacco Counseling Counseling given: Not Answered   Clinical Intake:  Pre-visit preparation completed: Yes  Pain : No/denies pain     BMI - recorded: 37.42 Nutritional Status: BMI > 30  Obese Nutritional Risks: None Diabetes: Yes CBG done?: No Did pt. bring in CBG monitor from home?: No  How often do you need to have someone help you when you read instructions, pamphlets, or other written materials from your doctor or pharmacy?: 1 - Never  Diabetes:  Is the patient diabetic?  Yes  If diabetic, was a CBG obtained today?  Yes , patient chect at home. BS 101 this morning.  Did the patient bring in their glucometer from home?  No  How often do you monitor your CBG's? 1 time a day.   Financial Strains and Diabetes Management:  Are you having any financial strains with the device, your supplies or your medication?  No .  Does the patient want to be seen by Chronic Care Management for management of their diabetes?  No  Would the patient like to be referred to a Nutritionist or for Diabetic Management?  No   Diabetic Exams:  Diabetic Eye Exam:  Overdue for diabetic eye exam. Pt has been advised about the importance in completing this exam.  Diabetic Foot Exam:  Pt has an upcoming appointment with the PCP.   Interpreter Needed?: No  Information entered by :: Orrin Brigham LPN   Activities of Daily Living In your present state of health, do you have any difficulty performing the following activities: 05/11/2021  Hearing? N  Vision? N  Difficulty concentrating or making decisions? N  Walking or climbing stairs? N  Dressing or bathing? N  Doing errands, shopping? N  Preparing Food and eating ? N  Using the Toilet? N  In the past six months, have you accidently leaked urine? N  Do you have problems with loss of bowel control? N  Managing your Medications? N  Managing your Finances? N  Housekeeping or managing your Housekeeping? N  Some recent data might be hidden    Patient Care Team: [name], MD as PCP - General (Family Medicine) Rockey Situ Kathlene November, MD as PCP - Cardiology (Cardiology) [name], [facility] as Pharmacist (Pharmacist)  Indicate any recent Medical Services you may have received from other than [facility] providers in the past year (date may be approximate).     Assessment:   This is a routine wellness examination for [place].  Hearing/Vision screen Hearing Screening - Comments:: No issues  Vision Screening - Comments:: Last exam over a year ago, plans to make an appointment, [place].   Dietary issues and exercise activities discussed: Current Exercise Habits: The patient does not participate in regular exercise at present (walks in stores)   Goals Addressed             This Visit's Progress    Patient Stated       Would like to lose weight        Depression Screen  PHQ 2/9 Scores 05/11/2021 02/21/2019 02/15/2018 01/24/2017  PHQ - 2 Score 1 0 0 0  PHQ- 9 Score - 0 0 0    Fall Risk Fall Risk  05/11/2021 02/21/2019 02/15/2018 01/24/2017  Falls in the past year? 0 0 No No  Number falls in past yr: 0 0 - -  Injury with Fall? 0 0 - -  Risk for fall due to : Other (Comment) Medication side effect - -  Risk for fall due to: Comment fell after getting off lawn mower - - -  Follow up Falls prevention discussed Falls evaluation completed;Falls prevention discussed - -    FALL RISK PREVENTION PERTAINING TO THE HOME:  Any stairs in or around the home? Yes  If so, are there any without handrails? No  Home free of loose throw rugs in walkways, pet beds, electrical cords, etc? Yes  Adequate lighting in your home to reduce risk of falls? Yes   ASSISTIVE DEVICES UTILIZED TO PREVENT FALLS:  Life alert? No  Use of a cane, walker or w/c? No  Grab bars in the bathroom? Yes  Shower chair or bench in shower? No  Elevated toilet seat or a handicapped toilet? Yes   TIMED UP AND GO:  Was the test performed? No .   Cognitive Function: Normal cognitive status assessed by this Nurse Health Advisor. No abnormalities found.   MMSE - Mini Mental State Exam 02/21/2019 02/15/2018 01/24/2017  Orientation to time _0 Orientation to Place _1 Registration _2 Attention/ Calculation 5 0 0  Recall _3 Language- name 2 objects - 0 0  Language- repeat _4 Language- follow 3 step command - 3 3  Language- read & follow direction - 0 0  Write a sentence - 0 0  Copy design - 0 0  Total score - 20 20        Immunizations Immunization History  Administered Date(s) Administered   Fluad Quad(high Dose 65+) 02/21/2019, 02/19/2020   Influenza Split 02/15/2011   Influenza Whole 02/18/2013   Influenza,inj,Quad PF,6+ Mos 05/29/2015, 02/02/2016, 01/27/2017, 02/15/2018   Moderna Sars-Covid-2 Vaccination 05/31/2019, 06/28/2019   Pneumococcal  Conjugate-13 11/13/2013   Pneumococcal Polysaccharide-23 04/18/2008, 08/31/2015   Tdap 08/22/2019    TDAP status: Up to date  Flu Vaccine status: Due, Education has been provided regarding the importance of this vaccine. Advised may receive this vaccine at local pharmacy or Health Dept. Aware to provide a copy of the vaccination record if obtained from local pharmacy or Health Dept. Verbalized acceptance and understanding.  Pneumococcal vaccine status: Up to date  Covid-19 vaccine status: Information provided on how to obtain vaccines.   Qualifies for Shingles Vaccine? Yes   Zostavax completed No   Shingrix Completed?: No.    Education has been provided regarding the importance of this vaccine. Patient has been advised to call insurance company to determine out of pocket expense if they have not yet received this vaccine. Advised may also receive vaccine at local pharmacy or Health Dept. Verbalized acceptance and understanding.  Screening Tests Health Maintenance  Topic Date Due   Zoster Vaccines- Shingrix (1 of 2) Never done   OPHTHALMOLOGY EXAM  12/22/2018   COVID-19 Vaccine (3 - Booster for Moderna series) 08/23/2019   HEMOGLOBIN A1C  05/07/2020   FOOT EXAM  11/04/2020   INFLUENZA VACCINE  11/16/2020   COLONOSCOPY (Pts 45-74yr Insurance coverage will need to be confirmed)  01/20/2022  MAMMOGRAM  01/07/2023   TETANUS/TDAP  08/21/2029   Pneumonia Vaccine 57+ Years old  Completed   DEXA SCAN  Completed   Hepatitis C Screening  Completed   HPV VACCINES  Aged Out    Health Maintenance  Health Maintenance Due  Topic Date Due   Zoster Vaccines- Shingrix (1 of 2) Never done   OPHTHALMOLOGY EXAM  12/22/2018   COVID-19 Vaccine (3 - Booster for Moderna series) 08/23/2019   HEMOGLOBIN A1C  05/07/2020   FOOT EXAM  11/04/2020   INFLUENZA VACCINE  11/16/2020    Colorectal cancer screening: Type of screening: Colonoscopy. Completed 01/20/17. Repeat every 5 years  Mammogram  status: Completed 01/06/21. Repeat every year  Bone Density status: Ordered 05/11/21. Pt provided with contact info and advised to call to schedule appt.  Lung Cancer Screening: (Low Dose CT Chest recommended if Age 57-80 years, 30 pack-year currently smoking OR have quit w/in 15years.) does qualify.     Additional Screening:  Hepatitis C Screening: does qualify; Completed 05/29/15  Vision Screening: Recommended annual ophthalmology exams for early detection of glaucoma and other disorders of the eye. Is the patient up to date with their annual eye exam?  No  Who is the provider or what is the name of the office in which the patient attends annual eye exams? [place] Screening: Recommended annual dental exams for proper oral hygiene  Community Resource Referral / Chronic Care Management: CRR required this visit?  No   CCM required this visit?  No      Plan:     I have personally reviewed and noted the following in the patients chart:   Medical and social history Use of alcohol, tobacco or illicit drugs  Current medications and supplements including opioid prescriptions.  Functional ability and status Nutritional status Physical activity Advanced directives List of other physicians Hospitalizations, surgeries, and ER visits in previous 12 months Vitals Screenings to include cognitive, depression, and falls Referrals and appointments  In addition, I have reviewed and discussed with patient certain preventive protocols, quality metrics, and best practice recommendations. A written personalized care plan for preventive services as well as general preventive health recommendations were provided to patient.   Due to this being a telephonic visit, the after visit summary with patients personalized plan was offered to patient via mail or my-chart. Patient preferred to pick up at office at next visit.   Loma Messing, LPN   [id number]   Nurse Health  Advisor  Nurse Notes: none

## 2021-05-11 ENCOUNTER — Ambulatory Visit (INDEPENDENT_AMBULATORY_CARE_PROVIDER_SITE_OTHER): Payer: Medicare HMO

## 2021-05-11 VITALS — Ht 64.0 in | Wt 218.0 lb

## 2021-05-11 DIAGNOSIS — Z0000 Encounter for general adult medical examination without abnormal findings: Secondary | ICD-10-CM | POA: Diagnosis not present

## 2021-05-11 DIAGNOSIS — Z780 Asymptomatic menopausal state: Secondary | ICD-10-CM | POA: Diagnosis not present

## 2021-05-11 NOTE — Patient Instructions (Signed)
Alexis [name] , Thank you for taking time to complete your Medicare Wellness Visit. I appreciate your ongoing commitment to your health goals. Please review the following plan we discussed and let me know if I can assist you in the future.   Screening recommendations/referrals: Colonoscopy: up to date, completed 01/20/17, due 01/20/22 Mammogram: up to date , completed 01/06/21, due 01/06/22 Bone Density: due, last completed 03/06/17, ordered today someone will call to schedule an appointment. Recommended yearly ophthalmology/optometry visit for glaucoma screening and checkup Recommended yearly dental visit for hygiene and checkup  Vaccinations: Influenza vaccine: Due-May obtain vaccine at our office or your local pharmacy. Pneumococcal vaccine: up to date Tdap vaccine: up to date, completed 08/22/19, due 08/21/29 Shingles vaccine: Discuss with pharmacy   Covid-19:newest booster available   Advanced directives: information available at your next appointment  Conditions/risks identified: see problem list  Next appointment: Follow up in one year for your annual wellness visit 05/12/22 @ 12:00pm, this will be a telephone visit.    Preventive Care 31 Years and Older, Female Preventive care refers to lifestyle choices and visits with your health care provider that can promote health and wellness. What does preventive care include? A yearly physical exam. This is also called an annual well check. Dental exams once or twice a year. Routine eye exams. Ask your health care provider how often you should have your eyes checked. Personal lifestyle choices, including: Daily care of your teeth and gums. Regular physical activity. Eating a healthy diet. Avoiding tobacco and drug use. Limiting alcohol use. Practicing safe sex. Taking low-dose aspirin every day. Taking vitamin and mineral supplements as recommended by your health care provider. What happens during an annual well check? The services and  screenings done by your health care provider during your annual well check will depend on your age, overall health, lifestyle risk factors, and family history of disease. Counseling  Your health care provider may ask you questions about your: Alcohol use. Tobacco use. Drug use. Emotional well-being. Home and relationship well-being. Sexual activity. Eating habits. History of falls. Memory and ability to understand (cognition). Work and work Statistician. Reproductive health. Screening  You may have the following tests or measurements: Height, weight, and BMI. Blood pressure. Lipid and cholesterol levels. These may be checked every 5 years, or more frequently if you are over 58 years old. Skin check. Lung cancer screening. You may have this screening every year starting at age 1 if you have a 30-pack-year history of smoking and currently smoke or have quit within the past 15 years. Fecal occult blood test (FOBT) of the stool. You may have this test every year starting at age 2. Flexible sigmoidoscopy or colonoscopy. You may have a sigmoidoscopy every 5 years or a colonoscopy every 10 years starting at age 46. Hepatitis C blood test. Hepatitis B blood test. Sexually transmitted disease (STD) testing. Diabetes screening. This is done by checking your blood sugar (glucose) after you have not eaten for a while (fasting). You may have this done every 1-3 years. Bone density scan. This is done to screen for osteoporosis. You may have this done starting at age 48. Mammogram. This may be done every 1-2 years. Talk to your health care provider about how often you should have regular mammograms. Talk with your health care provider about your test results, treatment options, and if necessary, the need for more tests. Vaccines  Your health care provider may recommend certain vaccines, such as: Influenza vaccine. This is recommended every  year. Tetanus, diphtheria, and acellular pertussis (Tdap,  Td) vaccine. You may need a Td booster every 10 years. Zoster vaccine. You may need this after age 79. Pneumococcal 13-valent conjugate (PCV13) vaccine. One dose is recommended after age 21. Pneumococcal polysaccharide (PPSV23) vaccine. One dose is recommended after age 60. Talk to your health care provider about which screenings and vaccines you need and how often you need them. This information is not intended to replace advice given to you by your health care provider. Make sure you discuss any questions you have with your health care provider. Document Released: 05/01/2015 Document Revised: 12/23/2015 Document Reviewed: 02/03/2015 Elsevier Interactive Patient Education  2017 [place] Prevention in the Home Falls can cause injuries. They can happen to people of all ages. There are many things you can do to make your home safe and to help prevent falls. What can I do on the outside of my home? Regularly fix the edges of walkways and driveways and fix any cracks. Remove anything that might make you trip as you walk through a door, such as a raised step or threshold. Trim any bushes or trees on the path to your home. Use bright outdoor lighting. Clear any walking paths of anything that might make someone trip, such as rocks or tools. Regularly check to see if handrails are loose or broken. Make sure that both sides of any steps have handrails. Any raised decks and porches should have guardrails on the edges. Have any leaves, snow, or ice cleared regularly. Use sand or salt on walking paths during winter. Clean up any spills in your garage right away. This includes oil or grease spills. What can I do in the bathroom? Use night lights. Install grab bars by the toilet and in the tub and shower. Do not use towel bars as grab bars. Use non-skid mats or decals in the tub or shower. If you need to sit down in the shower, use a plastic, non-slip stool. Keep the floor dry. Clean up any  water that spills on the floor as soon as it happens. Remove soap buildup in the tub or shower regularly. Attach bath mats securely with double-sided non-slip rug tape. Do not have throw rugs and other things on the floor that can make you trip. What can I do in the bedroom? Use night lights. Make sure that you have a light by your bed that is easy to reach. Do not use any sheets or blankets that are too big for your bed. They should not hang down onto the floor. Have a firm chair that has side arms. You can use this for support while you get dressed. Do not have throw rugs and other things on the floor that can make you trip. What can I do in the kitchen? Clean up any spills right away. Avoid walking on wet floors. Keep items that you use a lot in easy-to-reach places. If you need to reach something above you, use a strong step stool that has a grab bar. Keep electrical cords out of the way. Do not use floor polish or wax that makes floors slippery. If you must use wax, use non-skid floor wax. Do not have throw rugs and other things on the floor that can make you trip. What can I do with my stairs? Do not leave any items on the stairs. Make sure that there are handrails on both sides of the stairs and use them. Fix handrails that are broken or  loose. Make sure that handrails are as long as the stairways. Check any carpeting to make sure that it is firmly attached to the stairs. Fix any carpet that is loose or worn. Avoid having throw rugs at the top or bottom of the stairs. If you do have throw rugs, attach them to the floor with carpet tape. Make sure that you have a light switch at the top of the stairs and the bottom of the stairs. If you do not have them, ask someone to add them for you. What else can I do to help prevent falls? Wear shoes that: Do not have high heels. Have rubber bottoms. Are comfortable and fit you well. Are closed at the toe. Do not wear sandals. If you use a  stepladder: Make sure that it is fully opened. Do not climb a closed stepladder. Make sure that both sides of the stepladder are locked into place. Ask someone to hold it for you, if possible. Clearly mark and make sure that you can see: Any grab bars or handrails. First and last steps. Where the edge of each step is. Use tools that help you move around (mobility aids) if they are needed. These include: Canes. Walkers. Scooters. Crutches. Turn on the lights when you go into a dark area. Replace any light bulbs as soon as they burn out. Set up your furniture so you have a clear path. Avoid moving your furniture around. If any of your floors are uneven, fix them. If there are any pets around you, be aware of where they are. Review your medicines with your doctor. Some medicines can make you feel dizzy. This can increase your chance of falling. Ask your doctor what other things that you can do to help prevent falls. This information is not intended to replace advice given to you by your health care provider. Make sure you discuss any questions you have with your health care provider. Document Released: 01/29/2009 Document Revised: 09/10/2015 Document Reviewed: 05/09/2014 Elsevier Interactive Patient Education  2017 [employer].

## 2021-05-18 ENCOUNTER — Other Ambulatory Visit: Payer: Medicare HMO

## 2021-05-25 ENCOUNTER — Encounter: Payer: Medicare HMO | Admitting: Family Medicine

## 2021-05-26 ENCOUNTER — Telehealth: Payer: Self-pay

## 2021-05-26 NOTE — Progress Notes (Signed)
° ° °  Chronic Care Management Pharmacy Assistant   Name: SAIDAH KEMPTON  MRN: [id number] DOB: [birth year]  Reason for Encounter: CCM (Appointment Reminder)   Medications: Outpatient Encounter Medications as of 05/26/2021  Medication Sig   Alcohol Swabs (B-D SINGLE USE SWABS REGULAR) PADS Use as instructed to check blood sugar twice daily.  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   aspirin EC 81 MG tablet Take 81 mg by mouth daily.   Blood Glucose Calibration (TRUE METRIX LEVEL 2) Normal SOLN Use as instructed to check blood sugar meter.  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   DROPLET INSULIN SYRINGE 30G X 5/16" 0.5 ML MISC USE TO INJECT INSULIN TWICE DAILY AS DIRECTED   enalapril (VASOTEC) 20 MG tablet TAKE 1 TABLET EVERY DAY   gabapentin (NEURONTIN) 400 MG capsule Take 1 capsule (400 mg total) by mouth at bedtime.   glucose blood (TRUE METRIX BLOOD GLUCOSE TEST) test strip Use as instructed to check blood sugar twice daily.  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   insulin NPH-regular Human (NOVOLIN 70/30) (70-30) 100 UNIT/ML injection 46 units in AM and 36 units in PM   loratadine (CLARITIN) 10 MG tablet Take 1 tablet (10 mg total) by mouth daily.   metFORMIN (GLUCOPHAGE) 1000 MG tablet TAKE 1 TABLET TWICE DAILY WITH MEALS   nystatin (MYCOSTATIN/NYSTOP) powder Apply 1 application topically 3 (three) times daily.   omeprazole (PRILOSEC) 20 MG capsule Take 20 mg by mouth daily.   Probiotic Product (ALIGN) 4 MG CAPS Take 1 capsule by mouth daily.   propranolol (INDERAL) 20 MG tablet Take 1 tablet (20 mg total) by mouth 2 (two) times daily. PLEASE SCHEDULE OFFICE VISIT FOR FURTHER REFILLS. THANK YOU!   rosuvastatin (CRESTOR) 10 MG tablet TAKE 1 TABLET EVERY DAY (PLEASE CALL AND SCHEDULE YOUR 12 MONTH FOLLOW UP. [phone])   TRUEplus Lancets 33G MISC Use as  instructed to check blood sugar twice daily.  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   No facility-administered encounter medications on file as of 05/26/2021.   Alexis [name] was contacted to remind of upcoming telephone visit with [name] on 05/31/2021 at 9:30 am. Patient was reminded to have all medications, supplements and any blood glucose and blood pressure readings available for review at appointment. If unable to reach, a voicemail was left for patient.   Are you having any problems with your medications? No   Do you have any concerns you like to discuss with the pharmacist? No  Star Rating Drugs: Medication:  Last Fill: Day Supply Metformin 1000 mg 03/10/2021 90   Enalapril 20 mg 03/10/2021 90  Rosuvastatin 10 mg 03/19/2021 [place], CPP notified  Marijean Niemann, [place] Pharmacy Assistant [phone]  Time Spent: 10 Minutes

## 2021-05-28 ENCOUNTER — Other Ambulatory Visit: Payer: Medicare HMO

## 2021-05-31 ENCOUNTER — Telehealth: Payer: Medicare HMO

## 2021-05-31 ENCOUNTER — Telehealth: Payer: Self-pay

## 2021-05-31 NOTE — Telephone Encounter (Signed)
°  Chronic Care Management   Outreach Note  05/31/2021 Name: Alexis [name] MRN: [id number] DOB: 1950/09/10  Referred by: [name], MD Reason for referral : Chronic Care Management (Follow up)  Patient had a phone appointment scheduled with clinical pharmacist today. Due to CCM guidance, pt has not seen PCP for 2 conditions in past 12 months, appt was rescheduled for June 2023. Patient has PCP visit tomorrow. She denies and medication or health concerns today.  [name], PharmD Clinical Pharmacist Practitioner [facility] [phone]

## 2021-06-01 ENCOUNTER — Ambulatory Visit (INDEPENDENT_AMBULATORY_CARE_PROVIDER_SITE_OTHER): Payer: Medicare HMO | Admitting: Family Medicine

## 2021-06-01 ENCOUNTER — Encounter: Payer: Self-pay | Admitting: Family Medicine

## 2021-06-01 ENCOUNTER — Other Ambulatory Visit: Payer: Self-pay

## 2021-06-01 VITALS — BP 120/80 | HR 84 | Temp 98.2°F | Ht 64.0 in | Wt 219.0 lb

## 2021-06-01 DIAGNOSIS — R251 Tremor, unspecified: Secondary | ICD-10-CM

## 2021-06-01 DIAGNOSIS — Z23 Encounter for immunization: Secondary | ICD-10-CM | POA: Diagnosis not present

## 2021-06-01 DIAGNOSIS — Z794 Long term (current) use of insulin: Secondary | ICD-10-CM | POA: Diagnosis not present

## 2021-06-01 DIAGNOSIS — Z0000 Encounter for general adult medical examination without abnormal findings: Secondary | ICD-10-CM

## 2021-06-01 DIAGNOSIS — E1140 Type 2 diabetes mellitus with diabetic neuropathy, unspecified: Secondary | ICD-10-CM

## 2021-06-01 DIAGNOSIS — E7800 Pure hypercholesterolemia, unspecified: Secondary | ICD-10-CM

## 2021-06-01 DIAGNOSIS — Z7189 Other specified counseling: Secondary | ICD-10-CM

## 2021-06-01 LAB — CBC WITH DIFFERENTIAL/PLATELET
Basophils Absolute: 0 10*3/uL (ref 0.0–0.1)
Basophils Relative: 0.5 % (ref 0.0–3.0)
Eosinophils Absolute: 0.4 10*3/uL (ref 0.0–0.7)
Eosinophils Relative: 3.9 % (ref 0.0–5.0)
HCT: 41 % (ref 36.0–46.0)
Hemoglobin: 13.2 g/dL (ref 12.0–15.0)
Lymphocytes Relative: 25.8 % (ref 12.0–46.0)
Lymphs Abs: 2.6 10*3/uL (ref 0.7–4.0)
MCHC: 32.2 g/dL (ref 30.0–36.0)
MCV: 84 fl (ref 78.0–100.0)
Monocytes Absolute: 0.6 10*3/uL (ref 0.1–1.0)
Monocytes Relative: 5.8 % (ref 3.0–12.0)
Neutro Abs: 6.5 10*3/uL (ref 1.4–7.7)
Neutrophils Relative %: 64 % (ref 43.0–77.0)
Platelets: 295 10*3/uL (ref 150.0–400.0)
RBC: 4.88 Mil/uL (ref 3.87–5.11)
RDW: 14.7 % (ref 11.5–15.5)
WBC: 10.1 10*3/uL (ref 4.0–10.5)

## 2021-06-01 LAB — HEMOGLOBIN A1C: Hgb A1c MFr Bld: 8.9 % — ABNORMAL HIGH (ref 4.6–6.5)

## 2021-06-01 MED ORDER — METFORMIN HCL 1000 MG PO TABS: 1000.0000 mg | ORAL_TABLET | Freq: Two times a day (BID) | ORAL | 3 refills | Status: DC

## 2021-06-01 MED ORDER — PROPRANOLOL HCL 20 MG PO TABS: 20.0000 mg | ORAL_TABLET | Freq: Two times a day (BID) | ORAL | 3 refills | Status: DC

## 2021-06-01 MED ORDER — ENALAPRIL MALEATE 20 MG PO TABS: 20.0000 mg | ORAL_TABLET | Freq: Every day | ORAL | 3 refills | Status: DC

## 2021-06-01 MED ORDER — GABAPENTIN 400 MG PO CAPS: 400.0000 mg | ORAL_CAPSULE | Freq: Every day | ORAL | 3 refills | Status: DC

## 2021-06-01 NOTE — Progress Notes (Signed)
This visit occurred during the SARS-CoV-2 public health emergency.  Safety protocols were in place, including screening questions prior to the visit, additional usage of staff PPE, and extensive cleaning of exam room while observing appropriate contact time as indicated for disinfecting solutions.  Her husband passed away, d/w pt.  She is going to her granddaughter's wedding soon.  She had a greatgrandson due in May.  We talked about all of that.  She is adjusting to the change but waking in the middle of the night.  She can get by as is.  She is going to update me as needed.  Tremor.  Still on propranolol.  No ADE on med.  Compliant.  Tremor is manageable.    Diabetes:  Using medications without difficulties: yes Hypoglycemic episodes:rare, cautions d/w pt.  She is taking a snack at night.   Hyperglycemic episodes:no Feet problems: improved with gabapentin at night.   Blood Sugars averaging: usually ~80-110 eye exam within last year: due, d/w pt.   Elevated Cholesterol: Using medications without problems: some aches, unclear if from statin.   Muscle aches: see above.   Diet compliance: d/w pt.  Exercise: d/w pt    Flu vaccine 2023 Shingles d/w pt.   Tetanus 2021 PNA up to date.  Covid prev done.   Colonoscopy done 2018.    EGD 2018.   Mammogram 2022 DXA pending 2023 Pap not due.   Advance care planning- daughter Vilinda Boehringer designated if patient were incapacitated.    Meds, vitals, and allergies reviewed.   ROS: Per HPI unless specifically indicated in ROS section   GEN: nad, alert and oriented HEENT: ncat NECK: supple w/o LA CV: rrr. PULM: ctab, no inc wob ABD: soft, +bs EXT: trace BLE edema SKIN: no acute rash Head tremor noted, mild.  Speech normal.  Diabetic foot exam: Normal inspection No skin breakdown No calluses  Normal DP pulses Normal sensation to light touch and monofilament Nails normal

## 2021-06-01 NOTE — Patient Instructions (Addendum)
Go to the lab on the way out.   If you have mychart we'll likely use that to update you.    Take care.  Glad to see you. Try stopping crestor for about 2 weeks and see if the aches get better.  If you have any early morning or late night lows, then cut back on your evening insulin by 1 unit per day.   Call about an eye exam when possible.   If you don't get a call about the bone density test at the end of the month, then let me know.

## 2021-06-02 LAB — COMPREHENSIVE METABOLIC PANEL
ALT: 8 U/L (ref 0–35)
AST: 14 U/L (ref 0–37)
Albumin: 4.1 g/dL (ref 3.5–5.2)
Alkaline Phosphatase: 90 U/L (ref 39–117)
BUN: 11 mg/dL (ref 6–23)
CO2: 34 mEq/L — ABNORMAL HIGH (ref 19–32)
Calcium: 10.2 mg/dL (ref 8.4–10.5)
Chloride: 100 mEq/L (ref 96–112)
Creatinine, Ser: 0.73 mg/dL (ref 0.40–1.20)
GFR: 83 mL/min (ref >60.00)
Glucose, Bld: 178 mg/dL — ABNORMAL HIGH (ref 70–99)
Potassium: 4.8 mEq/L (ref 3.5–5.1)
Sodium: 138 mEq/L (ref 135–145)
Total Bilirubin: 0.4 mg/dL (ref 0.2–1.2)
Total Protein: 7.6 g/dL (ref 6.0–8.3)

## 2021-06-02 LAB — LIPID PANEL
Cholesterol: 130 mg/dL (ref 0–200)
HDL: 32.1 mg/dL — ABNORMAL LOW (ref >39.00)
LDL Cholesterol: 58 mg/dL (ref 0–99)
NonHDL: 97.64
Total CHOL/HDL Ratio: 4
Triglycerides: 197 mg/dL — ABNORMAL HIGH (ref 0.0–149.0)
VLDL: 39.4 mg/dL (ref 0.0–40.0)

## 2021-06-05 NOTE — Assessment & Plan Note (Signed)
Continue work on diet and exercise.  See notes on labs. 

## 2021-06-05 NOTE — Assessment & Plan Note (Signed)
Flu vaccine 2023 Shingles d/w pt.   Tetanus 2021 PNA up to date.  Covid prev done.   Colonoscopy done 2018.    EGD 2018.   Mammogram 2022 DXA pending 2023 Pap not due.   Advance care planning- daughter Vilinda Boehringer designated if patient were incapacitated.

## 2021-06-05 NOTE — Assessment & Plan Note (Signed)
Advance care planning- daughter Vilinda Boehringer designated if patient were incapacitated.

## 2021-06-05 NOTE — Assessment & Plan Note (Signed)
Significant social update below as above, discussed with patient.  She is trying to adjust to a lot of changes.  Would continue gabapentin for now for foot pain at night.  Continue metformin and insulin.  See notes on labs.

## 2021-06-05 NOTE — Assessment & Plan Note (Signed)
Continue propranolol.  She can manage as is.  She will update me as needed.

## 2021-06-08 ENCOUNTER — Other Ambulatory Visit: Payer: Self-pay

## 2021-06-08 ENCOUNTER — Ambulatory Visit: Payer: Medicare HMO | Admitting: Cardiovascular Disease

## 2021-06-08 ENCOUNTER — Encounter: Payer: Self-pay | Admitting: Cardiovascular Disease

## 2021-06-08 VITALS — BP 140/80 | HR 75 | Ht 64.0 in | Wt 219.1 lb

## 2021-06-08 DIAGNOSIS — E1140 Type 2 diabetes mellitus with diabetic neuropathy, unspecified: Secondary | ICD-10-CM

## 2021-06-08 DIAGNOSIS — M791 Myalgia, unspecified site: Secondary | ICD-10-CM | POA: Diagnosis not present

## 2021-06-08 DIAGNOSIS — I2510 Atherosclerotic heart disease of native coronary artery without angina pectoris: Secondary | ICD-10-CM | POA: Diagnosis not present

## 2021-06-08 DIAGNOSIS — E782 Mixed hyperlipidemia: Secondary | ICD-10-CM

## 2021-06-08 DIAGNOSIS — T466X5A Adverse effect of antihyperlipidemic and antiarteriosclerotic drugs, initial encounter: Secondary | ICD-10-CM

## 2021-06-08 DIAGNOSIS — I700 Atherosclerosis of aorta: Secondary | ICD-10-CM | POA: Diagnosis not present

## 2021-06-08 DIAGNOSIS — I10 Essential (primary) hypertension: Secondary | ICD-10-CM | POA: Diagnosis not present

## 2021-06-08 DIAGNOSIS — Z794 Long term (current) use of insulin: Secondary | ICD-10-CM | POA: Diagnosis not present

## 2021-06-08 MED ORDER — EZETIMIBE 10 MG PO TABS: 10.0000 mg | ORAL_TABLET | Freq: Every day | ORAL | 3 refills | Status: DC

## 2021-06-08 MED ORDER — ROSUVASTATIN CALCIUM 10 MG PO TABS: 5.0000 mg | ORAL_TABLET | Freq: Every day | ORAL | 3 refills | Status: DC

## 2021-06-08 NOTE — Patient Instructions (Addendum)
Medication Instructions:  Please START Zetia 10 mg daily Stay on crestor ,  cut in 1/2 daily 5 mg daily  If you need a refill on your cardiac medications before your next appointment, please call your pharmacy.   Lab work: No new labs needed  Testing/Procedures: No new testing needed  Follow-Up: At [facility], you and your health needs are our priority.  As part of our continuing mission to provide you with exceptional heart care, we have created designated Provider Care Teams.  These Care Teams include your primary Cardiologist (physician) and Advanced Practice Providers (APPs -  Physician Assistants and Nurse Practitioners) who all work together to provide you with the care you need, when you need it.  You will need a follow up appointment in 12 months  Providers on your designated Care Team:   [name], NP Christell Faith, PA-C Cadence Kathlen Mody, [place]  COVID-19 Vaccine Information can be found at: [email or web address] For questions related to vaccine distribution or appointments, please email [email or web address] or call [phone].

## 2021-06-08 NOTE — Progress Notes (Signed)
Cardiology Office Note  Date:  06/08/2021   ID:  Alexis [name], DOB December 22, 1950, MRN [id number]  PCP:  Alexis Ghent, MD   Chief Complaint  Patient presents with   12 month follow up     "Doing well." Medications reviewed by the patient verbally.     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, HBA1C in the 8 range Significant aortic atherosclerosis extending through her iliac arteries including aorta and coronaries Presenting for follow-up of her chest pain, PAD, aortic atherosclerosis  LOV 12/2018  Husband passed 03/06/21, renal failure, pulmonary fibrosis Difficult time adjusting  No regular exercise program  Sugars running high, A1c 8.9  EKG personally reviewed by myself on todays visit NSR rate 75 bpm, no ST or T wave changes  Lab work reviewed Total chol 130 LDL 58  Other past medical history reviewed  Stress test 07/2017  Pharmacological myocardial perfusion imaging study with no significant ischemia Non-attenuation corrected images with breast attenuation artifact and GI uptake artifact  GI uptake artifact noted on attenuation corrected images, making it difficult to interpret perfusion in the inferior wall.  Likely normal Normal wall motion, EF estimated at 63% No EKG changes concerning for ischemia at peak stress or in recovery. Low risk scan  Family hx Mother with valve disease, 1, lived until 28, atrial fib Father: sepsis, aspiration  PMH:   has a past medical history of Allergy, Asthma, Benign tumor of pelvic bones, sacrum, and coccyx, Blood in stool, Chest pain, Diabetes mellitus, GERD (gastroesophageal reflux disease), colonic polyps, Hyperlipidemia, Hypertension, Morbid obesity ([place]), Sleep apnea, and Tremor.  PSH:    Past Surgical History:  Procedure Laterality Date   ABDOMINAL HYSTERECTOMY  1988   Surgery included removal of benign pelvic tumor   COLONOSCOPY   01/20/2017   LAPAROSCOPIC APPENDECTOMY N/A 10/30/2015   Procedure: APPENDECTOMY LAPAROSCOPIC, lysis of adhesions;  Surgeon: Hubbard Robinson, MD;  Location: [facility] ORS;  Service: General;  Laterality: N/A;   OVARY BIOPSY  1988   pelvic bone tumor  [facility]  01/20/2017    Current Outpatient Medications  Medication Sig Dispense Refill   Alcohol Swabs (B-D SINGLE USE SWABS REGULAR) PADS Use as instructed to check blood sugar twice daily.  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 200 each 5   aspirin EC 81 MG tablet Take 81 mg by mouth daily.     Blood Glucose Calibration (TRUE METRIX LEVEL 2) Normal SOLN Use as instructed to check blood sugar meter.  Diagnosis:  E11.9  Insulin dependent 1 each 3   Blood Glucose Monitoring Suppl (TRUE METRIX METER) w/Device KIT Use to check blood sugar twice daily.  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 1 kit 0   DROPLET INSULIN SYRINGE 30G X 5/16" 0.5 ML MISC USE TO INJECT INSULIN TWICE DAILY AS DIRECTED 200 each 2   enalapril (VASOTEC) 20 MG tablet Take 1 tablet (20 mg total) by mouth daily. 90 tablet 3   gabapentin (NEURONTIN) 400 MG capsule Take 1 capsule (400 mg total) by mouth at bedtime. 90 capsule 3   glucose blood (TRUE METRIX BLOOD GLUCOSE TEST) test strip Use as instructed to check blood sugar twice daily.  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 200 each 3   insulin NPH-regular Human (NOVOLIN 70/30) (70-30) 100 UNIT/ML injection 46 units in AM and 36 units  in PM 10 mL    loratadine (CLARITIN) 10 MG tablet Take 1 tablet (10 mg total) by mouth daily.     metFORMIN (GLUCOPHAGE) 1000 MG tablet Take 1 tablet (1,000 mg total) by mouth 2 (two) times daily with a meal. 180 tablet 3   nystatin (MYCOSTATIN/NYSTOP) powder Apply 1 application topically 3 (three) times daily. 30 g 1   omeprazole (PRILOSEC) 20 MG capsule Take 20 mg by mouth daily.     Probiotic Product (ALIGN) 4 MG CAPS Take 1 capsule by mouth  daily.     propranolol (INDERAL) 20 MG tablet Take 1 tablet (20 mg total) by mouth 2 (two) times daily. 180 tablet 3   TRUEplus Lancets 33G MISC Use as instructed to check blood sugar twice daily.  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 200 each 3   rosuvastatin (CRESTOR) 10 MG tablet TAKE 1 TABLET EVERY DAY (PLEASE CALL AND SCHEDULE YOUR 12 MONTH FOLLOW UP. [phone]) (Patient not taking: Reported on 06/08/2021) 90 tablet 3   No current facility-administered medications for this visit.     Allergies:   Milk-related compounds, Peanut-containing drug products, and Statins   Social History:  The patient  reports that she has never smoked. She has never used smokeless tobacco. She reports that she does not drink alcohol and does not use drugs.   Family History:   family history includes Colon cancer in her father and sister; Heart disease in her mother; Lung cancer in her maternal grandfather; Uterine cancer in her sister.   Review of Systems: Review of Systems  Constitutional: Negative.   HENT: Negative.    Respiratory: Negative.    Cardiovascular: Negative.   Gastrointestinal: Negative.   Musculoskeletal: Negative.   Neurological: Negative.   Psychiatric/Behavioral: Negative.    All other systems reviewed and are negative.  PHYSICAL EXAM: VS:  BP 140/80 (BP Location: Left Arm, Patient Position: Sitting, Cuff Size: Normal)    Pulse 75    Ht 5' 4"  (1.626 m)    Wt 219 lb 2 oz (99.4 kg)    SpO2 98%    BMI 37.61 kg/m  , BMI Body mass index is 37.61 kg/m. Constitutional:  oriented to person, place, and time. No distress.  HENT:  Head: Grossly normal Eyes:  no discharge. No scleral icterus.  Neck: No JVD, no carotid bruits  Cardiovascular: Regular rate and rhythm, no murmurs appreciated Pulmonary/Chest: Clear to auscultation bilaterally, no wheezes or rails Abdominal: Soft.  no distension.  no tenderness.  Musculoskeletal: Normal range of motion Neurological:  normal muscle tone.  Coordination normal. No atrophy Skin: Skin warm and dry Psychiatric: normal affect, pleasant  Recent Labs: 06/01/2021: ALT 8; BUN 11; Creatinine, Ser 0.73; Hemoglobin 13.2; Platelets 295.0; Potassium 4.8; Sodium 138    Lipid Panel Lab Results  Component Value Date   CHOL 130 06/01/2021   HDL 32.10 (L) 06/01/2021   LDLCALC 58 06/01/2021   TRIG 197.0 (H) 06/01/2021      Wt Readings from Last 3 Encounters:  06/08/21 219 lb 2 oz (99.4 kg)  06/01/21 219 lb (99.3 kg)  05/11/21 218 lb (98.9 kg)     ASSESSMENT AND PLAN:  Type 2 diabetes mellitus with other circulatory complication, with long-term current use of insulin (HCC) CT scan with significant coronary calcification and aortic atherosclerosis extending through her iliac arteries Stressed importance of aggressive diabetes control, walking program, weight loss  Exertional chest pain -CAD with stable angina Recommended walking program for conditioning Denies recent symptoms  of angina  Aortic atherosclerosis (HCC) Significant aortic atherosclerosis extending through her iliac arteries including aorta and coronaries Diabetes is a major issue, A1c still  8 range Diet and walking program discussed  shortness of breath We have encouraged continued exercise, careful diet management in an effort to lose weight.  Mixed hyperlipidemia Muscle issues? Crestor held Suggested restarting crestor 5 mg daily, add zetia 10 mg daily    Total encounter time more than 30 minutes  Greater than 50% was spent in counseling and coordination of care with the patient   No orders of the defined types were placed in this encounter.   Signed, Esmond Plants, M.D., Ph.D. 06/08/2021  [place], [place]

## 2021-06-23 ENCOUNTER — Telehealth: Payer: Self-pay

## 2021-06-23 NOTE — Progress Notes (Signed)
Reason for Encounter: CCM (Transition to Self Care) ?  ?Transition CCM to Self Care ? ?Patient contacted to inform they have achieved their CCM goals and no longer need to be contacted as frequently. Patient advised services will still be available to them if they would like to reach out or have any new health concerns. Verified patient had contact information to pharmacist and health concierge on hand. Patient made aware CCM services would be continued if desired. Patient consented to cancel future CCM appointments. ? ?Charlene Brooke, CPP notified ? ?Marijean Niemann, RMA ?Clinical Pharmacy Assistant ?[phone] ?

## 2021-09-07 ENCOUNTER — Ambulatory Visit (INDEPENDENT_AMBULATORY_CARE_PROVIDER_SITE_OTHER): Payer: Medicare HMO | Admitting: Family Medicine

## 2021-09-07 ENCOUNTER — Encounter: Payer: Self-pay | Admitting: Family Medicine

## 2021-09-07 VITALS — BP 128/60 | HR 75 | Temp 97.2°F | Ht 64.0 in | Wt 218.0 lb

## 2021-09-07 DIAGNOSIS — E1140 Type 2 diabetes mellitus with diabetic neuropathy, unspecified: Secondary | ICD-10-CM

## 2021-09-07 DIAGNOSIS — Z794 Long term (current) use of insulin: Secondary | ICD-10-CM

## 2021-09-07 LAB — POCT GLYCOSYLATED HEMOGLOBIN (HGB A1C): Hemoglobin A1C: 8.2 % — AB (ref 4.0–5.6)

## 2021-09-07 MED ORDER — METFORMIN HCL 1000 MG PO TABS: ORAL_TABLET | ORAL | 3 refills | Status: DC

## 2021-09-07 MED ORDER — INSULIN NPH ISOPHANE & REGULAR (70-30) 100 UNIT/ML ~~LOC~~ SUSP: SUBCUTANEOUS | Status: DC

## 2021-09-07 NOTE — Patient Instructions (Addendum)
Try cutting the metformin back to 1 tab in the AM and 1/2 tab in the PM.  See if that helps with lows in the early AM.  Plan on recheck in about 3 months.  A1c at the visit.  Update me as needed in the meantime.  Take care.  Glad to see you.

## 2021-09-07 NOTE — Progress Notes (Unsigned)
Diabetes:   Using medications without difficulties: yes but she had some lower sugars occ noted, d/w pt.  Hypoglycemic episodes: occ, cautions d/w pt.  She was symptomatic at the time.  Usually in the AM if/when she has a low, so she cut back on evening insulin.   Hyperglycemic episodes: no Feet problems: some edema.  No change in baseline tingling in the feet.   Blood Sugars averaging: usually ~90-100 in the AMs on a normal day, was 84 this AM,  eye exam within last year: has f/u pending for September.   A1c d/w pt at [facility].  A1c improved to 8.2.   She cut back on fried foods and carbs.  She cut back on salt She is tolerating '5mg'$  crestor daily.   Currently taking insulin 42 units in AM and 30 units in PM, '1000mg'$  metformin BID.  D/w pt about metformin dec, down to '1000mg'$  in AM and '500mg'$  PM.    Her great grandchild is due soon, may need to be induced, d/w pt.    She was able to stop her bank account from getting hacked but she wasn't able to use her debit card for two days.  D/w pt.    Meds, vitals, and allergies reviewed.  ROS: Per HPI unless specifically indicated in ROS section   GEN: nad, alert and oriented HEENT: ncat NECK: supple w/o LA CV: rrr. PULM: ctab, no inc wob ABD: soft, +bs EXT: no edema SKIN: no acute rash  Diabetic foot exam: Normal inspection No skin breakdown No calluses  Normal DP pulses Normal sensation to light touch and monofilament Nails normal

## 2021-09-08 NOTE — Assessment & Plan Note (Signed)
Goal to get reasonable sugar control without having hypoglycemia.  Discussed options.  Continue work on diet A1c d/w pt at [facility].  A1c improved to 8.2.   She cut back on fried foods and carbs.  She cut back on salt She is tolerating '5mg'$  crestor daily.   Currently taking insulin 42 units in AM and 30 units in PM, '1000mg'$  metformin BID.  D/w pt about metformin dec, down to '1000mg'$  in AM and '500mg'$  PM.  No change in insulin at this point.  See after visit summary.  Recheck periodically.  She can update me as needed.

## 2021-10-06 ENCOUNTER — Telehealth: Payer: Medicare HMO

## 2021-10-25 IMAGING — CT CT CERVICAL SPINE W/O CM
3 of 4 series · 10 of 33 positions shown, 12 images · non-contrast
Comparison: None.

CLINICAL DATA: Tripped and fell, right shoulder pain, nasal
abrasion

EXAM:
CT CERVICAL SPINE WITHOUT CONTRAST
TECHNIQUE: Multidetector CT imaging of the cervical spine was performed without
intravenous contrast. Multiplanar CT image reconstructions were also
generated.

[Series 6: sagittal bone · sagittal · 0.26mm/px · 5 of 53 slices shown, 6 images]
[im 18/53  bone]
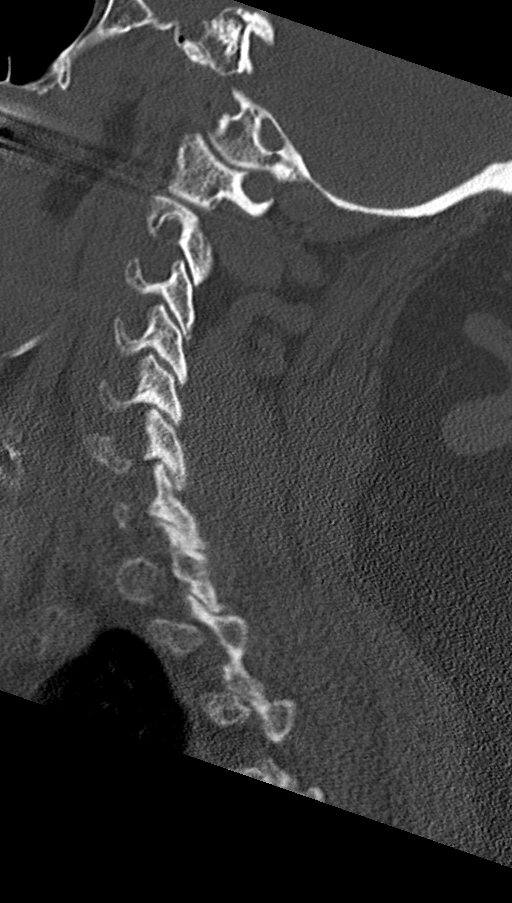
[im 22/53  bone]
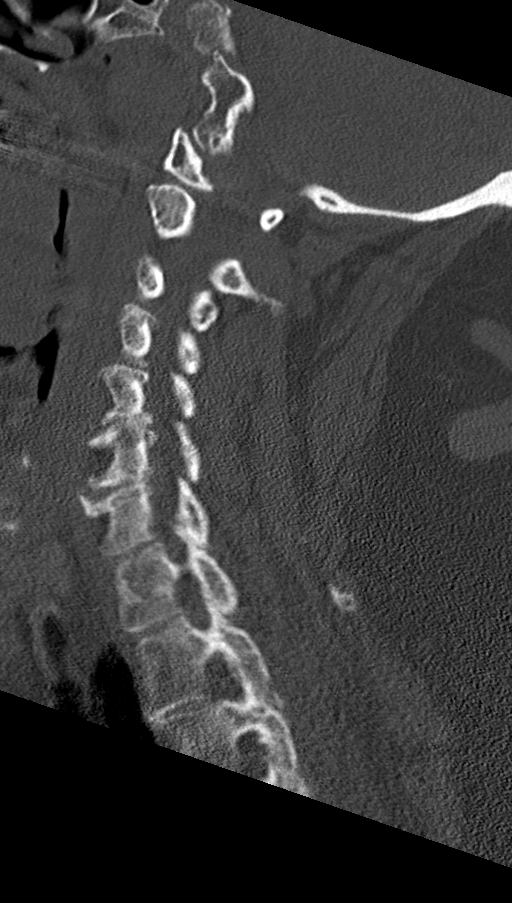
[im 27/53  soft-tissue]
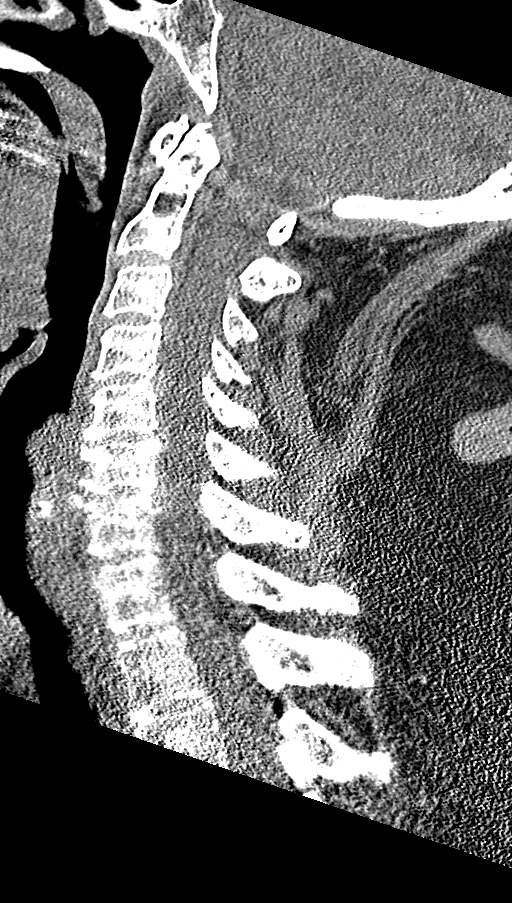
[im 27/53  bone]
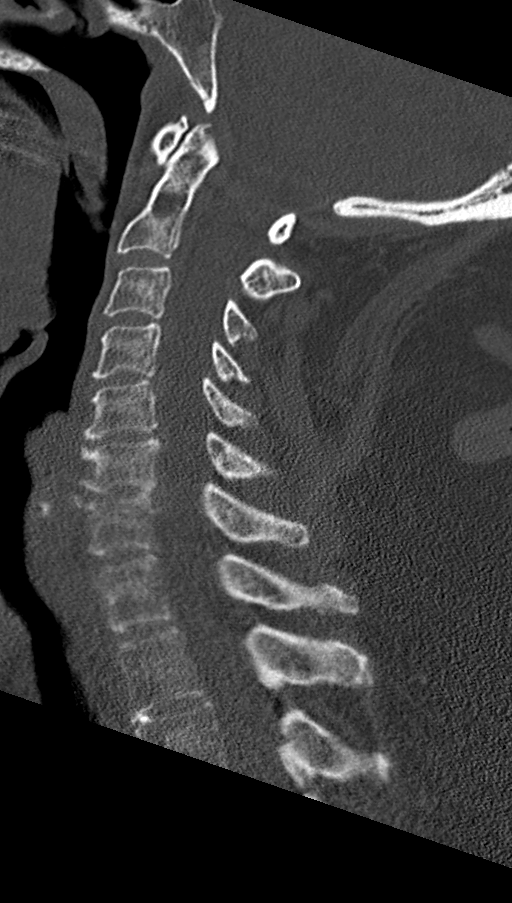
[im 31/53  bone]
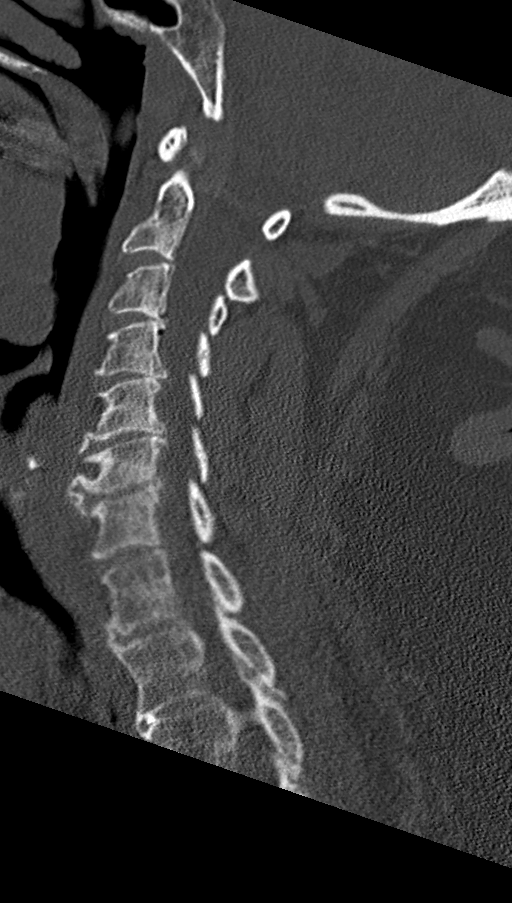
[im 35/53  bone]
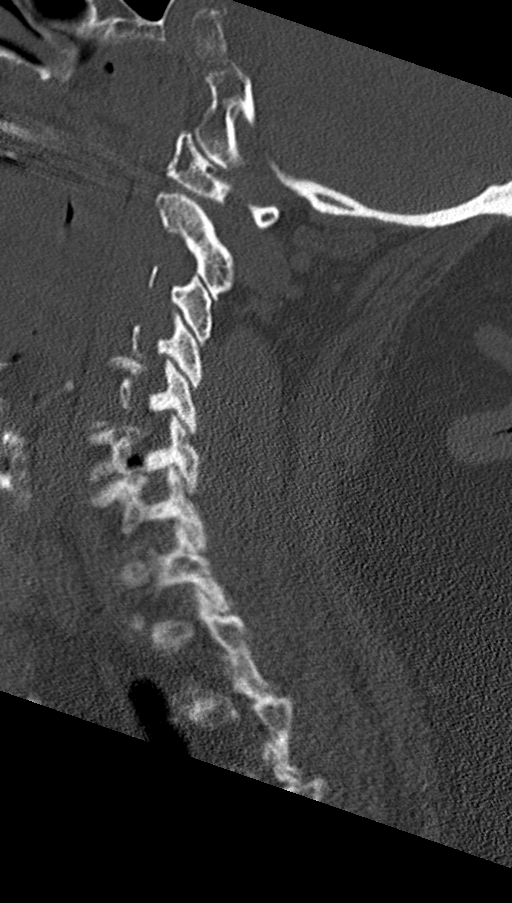

[Series 7: coronal bone · coronal · 0.23mm/px · 3 of 54 slices shown]
[im 11/54  bone]
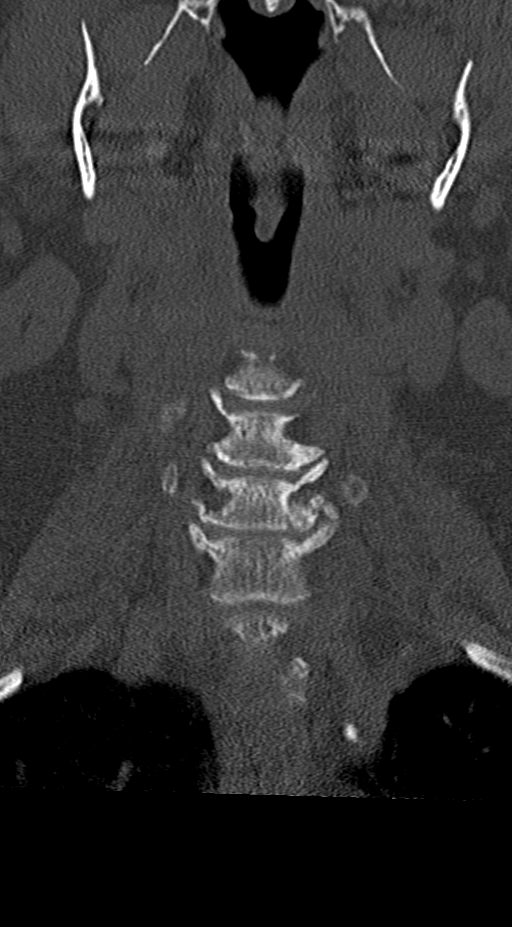
[im 22/54  bone]
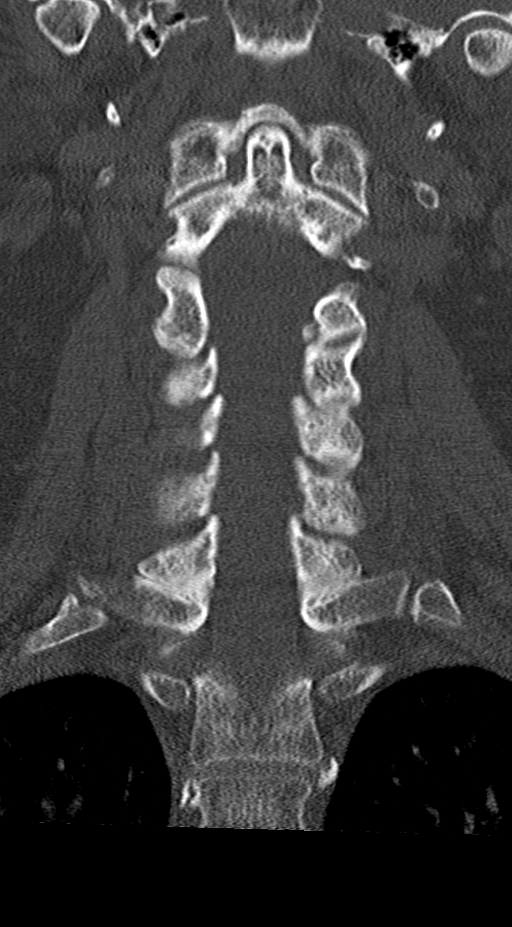
[im 32/54  bone]
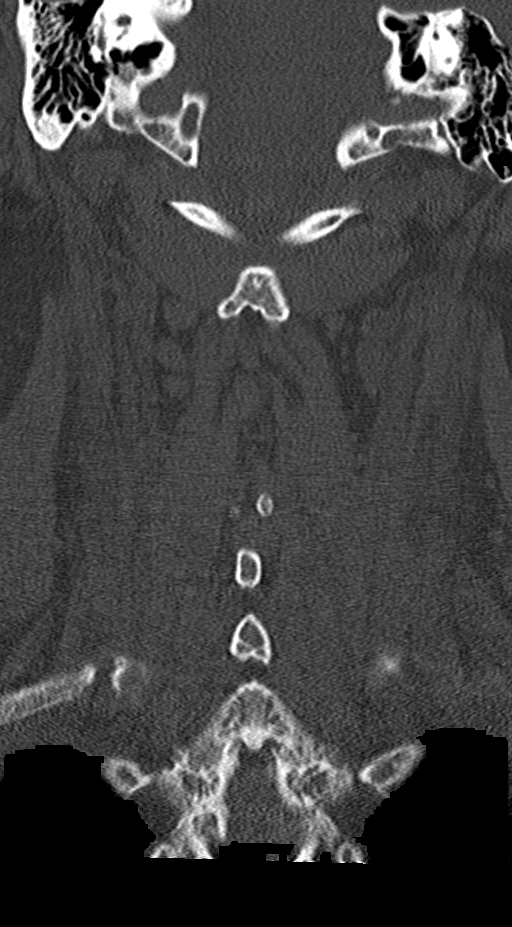

[Series 8: orthogonal bone · axial · 0.23mm/px · z∈[-295,-231]mm · 2 of 104 slices shown, 3 images]
[im 35/104  soft-tissue]
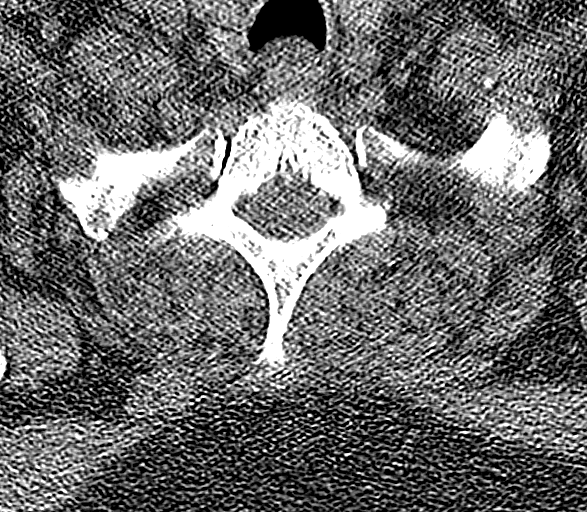
[im 35/104  bone]
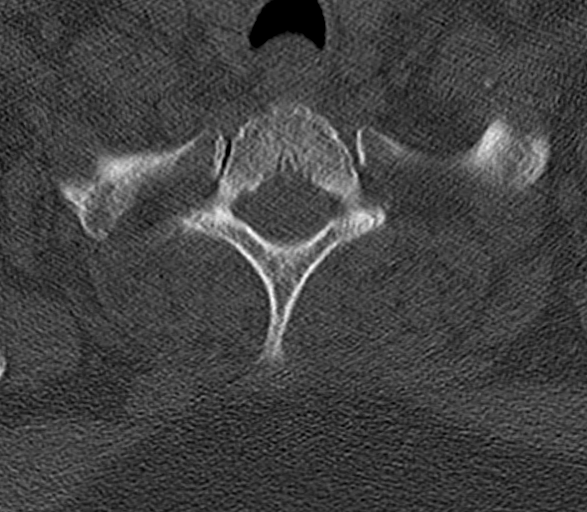
[im 69/104  bone]
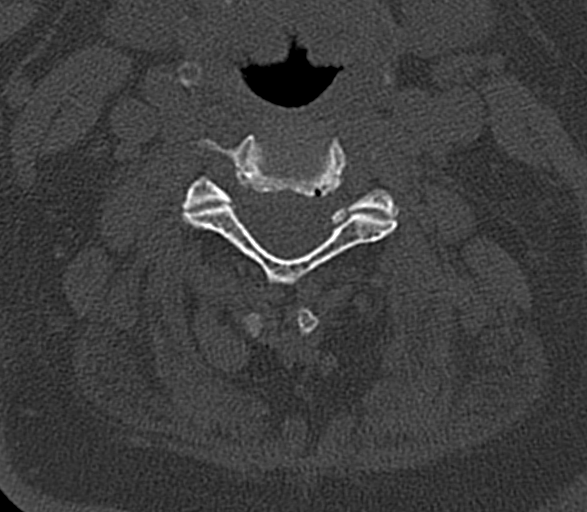

[10 of 33 positions shown; findings below may reference images not displayed]

FINDINGS: Alignment: Alignment is grossly anatomic.

Skull base and vertebrae: No acute displaced fracture.

Soft tissues and spinal canal: No prevertebral fluid or swelling. No
visible canal hematoma.

Disc levels: There is multilevel cervical spondylosis, with disc
space narrowing and osteophyte formation most pronounced at C4-5,
C5-6, and C6-7. There is mild diffuse facet hypertrophy.

Upper chest: Airway is patent.  Lung apices are clear.

Other: Reconstructed images demonstrate no additional findings.
IMPRESSION: 1. Multilevel cervical spondylosis.  No acute fracture.

## 2021-10-25 IMAGING — CR DG FINGER LITTLE 2+V*L*
1 series · 3 of 3 positions shown · non-contrast
Comparison: None.

CLINICAL DATA: Fell, ecchymosis, left fifth digit deformity

EXAM:
LEFT LITTLE FINGER 2+V

[Series 1: dg finger little left · 0.14mm/px · 3 of 3 slices shown]
[im 1/3]
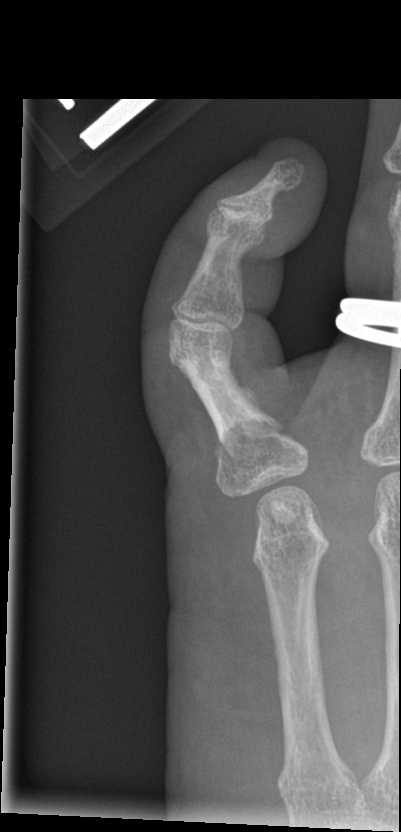
[im 2/3]
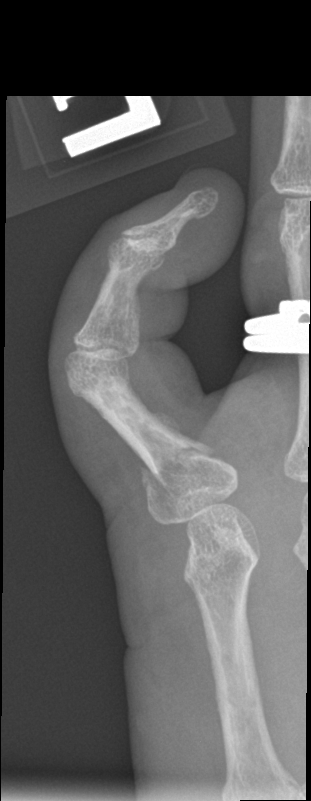
[im 3/3]
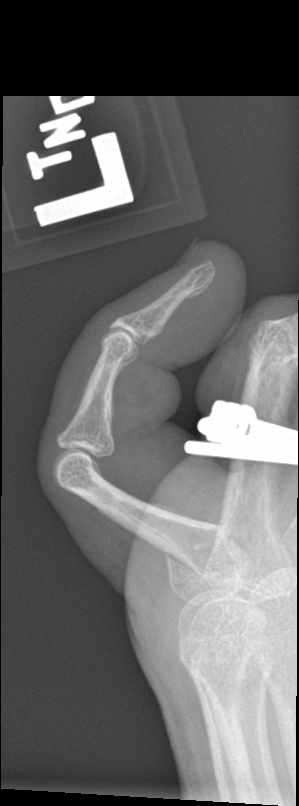

[3 of 3 positions shown; findings below may reference images not displayed]

FINDINGS: Frontal, oblique, lateral views of the left fifth digit demonstrate
a comminuted intra-articular fracture at the base of the fifth
proximal phalanx. There is impaction and dorsal angulation at the
fracture site.

Osteoarthritis is seen within the fifth distal interphalangeal
joint. There is diffuse soft tissue swelling.
IMPRESSION: 1. Comminuted intra-articular fracture at the base of the fifth
proximal phalanx.

## 2021-10-25 IMAGING — CT CT HEAD W/O CM
3 series · 15 of 47 positions shown, 18 images · non-contrast
Comparison: None.

CLINICAL DATA: Tripped and fell, right shoulder pain, nasal
abrasion

EXAM:
CT HEAD WITHOUT CONTRAST
TECHNIQUE: Contiguous axial images were obtained from the base of the skull
through the vertex without intravenous contrast.

[Series 3: head wo · axial · 0.44mm/px · z∈[-147,-17]mm · 9 of 32 slices shown, 12 images]
[im 3/32  brain]
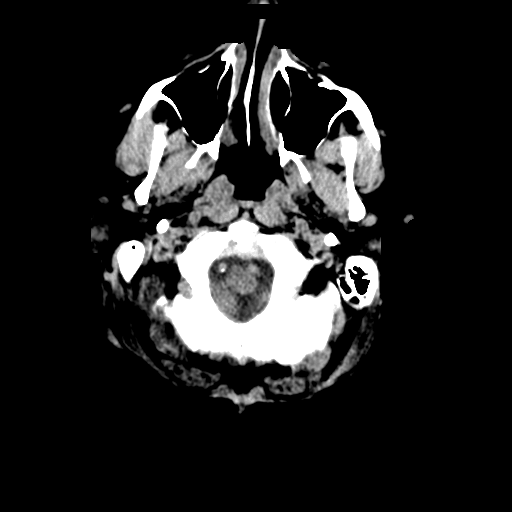
[im 3/32  bone]
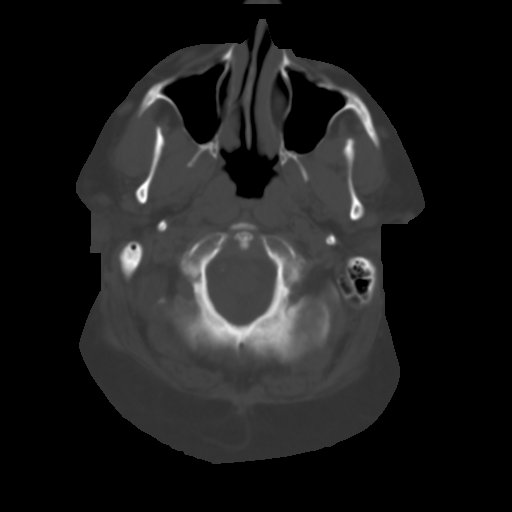
[im 6/32  brain]
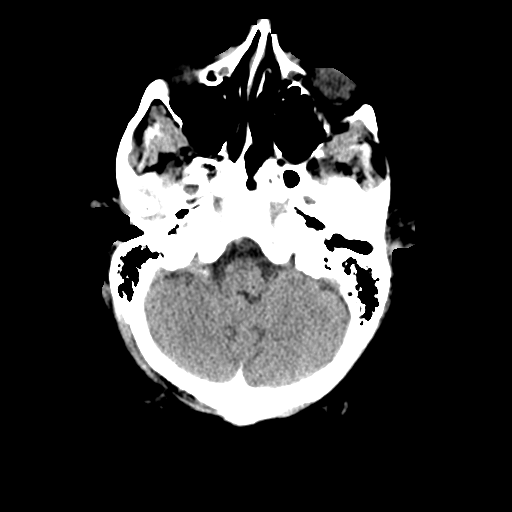
[im 9/32  brain]
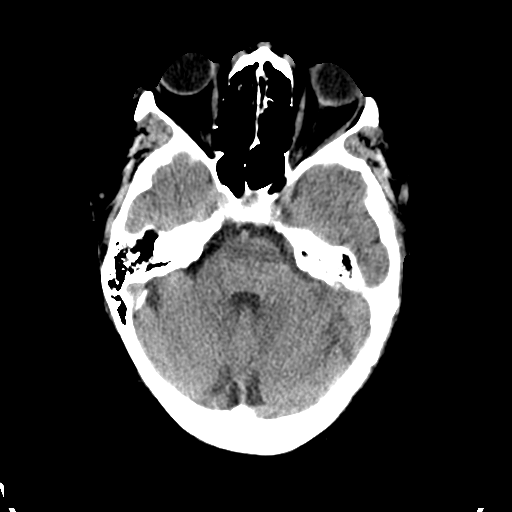
[im 12/32  brain]
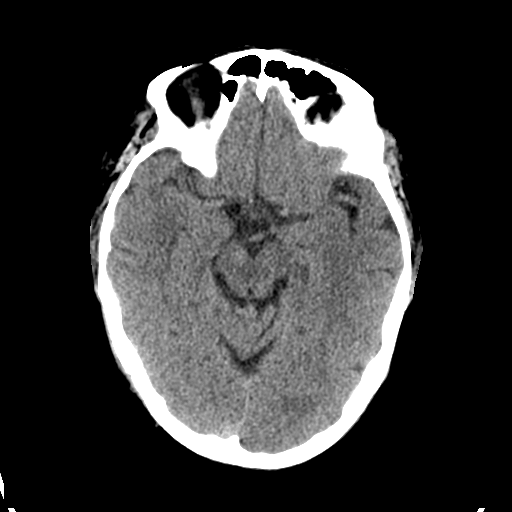
[im 17/32  brain]
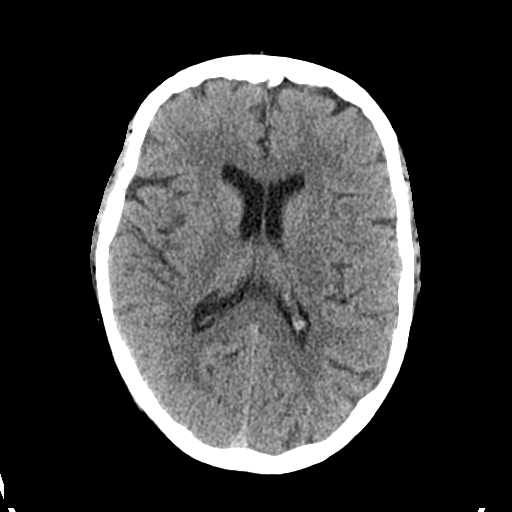
[im 17/32  bone]
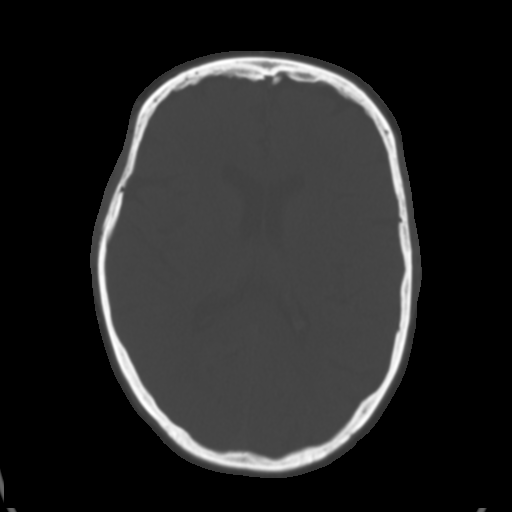
[im 20/32  brain]
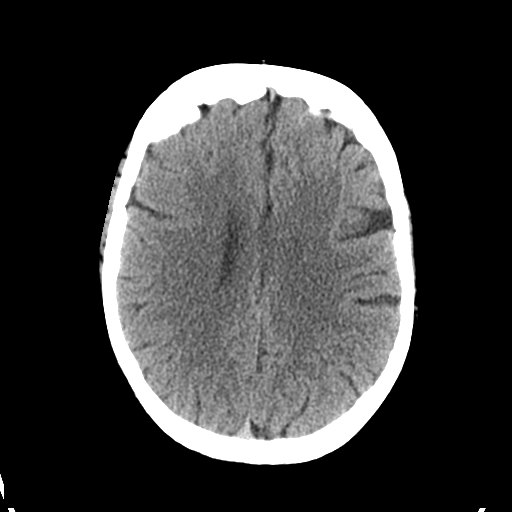
[im 23/32  brain]
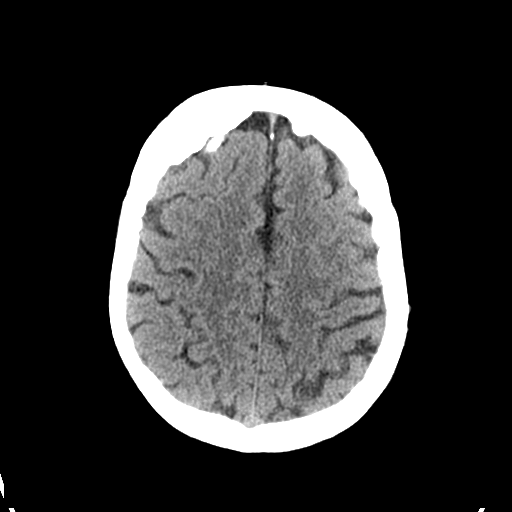
[im 26/32  brain]
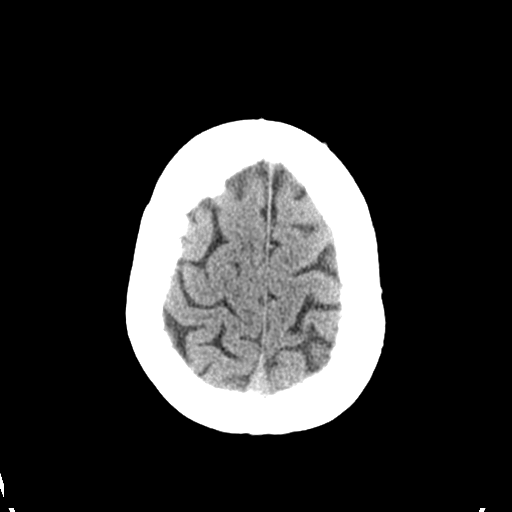
[im 29/32  brain]
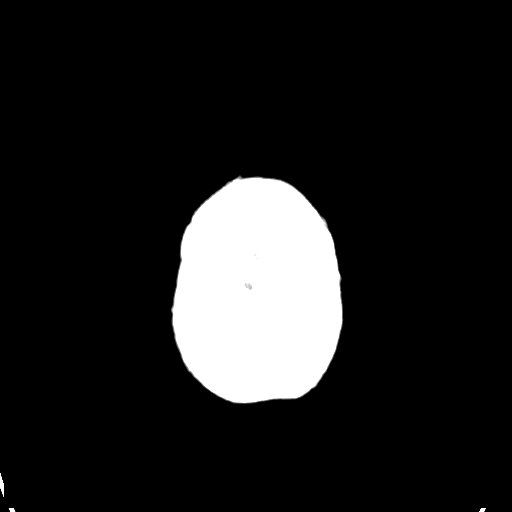
[im 29/32  bone]
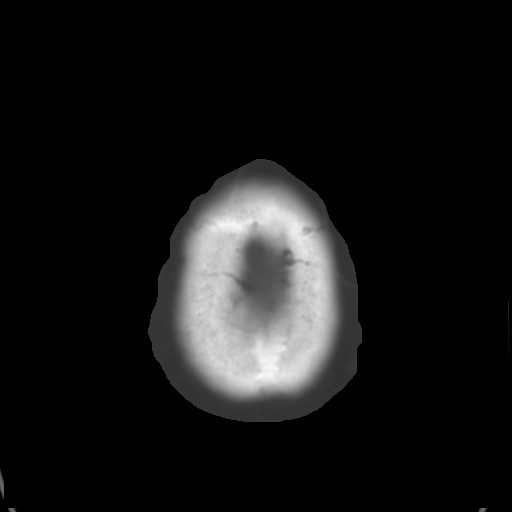

[Series 4: coronal soft tissue · coronal · 0.34mm/px · 3 of 70 slices shown]
[im 24/70  brain]
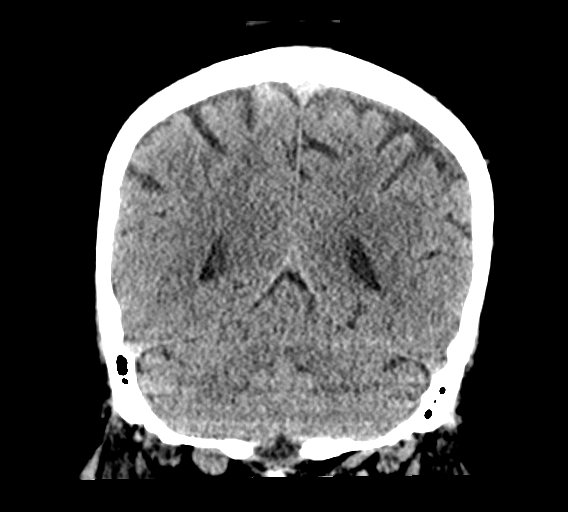
[im 31/70  brain]
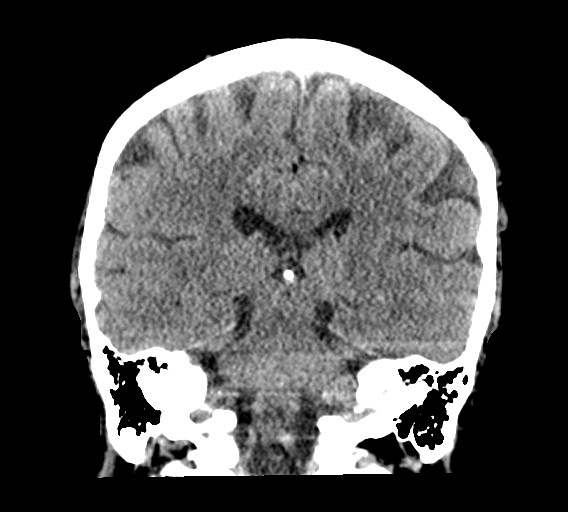
[im 39/70  brain]
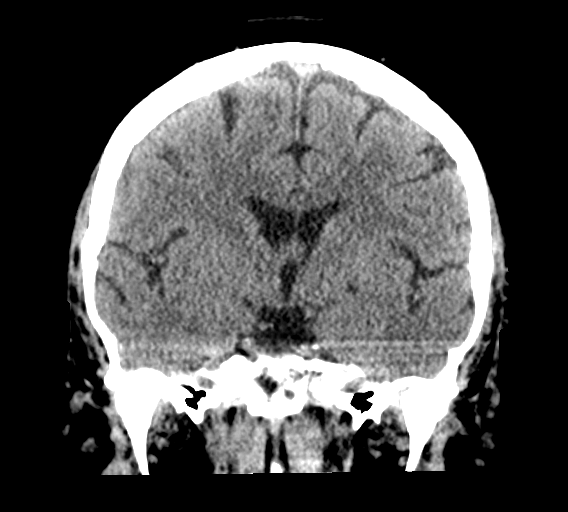

[Series 5: sagittal soft tissue · sagittal · 0.33mm/px · 3 of 56 slices shown]
[im 19/56  brain]
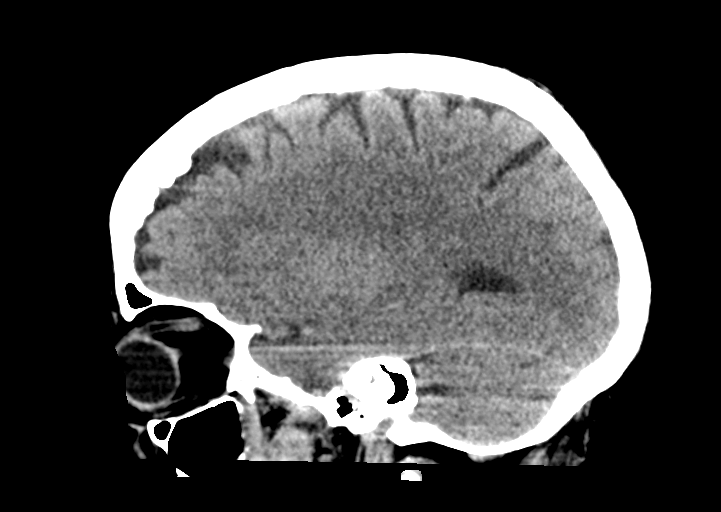
[im 28/56  brain]
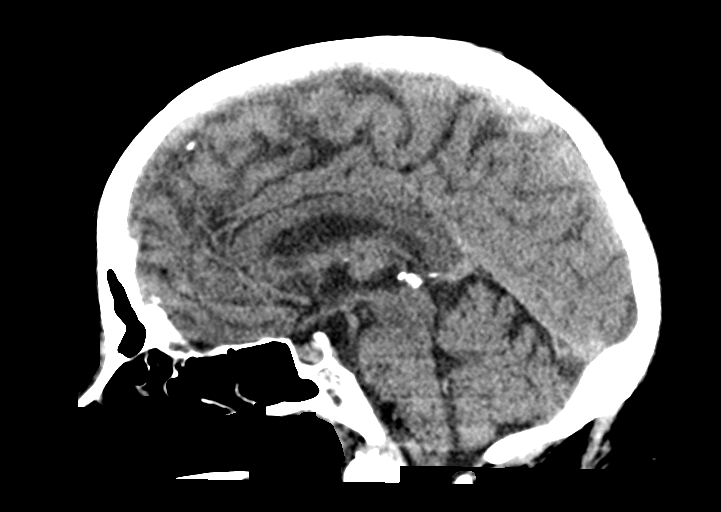
[im 37/56  brain]
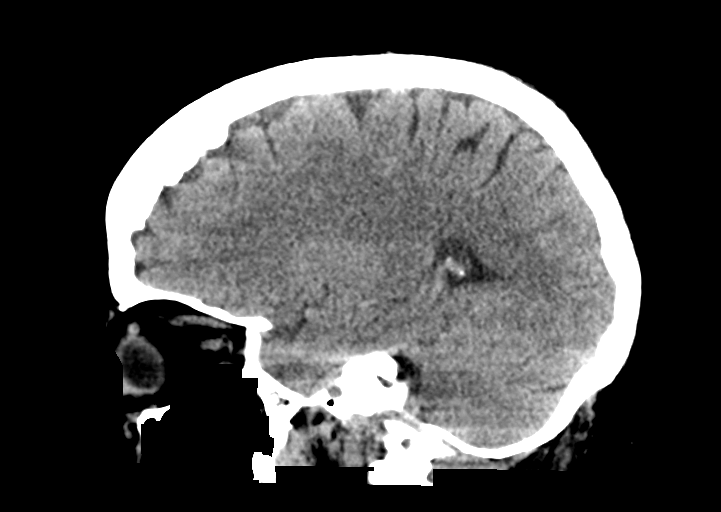

[15 of 47 positions shown; findings below may reference images not displayed]

FINDINGS: Brain: No acute infarct or hemorrhage. Focal hypodensities in the
bilateral basal ganglia consistent with chronic lacunar infarcts.
Lateral ventricles and remaining midline structures are
unremarkable. No acute extra-axial fluid collections. No mass
effect.

Vascular: No hyperdense vessel or unexpected calcification.

Skull: Normal. Negative for fracture or focal lesion.

Sinuses/Orbits: No acute finding.

Other: None.
IMPRESSION: 1. No acute intracranial process.
2. Chronic lacunar infarcts bilateral basal ganglia.

## 2021-12-09 ENCOUNTER — Ambulatory Visit (INDEPENDENT_AMBULATORY_CARE_PROVIDER_SITE_OTHER): Payer: Medicare HMO | Admitting: Family Medicine

## 2021-12-09 ENCOUNTER — Encounter: Payer: Self-pay | Admitting: Family Medicine

## 2021-12-09 VITALS — BP 104/60 | HR 67 | Temp 97.9°F | Ht 64.0 in | Wt 216.0 lb

## 2021-12-09 DIAGNOSIS — I10 Essential (primary) hypertension: Secondary | ICD-10-CM

## 2021-12-09 DIAGNOSIS — Z794 Long term (current) use of insulin: Secondary | ICD-10-CM | POA: Diagnosis not present

## 2021-12-09 DIAGNOSIS — E1140 Type 2 diabetes mellitus with diabetic neuropathy, unspecified: Secondary | ICD-10-CM | POA: Diagnosis not present

## 2021-12-09 LAB — POCT GLYCOSYLATED HEMOGLOBIN (HGB A1C): Hemoglobin A1C: 8.8 % — AB (ref 4.0–5.6)

## 2021-12-09 MED ORDER — METFORMIN HCL 1000 MG PO TABS: ORAL_TABLET | ORAL | 3 refills | Status: DC

## 2021-12-09 MED ORDER — ENALAPRIL MALEATE 20 MG PO TABS: 10.0000 mg | ORAL_TABLET | Freq: Every day | ORAL | Status: DC

## 2021-12-09 NOTE — Progress Notes (Signed)
Diabetes:  Using medications without difficulties: yes, see below.   Hypoglycemic episodes: occ, d/w pt.  Down to 76s occ.  Usually late night or early AM.  Not in the middle of the day   Hyperglycemic episodes: no Feet problems: some tingling at baseline.   Blood Sugars averaging: usually ~110 in the AMs.   eye exam within last year: due, pending for next month.   A1c d/w pt at [facility].    She is trying to work through her grief with the loss of her husband, she is trying to be more social.  She feels better after talking with others.    She had been getting lightheaded on standing and BP is lower, d/w pt about dec enalapril to '10mg'$  a day.   She has lost 3 lbs since the spring.  She is eating more salads.    Meds, vitals, and allergies reviewed.   ROS: Per HPI unless specifically indicated in ROS section   GEN: nad, alert and oriented HEENT: ncat NECK: supple w/o LA CV: rrr. PULM: ctab, no inc wob ABD: soft, +bs EXT: no edema SKIN: no acute rash

## 2021-12-09 NOTE — Patient Instructions (Addendum)
Cut the enalapril in half and see if you feel better and less lightheaded.    Stop the metformin at night and let me know if you still have low sugars at night.  If so, then cut back your evening insulin by 1 unit per day as needed.    Take care.  Glad to see you.  Plan on recheck in about 3 months.  A1c at the visit.

## 2021-12-12 NOTE — Assessment & Plan Note (Signed)
With history of low sugars noted.  See above.  Discussed options.  Stop the metformin at night and she can let me know if still having low sugars at night.  If so, then cut back evening insulin by 1 unit per day as needed.    Plan on recheck in about 3 months.  A1c at the visit.

## 2021-12-12 NOTE — Assessment & Plan Note (Signed)
She had been getting lightheaded on standing and BP is lower, d/w pt about dec enalapril to '10mg'$  a day.   She has lost 3 lbs since the spring.  She is eating more salads.  She can update me as needed.

## 2021-12-21 DIAGNOSIS — Z0100 Encounter for examination of eyes and vision without abnormal findings: Secondary | ICD-10-CM | POA: Diagnosis not present

## 2021-12-21 DIAGNOSIS — E113293 Type 2 diabetes mellitus with mild nonproliferative diabetic retinopathy without macular edema, bilateral: Secondary | ICD-10-CM | POA: Diagnosis not present

## 2021-12-21 LAB — HM DIABETES EYE EXAM

## 2021-12-22 DIAGNOSIS — Z0100 Encounter for examination of eyes and vision without abnormal findings: Secondary | ICD-10-CM | POA: Diagnosis not present

## 2021-12-25 ENCOUNTER — Ambulatory Visit
Admission: EM | Admit: 2021-12-25 | Discharge: 2021-12-25 | Disposition: A | Discharge status: Home / Self Care | Payer: Medicare HMO | Attending: Emergency Medicine | Admitting: Emergency Medicine

## 2021-12-25 DIAGNOSIS — R059 Cough, unspecified: Secondary | ICD-10-CM | POA: Diagnosis present

## 2021-12-25 DIAGNOSIS — B349 Viral infection, unspecified: Secondary | ICD-10-CM | POA: Diagnosis not present

## 2021-12-25 DIAGNOSIS — J069 Acute upper respiratory infection, unspecified: Secondary | ICD-10-CM | POA: Diagnosis not present

## 2021-12-25 DIAGNOSIS — J3489 Other specified disorders of nose and nasal sinuses: Secondary | ICD-10-CM | POA: Diagnosis present

## 2021-12-25 DIAGNOSIS — Z20822 Contact with and (suspected) exposure to covid-19: Secondary | ICD-10-CM | POA: Diagnosis not present

## 2021-12-25 LAB — SARS CORONAVIRUS 2 BY RT PCR: SARS Coronavirus 2 by RT PCR: NEGATIVE

## 2021-12-25 MED ORDER — IPRATROPIUM BROMIDE 0.06 % NA SOLN: 2.0000 | Freq: Four times a day (QID) | NASAL | 12 refills | Status: DC

## 2021-12-25 MED ORDER — PROMETHAZINE-DM 6.25-15 MG/5ML PO SYRP: 5.0000 mL | ORAL_SOLUTION | Freq: Four times a day (QID) | ORAL | 0 refills | Status: DC | PRN

## 2021-12-25 MED ORDER — BENZONATATE 100 MG PO CAPS: 200.0000 mg | ORAL_CAPSULE | Freq: Three times a day (TID) | ORAL | 0 refills | Status: DC

## 2021-12-25 NOTE — Discharge Instructions (Addendum)
Your COVID test today was negative.  Your exam is consistent with respiratory faction and it is most likely viral.  I have prescribed the following medications to help you with your symptoms.  Use the Atrovent nasal spray, 2 squirts in each nostril every 6 hours, as needed for runny nose and postnasal drip.  Use the Tessalon Perles every 8 hours during the day.  Take them with a small sip of water.  They may give you some numbness to the base of your tongue or a metallic taste in your mouth, this is normal.  Use the Promethazine DM cough syrup at bedtime for cough and congestion.  It will make you drowsy so do not take it during the day.  You may use over-the-counter Tylenol and ibuprofen according to package instructions as needed for body aches or fever.  Return for reevaluation or see your primary care provider for any new or worsening symptoms.

## 2021-12-25 NOTE — ED Triage Notes (Signed)
Pt c/o chills, runny nose, cough onset mostly this morning. Cough x few days. Pt states she has not been able to check temp. Pt requesting covid testing

## 2021-12-25 NOTE — ED Provider Notes (Signed)
[facility]    CSN: [id number] Arrival date & time: 12/25/21  1258      History   Chief Complaint Chief Complaint  Patient presents with   Chills   Cough   Nasal Congestion    HPI Alexis [name] is a 71 y.o. female.   HPI  71 year old female here for evaluation of respiratory complaints.  Patient reports that for last couple days she has been experiencing runny nose with clear nasal discharge, subjective fever, and intermittent productive cough with white sputum, and nausea.  She has not had any pain in her ears, sore throat, shortness of breath or wheezing, vomiting, diarrhea, or known sick contacts.  She does report that she has been a significant number of public places in the past week and has not been wearing a mask in public.  Past Medical History:  Diagnosis Date   Allergy    Asthma    in childhood   Benign tumor of pelvic bones, sacrum, and coccyx    Blood in stool    Chest pain    a. 07/2017 MV: EF 63%, no ischemia/infarct. GI update artifact.   Diabetes mellitus    dx'd at age ~80   GERD (gastroesophageal reflux disease)    Hx of colonic polyps    Hyperlipidemia    Hypertension    dx'd at age ~61   Morbid obesity ([place])    Sleep apnea    on CPAP   Tremor    only in face; worse under periods of stress. father with tremor also    Patient Active Problem List   Diagnosis Date Noted   COVID 02/17/2021   BRBPR (bright red blood per rectum) 11/06/2019   Rhinitis 08/22/2018   Sciatica 05/27/2018   Knee pain 05/27/2018   Advance care planning 02/18/2018   Aortic atherosclerosis ([place]) 08/04/2017   Coronary artery calcification seen on CAT scan 08/04/2017   Shortness of breath 08/04/2017   Mixed hyperlipidemia 08/04/2017   Exertional chest pain 08/02/2017   Nosebleed 01/29/2017   Health care maintenance 01/29/2017   Cough 10/16/2016   Dysphagia 09/29/2016   Tremor 09/29/2016   Postoperative anemia 12/30/2015   FH: colon cancer 10/18/2011    Meralgia paraesthetica 02/15/2011   Type 2 diabetes mellitus with diabetic neuropathy, unspecified ([place]) 11/11/2010   Shoulder pain, left 09/21/2010   Vaginitis 08/10/2010   Diverticulosis 08/09/2010   Pure hypercholesterolemia 07/27/2010   Essential hypertension, benign 07/27/2010    Past Surgical History:  Procedure Laterality Date   [place]   Surgery included removal of benign pelvic tumor   COLONOSCOPY  01/20/2017   LAPAROSCOPIC APPENDECTOMY N/A 10/30/2015   Procedure: APPENDECTOMY LAPAROSCOPIC, lysis of adhesions;  Surgeon: Hubbard Robinson, MD;  Location: [facility] ORS;  Service: General;  Laterality: N/A;   OVARY BIOPSY  1988   pelvic bone tumor  [facility]  01/20/2017    OB History   No obstetric history on file.      Home Medications    Prior to Admission medications   Medication Sig Start Date End Date Taking? Authorizing Provider  Alcohol Swabs (B-D SINGLE USE SWABS REGULAR) PADS Use as instructed to check blood sugar twice daily.  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 11/12/19  Yes [name], MD  aspirin EC 81 MG tablet Take 81 mg by mouth daily.   Yes [provider]  benzonatate (TESSALON) 100  MG capsule Take 2 capsules (200 mg total) by mouth every 8 (eight) hours. 12/25/21  Yes Margarette Canada, NP  Blood Glucose Calibration (TRUE METRIX LEVEL 2) Normal SOLN Use as instructed to check blood sugar meter.  Diagnosis:  E11.9  Insulin dependent 11/12/19  Yes [name], MD  Blood Glucose Monitoring Suppl (TRUE METRIX METER) w/Device KIT Use to check blood sugar twice daily.  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 11/05/19  Yes [name], MD  DROPLET INSULIN SYRINGE 30G X 5/16" 0.5 ML MISC USE TO INJECT INSULIN TWICE DAILY AS DIRECTED 05/21/18  Yes [name], MD  enalapril (VASOTEC) 20 MG tablet Take 0.5 tablets (10 mg total) by mouth daily. 12/09/21  Yes [name],  MD  ezetimibe (ZETIA) 10 MG tablet Take 1 tablet (10 mg total) by mouth daily. 06/08/21  Yes [name], MD  gabapentin (NEURONTIN) 400 MG capsule Take 1 capsule (400 mg total) by mouth at bedtime. 06/01/21  Yes [name], MD  glucose blood (TRUE METRIX BLOOD GLUCOSE TEST) test strip Use as instructed to check blood sugar twice daily.  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 11/05/19  Yes [name], MD  insulin NPH-regular Human (70-30) 100 UNIT/ML injection 42 units in AM and 30 units in PM 09/07/21  Yes [name], MD  ipratropium (ATROVENT) 0.06 % nasal spray Place 2 sprays into both nostrils 4 (four) times daily. 12/25/21  Yes Margarette Canada, NP  loratadine (CLARITIN) 10 MG tablet Take 1 tablet (10 mg total) by mouth daily. 10/14/16  Yes [name], MD  metFORMIN (GLUCOPHAGE) 1000 MG tablet 1 tab in the AM 12/09/21  Yes [name], MD  nystatin (MYCOSTATIN/NYSTOP) powder Apply 1 application topically 3 (three) times daily. 11/05/19  Yes [name], MD  omeprazole (PRILOSEC) 20 MG capsule Take 20 mg by mouth daily.   Yes [provider]  Probiotic Product (ALIGN) 4 MG CAPS Take 1 capsule by mouth daily.   Yes [provider]  promethazine-dextromethorphan (PROMETHAZINE-DM) 6.25-15 MG/5ML syrup Take 5 mLs by mouth 4 (four) times daily as needed. 12/25/21  Yes Margarette Canada, NP  propranolol (INDERAL) 20 MG tablet Take 1 tablet (20 mg total) by mouth 2 (two) times daily. 06/01/21  Yes [name], MD  rosuvastatin (CRESTOR) 10 MG tablet Take 0.5 tablets (5 mg total) by mouth daily. 06/08/21  Yes Gollan, Kathlene November, MD  TRUEplus Lancets 33G MISC Use as instructed to check blood sugar twice daily.  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 11/12/19  Yes [name], MD    Family History Family History  Problem Relation Age of Onset   Heart disease Mother        h/o valve replacement, no h/o MI   Colon cancer Father    Colon cancer Sister         metastatic (liver?) cancer   Uterine cancer Sister    Lung cancer Maternal Grandfather    Breast cancer Neg Hx     Social History Social History   Tobacco Use   Smoking status: Never   Smokeless tobacco: Never  Vaping Use   Vaping Use: Never used  Substance Use Topics   Alcohol use: No   Drug use: No     Allergies   Milk-related compounds, Peanut-containing drug products, and Statins   Review of Systems Review of Systems  Constitutional:  Positive for fever.  HENT:  Positive for  congestion and rhinorrhea. Negative for ear pain and sore throat.   Respiratory:  Positive for cough. Negative for shortness of breath and wheezing.   Gastrointestinal:  Positive for nausea. Negative for diarrhea and vomiting.  Hematological: Negative.   Psychiatric/Behavioral: Negative.       Physical Exam Triage Vital Signs ED Triage Vitals  Enc Vitals Group     BP 12/25/21 1308 135/76     Pulse Rate 12/25/21 1308 65     Resp --      Temp 12/25/21 1308 98.2 F (36.8 C)     Temp Source 12/25/21 1308 Oral     SpO2 12/25/21 1308 98 %     Weight 12/25/21 1306 218 lb (98.9 kg)     Height 12/25/21 1306 5' 2"  (1.575 m)     Head Circumference --      Peak Flow --      Pain Score 12/25/21 1306 0     Pain Loc --      Pain Edu? --      Excl. in [place]? --    No data found.  Updated Vital Signs BP 135/76 (BP Location: Left Arm)   Pulse 65   Temp 98.2 F (36.8 C) (Oral)   Ht 5' 2"  (1.575 m)   Wt 218 lb (98.9 kg)   SpO2 98%   BMI 39.87 kg/m   Visual Acuity Right Eye Distance:   Left Eye Distance:   Bilateral Distance:    Right Eye Near:   Left Eye Near:    Bilateral Near:     Physical Exam Vitals and nursing note reviewed.  Constitutional:      Appearance: Normal appearance. She is not ill-appearing.  HENT:     Head: Normocephalic and atraumatic.     Right Ear: Tympanic membrane, ear canal and external ear normal. There is no impacted cerumen.     Left Ear: Tympanic  membrane, ear canal and external ear normal. There is no impacted cerumen.     Nose: Congestion and rhinorrhea present.     Mouth/Throat:     Mouth: Mucous membranes are moist.     Pharynx: Oropharynx is clear. No oropharyngeal exudate or posterior oropharyngeal erythema.  Cardiovascular:     Rate and Rhythm: Normal rate and regular rhythm.     Pulses: Normal pulses.     Heart sounds: Normal heart sounds. No murmur heard.    No friction rub. No gallop.  Pulmonary:     Effort: Pulmonary effort is normal.     Breath sounds: Normal breath sounds. No wheezing, rhonchi or rales.  Musculoskeletal:     Cervical back: Normal range of motion and neck supple.  Lymphadenopathy:     Cervical: No cervical adenopathy.  Skin:    General: Skin is warm and dry.     Capillary Refill: Capillary refill takes less than 2 seconds.     Findings: No erythema or rash.  Neurological:     General: No focal deficit present.     Mental Status: She is alert and oriented to person, place, and time.  Psychiatric:        Mood and Affect: Mood normal.        Behavior: Behavior normal.        Thought Content: Thought content normal.        Judgment: Judgment normal.      UC Treatments / Results  Labs (all labs ordered are listed, but only abnormal results are displayed) Labs  Reviewed  SARS CORONAVIRUS 2 BY RT PCR    EKG   Radiology No results found.  Procedures Procedures (including critical care time)  Medications Ordered in UC Medications - No data to display  Initial Impression / Assessment and Plan / UC Course  I have reviewed the triage vital signs and the nursing notes.  Pertinent labs & imaging results that were available during my care of the patient were reviewed by me and considered in my medical decision making (see chart for details).   Patient is a nontoxic-appearing 71 year old female here for evaluation of 2 days worth of respiratory complaints as outlined in HPI above.  She is  requesting a COVID test.  Her physical exam reveals pearly-gray tympanic membranes bilaterally with normal light reflex and clear external auditory canals.  Nasal mucosa is erythematous and edematous with clear discharge in both nares.  Oropharyngeal exam is benign.  No anterior cervical lymphadenopathy appreciated on exam.  Cardiopulmonary exam reveals S1-S2 heart sounds with regular rate and rhythm and lung sounds are clear to auscultation in all fields.  I will order a COVID PCR as patient does have signs of upper respiratory infection.  COVID PCR is negative.  I will discharge patient home with a diagnosis of viral URI with cough and treat her symptoms with intranasal spray, Tessalon Perles, and Promethazine DM cough syrup.   Final Clinical Impressions(s) / UC Diagnoses   Final diagnoses:  Viral URI with cough     Discharge Instructions      Your COVID test today was negative.  Your exam is consistent with respiratory faction and it is most likely viral.  I have prescribed the following medications to help you with your symptoms.  Use the Atrovent nasal spray, 2 squirts in each nostril every 6 hours, as needed for runny nose and postnasal drip.  Use the Tessalon Perles every 8 hours during the day.  Take them with a small sip of water.  They may give you some numbness to the base of your tongue or a metallic taste in your mouth, this is normal.  Use the Promethazine DM cough syrup at bedtime for cough and congestion.  It will make you drowsy so do not take it during the day.  You may use over-the-counter Tylenol and ibuprofen according to package instructions as needed for body aches or fever.  Return for reevaluation or see your primary care provider for any new or worsening symptoms.      ED Prescriptions     Medication Sig Dispense Auth. Provider   benzonatate (TESSALON) 100 MG capsule Take 2 capsules (200 mg total) by mouth every 8 (eight) hours. 21 capsule Margarette Canada, NP    ipratropium (ATROVENT) 0.06 % nasal spray Place 2 sprays into both nostrils 4 (four) times daily. 15 mL Margarette Canada, NP   promethazine-dextromethorphan (PROMETHAZINE-DM) 6.25-15 MG/5ML syrup Take 5 mLs by mouth 4 (four) times daily as needed. 118 mL Margarette Canada, NP      PDMP not reviewed this encounter.   Margarette Canada, NP 12/25/21 1355

## 2022-02-08 ENCOUNTER — Encounter: Payer: Self-pay | Admitting: Internal Medicine

## 2022-03-14 ENCOUNTER — Ambulatory Visit (INDEPENDENT_AMBULATORY_CARE_PROVIDER_SITE_OTHER): Payer: Medicare HMO | Admitting: Family Medicine

## 2022-03-14 ENCOUNTER — Encounter: Payer: Self-pay | Admitting: Family Medicine

## 2022-03-14 VITALS — BP 120/60 | HR 75 | Temp 97.2°F | Ht 62.0 in | Wt 217.0 lb

## 2022-03-14 DIAGNOSIS — Z23 Encounter for immunization: Secondary | ICD-10-CM | POA: Diagnosis not present

## 2022-03-14 DIAGNOSIS — Z794 Long term (current) use of insulin: Secondary | ICD-10-CM | POA: Diagnosis not present

## 2022-03-14 DIAGNOSIS — E1140 Type 2 diabetes mellitus with diabetic neuropathy, unspecified: Secondary | ICD-10-CM | POA: Diagnosis not present

## 2022-03-14 LAB — POCT GLYCOSYLATED HEMOGLOBIN (HGB A1C): Hemoglobin A1C: 8.2 % — AB (ref 4.0–5.6)

## 2022-03-14 NOTE — Progress Notes (Unsigned)
Diabetes:  Using medications without difficulties: yes Hypoglycemic episodes: down to 61 yesterday.  This AM 138.   Hyperglycemic episodes: no Feet problems: some pain, some better with gabapentin.  Blood Sugars averaging: see above.   eye exam within last year: yes D/w pt about me checking with pharmacy staff here about possible long acting insulin.    D/w pt about seeing GI clinic, contact info given to patient for her to call.    Meds, vitals, and allergies reviewed.  ROS: Per HPI unless specifically indicated in ROS section   GEN: nad, alert and oriented HEENT: ncat NECK: supple w/o LA CV: rrr. PULM: ctab, no inc wob ABD: soft, +bs EXT: no edema SKIN: no acute rash

## 2022-03-14 NOTE — Patient Instructions (Addendum)
Please call the GI clinic.   I'll check with pharmacy here.  Take care.  Glad to see you. Plan on recheck in about 3-4 months with labs before the visit.

## 2022-03-16 ENCOUNTER — Telehealth: Payer: Self-pay | Admitting: Family Medicine

## 2022-03-16 NOTE — Telephone Encounter (Signed)
Is it possible to get this patient set up with assistance on long-acting insulin?  Many thanks.

## 2022-03-16 NOTE — Assessment & Plan Note (Signed)
A1c improved but not all the way to goal.  She has had some episodic mild hypoglycemic events.  It would be reasonable to try to transition her to long-acting insulin and I am checking with pharmacy staff to see if she can get any financial assistance with that.  In the meantime continue work on diet and exercise with a goal to limit hypoglycemia.  Continue 70/30 insulin as is along with metformin.  See after visit summary.

## 2022-03-17 NOTE — Telephone Encounter (Addendum)
Patient has [employer] which prefers [place], Lantus and Levemir long acting insulins. These are available for $35 per month, pt reports this would be reasonable. She is currently paying $25 per vial of Novolin 70/30 which she uses about 2 vials per month.   Recommend to start [place]. Would reduce daily insulin dose 20% when switching from NPH to 24-hr insulin.  Plan:  -Stop Novolin 70/30. Start Tresiba U100 - 55 units once a day. -Pt is also interested in CGM. Ordered Freestyle Libre 3 to [employer], pt will call when she receives it to set up training appt.   Preferred pharmacy: [facility] [address], [place] [address] Phone: [phone] Fax: [phone]

## 2022-03-17 NOTE — Addendum Note (Signed)
Addended by: Charlton Haws on: 03/17/2022 11:37 AM   Modules accepted: Orders

## 2022-03-17 NOTE — Telephone Encounter (Signed)
Many thanks. 

## 2022-04-04 NOTE — Telephone Encounter (Signed)
[employer] has been trying to contact patient regarding Freestyle Libre since 12/5. Please call patient and see if she received their messages. She can call them at [phone] to complete the process herself.

## 2022-04-05 NOTE — Telephone Encounter (Signed)
Multiple attempts to reach the patient .Unsuccessful outreach. No voice mail available.  Charlene Brooke, CPP notified  Avel Sensor, [place]  [phone]

## 2022-04-14 ENCOUNTER — Other Ambulatory Visit: Payer: Self-pay | Admitting: Cardiovascular Disease

## 2022-04-18 ENCOUNTER — Other Ambulatory Visit: Payer: Self-pay | Admitting: Family Medicine

## 2022-05-12 ENCOUNTER — Ambulatory Visit (INDEPENDENT_AMBULATORY_CARE_PROVIDER_SITE_OTHER): Payer: Medicare HMO

## 2022-05-12 VITALS — Ht 62.0 in | Wt 217.0 lb

## 2022-05-12 DIAGNOSIS — Z0000 Encounter for general adult medical examination without abnormal findings: Secondary | ICD-10-CM | POA: Diagnosis not present

## 2022-05-12 NOTE — Progress Notes (Signed)
Subjective:   Alexis [name] is a 72 y.o. female who presents for Medicare Annual (Subsequent) preventive examination.  Review of Systems    No ROS.  Medicare Wellness Virtual Visit.  Visual/audio telehealth visit, UTA vital signs.   See social history for additional risk factors.   Cardiac Risk Factors include: advanced age (>6mn, >>81women);diabetes mellitus;hypertension     Objective:    Today's Vitals   05/12/22 1244  Weight: 217 lb (98.4 kg)  Height: '5\' 2"'$  (1.575 m)   Body mass index is 39.69 kg/m.     05/12/2022   12:41 PM 05/11/2021   12:01 PM 08/22/2019    4:05 PM 02/21/2019    2:03 PM 02/15/2018    3:10 PM 01/24/2017   12:13 PM 01/20/2017    7:17 AM  Advanced Directives  Does Patient Have a Medical Advance Directive? No No No No No No No  Would patient like information on creating a medical advance directive? No - Patient declined Yes (MAU/Ambulatory/Procedural Areas - Information given) No - Patient declined No - Patient declined Yes (MAU/Ambulatory/Procedural Areas - Information given)      Current Medications (verified) Outpatient Encounter Medications as of 05/12/2022  Medication Sig   Alcohol Swabs (B-D SINGLE USE SWABS REGULAR) PADS Use as instructed to check blood sugar twice daily.  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   aspirin EC 81 MG tablet Take 81 mg by mouth daily.   Blood Glucose Calibration (TRUE METRIX LEVEL 2) Normal SOLN Use as instructed to check blood sugar meter.  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   DROPLET INSULIN SYRINGE 30G X 5/16" 0.5 ML MISC USE TO INJECT INSULIN TWICE DAILY AS DIRECTED   enalapril (VASOTEC) 20 MG tablet Take 0.5 tablets (10 mg total) by mouth daily.   ezetimibe (ZETIA) 10 MG tablet TAKE 1 TABLET EVERY DAY   gabapentin (NEURONTIN) 400 MG capsule Take 1 capsule (400 mg total) by  mouth at bedtime.   glucose blood (TRUE METRIX BLOOD GLUCOSE TEST) test strip Use as instructed to check blood sugar twice daily.  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   insulin degludec (TRESIBA FLEXTOUCH) 100 UNIT/ML FlexTouch Pen Inject 55 Units into the skin daily.   Insulin Pen Needle (NOVOFINE PEN NEEDLE) 32G X 6 MM MISC by Does not apply route. Use as directed to inject Tresiba   ipratropium (ATROVENT) 0.06 % nasal spray Place 2 sprays into both nostrils 4 (four) times daily.   loratadine (CLARITIN) 10 MG tablet Take 1 tablet (10 mg total) by mouth daily.   metFORMIN (GLUCOPHAGE) 1000 MG tablet 1 tab in the AM   nystatin (MYCOSTATIN/NYSTOP) powder Apply 1 application topically 3 (three) times daily.   omeprazole (PRILOSEC) 20 MG capsule Take 20 mg by mouth daily.   Probiotic Product (ALIGN) 4 MG CAPS Take 1 capsule by mouth daily.   propranolol (INDERAL) 20 MG tablet TAKE 1 TABLET TWICE DAILY   rosuvastatin (CRESTOR) 10 MG tablet TAKE 1/2 TABLET EVERY DAY   TRUEplus Lancets 33G MISC Use as instructed to check blood sugar twice daily.  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   No facility-administered encounter medications on file as of 05/12/2022.    Allergies (verified) Milk-related compounds, Peanut-containing drug products, and Statins   History: Past Medical History:  Diagnosis Date   Allergy  Asthma    in childhood   Benign tumor of pelvic bones, sacrum, and coccyx    Blood in stool    Chest pain    a. 07/2017 MV: EF 63%, no ischemia/infarct. GI update artifact.   Diabetes mellitus    dx'd at age ~20   GERD (gastroesophageal reflux disease)    Hx of colonic polyps    Hyperlipidemia    Hypertension    dx'd at age ~75   Morbid obesity ([place])    Sleep apnea    on CPAP   Tremor    only in face; worse under periods of stress. father with tremor also   Past Surgical History:  Procedure Laterality Date   Ramirez-Perez   Surgery included removal of  benign pelvic tumor   COLONOSCOPY  01/20/2017   LAPAROSCOPIC APPENDECTOMY N/A 10/30/2015   Procedure: APPENDECTOMY LAPAROSCOPIC, lysis of adhesions;  Surgeon: Hubbard Robinson, MD;  Location: [facility] ORS;  Service: General;  Laterality: N/A;   OVARY BIOPSY  1988   pelvic bone tumor  [place]   UPPER GI ENDOSCOPY  01/20/2017   Family History  Problem Relation Age of Onset   Heart disease Mother        h/o valve replacement, no h/o MI   Colon cancer Father    Colon cancer Sister        metastatic (liver?) cancer   Uterine cancer Sister    Lung cancer Maternal Grandfather    Breast cancer Neg Hx    Social History   Socioeconomic History   Marital status: Widowed    Spouse name: Not on file   Number of children: Not on file   Years of education: Not on file   Highest education level: Not on file  Occupational History   Occupation: Payroll Group Lead    Employer: [employer]  Tobacco Use   Smoking status: Never   Smokeless tobacco: Never  Vaping Use   Vaping Use: Never used  Substance and Sexual Activity   Alcohol use: No   Drug use: No   Sexual activity: Not on file  Other Topics Concern   Not on file  Social History Narrative   Widowed 2022, was married 1970 to [employer]      Education:  12th grade   Retired from [employer].   Enjoys playing piano (plays for church), time at church, time with grandkids   2 kids   Social Determinants of Health   Financial Resource Strain: Low Risk  (05/12/2022)   Overall Financial Resource Strain (CARDIA)    Difficulty of Paying Living Expenses: Not hard at all  Food Insecurity: No Food Insecurity (05/12/2022)   Hunger Vital Sign    Worried About Running Out of Food in the Last Year: Never true    Ran Out of Food in the Last Year: Never true  Transportation Needs: No Transportation Needs (05/12/2022)   PRAPARE - Hydrologist (Medical): No    Lack of Transportation  (Non-Medical): No  Physical Activity: Insufficiently Active (05/12/2022)   Exercise Vital Sign    Days of Exercise per Week: 2 days    Minutes of Exercise per Session: 60 min  Stress: No Stress Concern Present (05/12/2022)   [place]    Feeling of Stress : Not at all  Social Connections: Moderately Integrated (05/12/2022)   Social Connection and  Isolation Panel [NHANES]    Frequency of Communication with Friends and Family: More than three times a week    Frequency of Social Gatherings with Friends and Family: Three times a week    Attends Religious Services: More than 4 times per year    Active Member of Clubs or Organizations: Yes    Attends Archivist Meetings: More than 4 times per year    Marital Status: Widowed    Tobacco Counseling Counseling given: Not Answered   Clinical Intake:  Pre-visit preparation completed: Yes        Diabetes: Yes (Followed by pcp)  How often do you need to have someone help you when you read instructions, pamphlets, or other written materials from your doctor or pharmacy?: 1 - Never    Interpreter Needed?: No      Activities of Daily Living    05/12/2022   12:50 PM  In your present state of health, do you have any difficulty performing the following activities:  Hearing? 0  Vision? 0  Difficulty concentrating or making decisions? 0  Walking or climbing stairs? 0  Dressing or bathing? 0  Doing errands, shopping? 0  Preparing Food and eating ? N  Using the Toilet? N  In the past six months, have you accidently leaked urine? N  Do you have problems with loss of bowel control? N  Managing your Medications? N  Managing your Finances? N  Housekeeping or managing your Housekeeping? N    Patient Care Team: [name], MD as PCP - General (Family Medicine) Rockey Situ Kathlene November, MD as PCP - Cardiology (Cardiology) [name], [facility] as Pharmacist  (Pharmacist) Eulogio Bear, MD as Consulting Physician (Ophthalmology)  Indicate any recent Medical Services you may have received from other than [facility] providers in the past year (date may be approximate).     Assessment:   This is a routine wellness examination for Alexis [name].  I connected with  Alexis [name] on 05/12/22 by a audio enabled telemedicine application and verified that I am speaking with the correct person using two identifiers.  Patient Location: Home  Provider Location: Office/Clinic  I discussed the limitations of evaluation and management by telemedicine. The patient expressed understanding and agreed to proceed.   Hearing/Vision screen Hearing Screening - Comments:: Patient is able to hear conversational tones without difficulty.  No issues reported.   Vision Screening - Comments:: Followed by [facility]. Wears glasses.   Dietary issues and exercise activities discussed: Current Exercise Habits: Home exercise routine, Type of exercise: walking, Time (Minutes): 60, Frequency (Times/Week): 3, Weekly Exercise (Minutes/Week): 180, Intensity: Mild Diabetic  Good water intake   Goals Addressed             This Visit's Progress    Increase physical activity       Start exercise program at the gym       Depression Screen    05/12/2022   12:46 PM 05/11/2021   12:05 PM 02/21/2019    2:05 PM 02/15/2018    3:11 PM 01/24/2017   12:13 PM  PHQ 2/9 Scores  PHQ - 2 Score 0 1 0 0 0  PHQ- 9 Score   0 0 0    Fall Risk    05/12/2022   12:50 PM 05/11/2021   12:02 PM 02/21/2019    2:05 PM 02/15/2018    3:11 PM 01/24/2017   12:13 PM  [place] in the past year?  0 0 0 No No  Number falls in past yr: 0 0 0    Injury with Fall? 0 0 0    Risk for fall due to :  Other (Comment) Medication side effect    Risk for fall due to: Comment  fell after getting off lawn mower     Follow up Falls evaluation completed;Falls prevention discussed Falls prevention  discussed Falls evaluation completed;Falls prevention discussed      FALL RISK PREVENTION PERTAINING TO THE HOME: Home free of loose throw rugs in walkways, pet beds, electrical cords, etc? Yes  Adequate lighting in your home to reduce risk of falls? Yes   ASSISTIVE DEVICES UTILIZED TO PREVENT FALLS: Life alert? No  Use of a cane, walker or w/c? No  Grab bars in the bathroom? No  Shower chair or bench in shower? Yes  Elevated toilet seat or a handicapped toilet? Yes   TIMED UP AND GO: Was the test performed? No .   Cognitive Function:    02/21/2019    2:07 PM 02/15/2018    3:10 PM 01/24/2017   12:13 PM  MMSE - Mini Mental State Exam  Orientation to time '5 5 5  '$ Orientation to Place '5 5 5  '$ Registration '3 3 3  '$ Attention/ Calculation 5 0 0  Recall '3 3 3  '$ Language- name 2 objects  0 0  Language- repeat '1 1 1  '$ Language- follow 3 step command  3 3  Language- read & follow direction  0 0  Write a sentence  0 0  Copy design  0 0  Total score  20 20        05/12/2022   12:55 PM  6CIT Screen  What Year? 0 points  What month? 0 points  What time? 0 points  Count back from 20 0 points  Months in reverse 0 points  Repeat phrase 0 points  Total Score 0 points    Immunizations Immunization History  Administered Date(s) Administered   Fluad Quad(high Dose 65+) 02/21/2019, 02/19/2020, 06/01/2021, 03/14/2022   Influenza Split 02/15/2011   Influenza Whole 02/18/2013   Influenza,inj,Quad PF,6+ Mos 05/29/2015, 02/02/2016, 01/27/2017, 02/15/2018   Moderna Sars-Covid-2 Vaccination 05/31/2019, 06/28/2019, 03/06/2020   Pneumococcal Conjugate-13 11/13/2013   Pneumococcal Polysaccharide-23 04/18/2008, 08/31/2015   Tdap 08/22/2019   Shingrix Completed?: No.    Education has been provided regarding the importance of this vaccine. Patient has been advised to call insurance company to determine out of pocket expense if they have not yet received this vaccine. Advised may also receive  vaccine at local pharmacy or Health Dept. Verbalized acceptance and understanding.  Screening Tests Health Maintenance  Topic Date Due   Diabetic kidney evaluation - Urine ACR  Never done   COLONOSCOPY (Pts 45-31yr Insurance coverage will need to be confirmed)  01/20/2022   Diabetic kidney evaluation - eGFR measurement  06/01/2022   COVID-19 Vaccine (4 - 2023-24 season) 05/28/2022 (Originally 12/17/2021)   Zoster Vaccines- Shingrix (1 of 2) 08/11/2022 (Originally 06/11/2000)   FOOT EXAM  09/08/2022   HEMOGLOBIN A1C  09/12/2022   OPHTHALMOLOGY EXAM  12/22/2022   MAMMOGRAM  01/07/2023   Medicare Annual Wellness (AWV)  05/13/2023   DTaP/Tdap/Td (2 - Td or Tdap) 08/21/2029   Pneumonia Vaccine 72 Years old  Completed   INFLUENZA VACCINE  Completed   DEXA SCAN  Completed   Hepatitis C Screening  Completed   HPV VACCINES  Aged Out    Health Maintenance Health Maintenance Due  Topic Date Due   Diabetic kidney evaluation - Urine ACR  Never done   COLONOSCOPY (Pts 45-46yr Insurance coverage will need to be confirmed)  01/20/2022   Diabetic kidney evaluation - eGFR measurement  06/01/2022   Colonoscopy- agrees to schedule.   Mammogram- deferred due to audio visit  Lung Cancer Screening: (Low Dose CT Chest recommended if Age 23107-80years, 30 pack-year currently smoking OR have quit w/in 15years.) does not qualify.   Hepatitis C Screening: Completed 05/2015.  Vision Screening: Recommended annual ophthalmology exams for early detection of glaucoma and other disorders of the eye.  Dental Screening: Recommended annual dental exams for proper oral hygiene.  Community Resource Referral / Chronic Care Management: CRR required this visit?  No   CCM required this visit?  No      Plan:     I have personally reviewed and noted the following in the patient's chart:   Medical and social history Use of alcohol, tobacco or illicit drugs  Current medications and supplements including opioid  prescriptions. Patient is not currently taking opioid prescriptions. Functional ability and status Nutritional status Physical activity Advanced directives List of other physicians Hospitalizations, surgeries, and ER visits in previous 12 months Vitals Screenings to include cognitive, depression, and falls Referrals and appointments  In addition, I have reviewed and discussed with patient certain preventive protocols, quality metrics, and best practice recommendations. A written personalized care plan for preventive services as well as general preventive health recommendations were provided to patient.     DLeta Jungling LPN   14/14/2395

## 2022-05-12 NOTE — Patient Instructions (Addendum)
Ms. Matar , Thank you for taking time to come for your Medicare Wellness Visit. I appreciate your ongoing commitment to your health goals. Please review the following plan we discussed and let me know if I can assist you in the future.   These are the goals we discussed:  Goals Addressed             This Visit's Progress    Increase physical activity       Start exercise program at the gym         This is a list of the screening recommended for you and due dates:  Health Maintenance  Topic Date Due   Yearly kidney health urinalysis for diabetes  Never done   Colon Cancer Screening  01/20/2022   Yearly kidney function blood test for diabetes  06/01/2022   COVID-19 Vaccine (4 - 2023-24 season) 05/28/2022*   Zoster (Shingles) Vaccine (1 of 2) 08/11/2022*   Complete foot exam   09/08/2022   Hemoglobin A1C  09/12/2022   Eye exam for diabetics  12/22/2022   Mammogram  01/07/2023   Medicare Annual Wellness Visit  05/13/2023   DTaP/Tdap/Td vaccine (2 - Td or Tdap) 08/21/2029   Pneumonia Vaccine  Completed   Flu Shot  Completed   DEXA scan (bone density measurement)  Completed   Hepatitis C Screening: USPSTF Recommendation to screen - Ages 74-79 yo.  Completed   HPV Vaccine  Aged Out  *Topic was postponed. The date shown is not the original due date.    Conditions/risks identified: none new  Next appointment: Follow up in one year for your annual wellness visit    Preventive Care 65 Years and Older, Female Preventive care refers to lifestyle choices and visits with your health care provider that can promote health and wellness. What does preventive care include? A yearly physical exam. This is also called an annual well check. Dental exams once or twice a year. Routine eye exams. Ask your health care provider how often you should have your eyes checked. Personal lifestyle choices, including: Daily care of your teeth and gums. Regular physical activity. Eating a healthy  diet. Avoiding tobacco and drug use. Limiting alcohol use. Practicing safe sex. Taking low-dose aspirin every day. Taking vitamin and mineral supplements as recommended by your health care provider. What happens during an annual well check? The services and screenings done by your health care provider during your annual well check will depend on your age, overall health, lifestyle risk factors, and family history of disease. Counseling  Your health care provider may ask you questions about your: Alcohol use. Tobacco use. Drug use. Emotional well-being. Home and relationship well-being. Sexual activity. Eating habits. History of falls. Memory and ability to understand (cognition). Work and work Statistician. Reproductive health. Screening  You may have the following tests or measurements: Height, weight, and BMI. Blood pressure. Lipid and cholesterol levels. These may be checked every 5 years, or more frequently if you are over 31 years old. Skin check. Lung cancer screening. You may have this screening every year starting at age 93 if you have a 30-pack-year history of smoking and currently smoke or have quit within the past 15 years. Fecal occult blood test (FOBT) of the stool. You may have this test every year starting at age 25. Flexible sigmoidoscopy or colonoscopy. You may have a sigmoidoscopy every 5 years or a colonoscopy every 10 years starting at age 109. Hepatitis C blood test. Hepatitis B blood test.  Sexually transmitted disease (STD) testing. Diabetes screening. This is done by checking your blood sugar (glucose) after you have not eaten for a while (fasting). You may have this done every 1-3 years. Bone density scan. This is done to screen for osteoporosis. You may have this done starting at age 12. Mammogram. This may be done every 1-2 years. Talk to your health care provider about how often you should have regular mammograms. Talk with your health care provider about  your test results, treatment options, and if necessary, the need for more tests. Vaccines  Your health care provider may recommend certain vaccines, such as: Influenza vaccine. This is recommended every year. Tetanus, diphtheria, and acellular pertussis (Tdap, Td) vaccine. You may need a Td booster every 10 years. Zoster vaccine. You may need this after age 19. Pneumococcal 13-valent conjugate (PCV13) vaccine. One dose is recommended after age 58. Pneumococcal polysaccharide (PPSV23) vaccine. One dose is recommended after age 32. Talk to your health care provider about which screenings and vaccines you need and how often you need them. This information is not intended to replace advice given to you by your health care provider. Make sure you discuss any questions you have with your health care provider. Document Released: 05/01/2015 Document Revised: 12/23/2015 Document Reviewed: 02/03/2015 Elsevier Interactive Patient Education  2017 [place] Prevention in the Home Falls can cause injuries. They can happen to people of all ages. There are many things you can do to make your home safe and to help prevent falls. What can I do on the outside of my home? Regularly fix the edges of walkways and driveways and fix any cracks. Remove anything that might make you trip as you walk through a door, such as a raised step or threshold. Trim any bushes or trees on the path to your home. Use bright outdoor lighting. Clear any walking paths of anything that might make someone trip, such as rocks or tools. Regularly check to see if handrails are loose or broken. Make sure that both sides of any steps have handrails. Any raised decks and porches should have guardrails on the edges. Have any leaves, snow, or ice cleared regularly. Use sand or salt on walking paths during winter. Clean up any spills in your garage right away. This includes oil or grease spills. What can I do in the bathroom? Use  night lights. Install grab bars by the toilet and in the tub and shower. Do not use towel bars as grab bars. Use non-skid mats or decals in the tub or shower. If you need to sit down in the shower, use a plastic, non-slip stool. Keep the floor dry. Clean up any water that spills on the floor as soon as it happens. Remove soap buildup in the tub or shower regularly. Attach bath mats securely with double-sided non-slip rug tape. Do not have throw rugs and other things on the floor that can make you trip. What can I do in the bedroom? Use night lights. Make sure that you have a light by your bed that is easy to reach. Do not use any sheets or blankets that are too big for your bed. They should not hang down onto the floor. Have a firm chair that has side arms. You can use this for support while you get dressed. Do not have throw rugs and other things on the floor that can make you trip. What can I do in the kitchen? Clean up any spills right away.  Avoid walking on wet floors. Keep items that you use a lot in easy-to-reach places. If you need to reach something above you, use a strong step stool that has a grab bar. Keep electrical cords out of the way. Do not use floor polish or wax that makes floors slippery. If you must use wax, use non-skid floor wax. Do not have throw rugs and other things on the floor that can make you trip. What can I do with my stairs? Do not leave any items on the stairs. Make sure that there are handrails on both sides of the stairs and use them. Fix handrails that are broken or loose. Make sure that handrails are as long as the stairways. Check any carpeting to make sure that it is firmly attached to the stairs. Fix any carpet that is loose or worn. Avoid having throw rugs at the top or bottom of the stairs. If you do have throw rugs, attach them to the floor with carpet tape. Make sure that you have a light switch at the top of the stairs and the bottom of the  stairs. If you do not have them, ask someone to add them for you. What else can I do to help prevent falls? Wear shoes that: Do not have high heels. Have rubber bottoms. Are comfortable and fit you well. Are closed at the toe. Do not wear sandals. If you use a stepladder: Make sure that it is fully opened. Do not climb a closed stepladder. Make sure that both sides of the stepladder are locked into place. Ask someone to hold it for you, if possible. Clearly mark and make sure that you can see: Any grab bars or handrails. First and last steps. Where the edge of each step is. Use tools that help you move around (mobility aids) if they are needed. These include: Canes. Walkers. Scooters. Crutches. Turn on the lights when you go into a dark area. Replace any light bulbs as soon as they burn out. Set up your furniture so you have a clear path. Avoid moving your furniture around. If any of your floors are uneven, fix them. If there are any pets around you, be aware of where they are. Review your medicines with your doctor. Some medicines can make you feel dizzy. This can increase your chance of falling. Ask your doctor what other things that you can do to help prevent falls. This information is not intended to replace advice given to you by your health care provider. Make sure you discuss any questions you have with your health care provider. Document Released: 01/29/2009 Document Revised: 09/10/2015 Document Reviewed: 05/09/2014 Elsevier Interactive Patient Education  2017 [employer].

## 2022-05-30 ENCOUNTER — Telehealth: Payer: Self-pay

## 2022-05-30 ENCOUNTER — Other Ambulatory Visit (HOSPITAL_COMMUNITY): Payer: Self-pay

## 2022-05-30 NOTE — Telephone Encounter (Signed)
[employer] sent letter that insulin degludec (TRESIBA FLEXTOUCH) 100 UNIT/ML FlexTouch Pen was no longer covered. Dr. [name] is requesting that a PA be started for this medication for patient.

## 2022-06-01 ENCOUNTER — Other Ambulatory Visit (HOSPITAL_COMMUNITY): Payer: Self-pay

## 2022-06-06 ENCOUNTER — Other Ambulatory Visit: Payer: Self-pay | Admitting: Family Medicine

## 2022-06-06 ENCOUNTER — Other Ambulatory Visit (HOSPITAL_COMMUNITY): Payer: Self-pay

## 2022-06-07 ENCOUNTER — Other Ambulatory Visit (HOSPITAL_COMMUNITY): Payer: Self-pay

## 2022-06-07 NOTE — Telephone Encounter (Signed)
Patient Teacher, English as a foreign language completed.    The current test claim came back as a paid claim. Prior authorization was not needed.  The patient is insured through [place] [employer].

## 2022-06-13 ENCOUNTER — Telehealth: Payer: Self-pay | Admitting: Family Medicine

## 2022-06-13 NOTE — Telephone Encounter (Signed)
Pt called stating she needs a refill for the meds, insulin degludec (TRESIBA FLEXTOUCH) 100 UNIT/ML FlexTouch Pen but was asked by pharmacy to contact [name] to see if he wanted the pt to stay on Tresiba & if so, [name] needs to let pharmacy know? Call back # [phone]

## 2022-06-14 NOTE — Telephone Encounter (Signed)
Which pharmacy does she want to use?  Please let me know and I'll send the rx.  Thanks.

## 2022-06-14 NOTE — Telephone Encounter (Signed)
Tried to call patient but vm is not set up and could not leave message.

## 2022-06-14 NOTE — Addendum Note (Signed)
Addended by: [name] on: 06/14/2022 07:27 AM   Modules accepted: Orders

## 2022-06-17 NOTE — Telephone Encounter (Signed)
Spoke with patient and she was already able to get rx filled. Nothing further needed from [place].

## 2022-06-25 ENCOUNTER — Other Ambulatory Visit: Payer: Self-pay | Admitting: Family Medicine

## 2022-06-25 DIAGNOSIS — E1140 Type 2 diabetes mellitus with diabetic neuropathy, unspecified: Secondary | ICD-10-CM

## 2022-07-07 ENCOUNTER — Other Ambulatory Visit (INDEPENDENT_AMBULATORY_CARE_PROVIDER_SITE_OTHER): Payer: Medicare HMO

## 2022-07-07 DIAGNOSIS — Z794 Long term (current) use of insulin: Secondary | ICD-10-CM

## 2022-07-07 DIAGNOSIS — E1140 Type 2 diabetes mellitus with diabetic neuropathy, unspecified: Secondary | ICD-10-CM | POA: Diagnosis not present

## 2022-07-07 LAB — LIPID PANEL
Cholesterol: 106 mg/dL (ref 0–200)
HDL: 32.3 mg/dL — ABNORMAL LOW (ref >39.00)
LDL Cholesterol: 46 mg/dL (ref 0–99)
NonHDL: 73.46
Total CHOL/HDL Ratio: 3
Triglycerides: 135 mg/dL (ref 0.0–149.0)
VLDL: 27 mg/dL (ref 0.0–40.0)

## 2022-07-07 LAB — CBC WITH DIFFERENTIAL/PLATELET
Basophils Absolute: 0.1 10*3/uL (ref 0.0–0.1)
Basophils Relative: 0.6 % (ref 0.0–3.0)
Eosinophils Absolute: 0.5 10*3/uL (ref 0.0–0.7)
Eosinophils Relative: 4.5 % (ref 0.0–5.0)
HCT: 42 % (ref 36.0–46.0)
Hemoglobin: 13.5 g/dL (ref 12.0–15.0)
Lymphocytes Relative: 21.6 % (ref 12.0–46.0)
Lymphs Abs: 2.4 10*3/uL (ref 0.7–4.0)
MCHC: 32.1 g/dL (ref 30.0–36.0)
MCV: 84.7 fl (ref 78.0–100.0)
Monocytes Absolute: 0.7 10*3/uL (ref 0.1–1.0)
Monocytes Relative: 6.7 % (ref 3.0–12.0)
Neutro Abs: 7.3 10*3/uL (ref 1.4–7.7)
Neutrophils Relative %: 66.6 % (ref 43.0–77.0)
Platelets: 274 10*3/uL (ref 150.0–400.0)
RBC: 4.96 Mil/uL (ref 3.87–5.11)
RDW: 14 % (ref 11.5–15.5)
WBC: 11 10*3/uL — ABNORMAL HIGH (ref 4.0–10.5)

## 2022-07-07 LAB — COMPREHENSIVE METABOLIC PANEL
ALT: 9 U/L (ref 0–35)
AST: 15 U/L (ref 0–37)
Albumin: 4 g/dL (ref 3.5–5.2)
Alkaline Phosphatase: 100 U/L (ref 39–117)
BUN: 17 mg/dL (ref 6–23)
CO2: 33 mEq/L — ABNORMAL HIGH (ref 19–32)
Calcium: 9.8 mg/dL (ref 8.4–10.5)
Chloride: 100 mEq/L (ref 96–112)
Creatinine, Ser: 0.73 mg/dL (ref 0.40–1.20)
GFR: 82.36 mL/min (ref >60.00)
Glucose, Bld: 150 mg/dL — ABNORMAL HIGH (ref 70–99)
Potassium: 4.3 mEq/L (ref 3.5–5.1)
Sodium: 140 mEq/L (ref 135–145)
Total Bilirubin: 0.5 mg/dL (ref 0.2–1.2)
Total Protein: 7.7 g/dL (ref 6.0–8.3)

## 2022-07-07 LAB — VITAMIN B12: Vitamin B-12: 170 pg/mL — ABNORMAL LOW (ref 211–911)

## 2022-07-07 LAB — HEMOGLOBIN A1C: Hgb A1c MFr Bld: 8.9 % — ABNORMAL HIGH (ref 4.6–6.5)

## 2022-07-07 LAB — MICROALBUMIN / CREATININE URINE RATIO
Creatinine,U: 94 mg/dL
Microalb Creat Ratio: 6.6 mg/g (ref 0.0–30.0)
Microalb, Ur: 6.2 mg/dL — ABNORMAL HIGH (ref 0.0–1.9)

## 2022-07-14 ENCOUNTER — Encounter: Payer: Self-pay | Admitting: Family Medicine

## 2022-07-14 ENCOUNTER — Ambulatory Visit (INDEPENDENT_AMBULATORY_CARE_PROVIDER_SITE_OTHER): Payer: Medicare HMO | Admitting: Family Medicine

## 2022-07-14 VITALS — BP 110/78 | HR 62 | Temp 97.5°F | Ht 62.0 in | Wt 208.0 lb

## 2022-07-14 DIAGNOSIS — E538 Deficiency of other specified B group vitamins: Secondary | ICD-10-CM

## 2022-07-14 DIAGNOSIS — Z794 Long term (current) use of insulin: Secondary | ICD-10-CM | POA: Diagnosis not present

## 2022-07-14 DIAGNOSIS — E1140 Type 2 diabetes mellitus with diabetic neuropathy, unspecified: Secondary | ICD-10-CM | POA: Diagnosis not present

## 2022-07-14 MED ORDER — ENALAPRIL MALEATE 10 MG PO TABS
10.0000 mg | ORAL_TABLET | Freq: Every day | ORAL | 3 refills | Status: DC
Start: 2022-07-14 — End: 2023-02-13

## 2022-07-14 MED ORDER — VITAMIN B-12 1000 MCG PO TABS: 1000.0000 ug | ORAL_TABLET | Freq: Every day | ORAL | Status: AC

## 2022-07-14 MED ORDER — NOVOFINE PEN NEEDLE 32G X 6 MM MISC: 3 refills | Status: DC

## 2022-07-14 MED ORDER — TRESIBA FLEXTOUCH 100 UNIT/ML ~~LOC~~ SOPN: 55.0000 [IU] | PEN_INJECTOR | Freq: Every day | SUBCUTANEOUS | 3 refills | Status: DC

## 2022-07-14 NOTE — Progress Notes (Signed)
Diabetes:  Using medications without difficulties: yes Hypoglycemic episodes:no Hyperglycemic episodes: no Feet problems: no change in baseline mild tingling.   Blood Sugars averaging: usually ~80 in the AMs, up to 130s.  eye exam within last year: yes MALB positive.   Had been on metformin daily.   Has been taking 55 units insulin daily   Intentional weight loss noted.   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  I thanked her for her effort.  She is still active in church.  She hopes to get her lawnmower fixed so she can get her yardwork done.    B12 low.  Discussed replacement and recheck.    Has been taking enalapril 10mg  a day.  Rx sent.  BP controlled.    Meds, vitals, and allergies reviewed.  ROS: Per HPI unless specifically indicated in ROS section   GEN: nad, alert and oriented HEENT: ncat NECK: supple w/o LA CV: rrr. PULM: ctab, no inc wob ABD: soft, +bs EXT: trace BLE edema SKIN: well perfused.  Tremor noted at baseline.

## 2022-07-14 NOTE — Patient Instructions (Addendum)
Thanks for your effort.  Try taking metformin twice a day and add on B12.   Recheck labs in about 3 months prior to a visit.  Take care.  Glad to see you.

## 2022-07-17 DIAGNOSIS — E538 Deficiency of other specified B group vitamins: Secondary | ICD-10-CM | POA: Insufficient documentation

## 2022-07-17 NOTE — Assessment & Plan Note (Signed)
Add on B12 daily and we can recheck labs periodically.

## 2022-07-17 NOTE — Assessment & Plan Note (Signed)
MALB positive.   Had been on metformin daily.   Has been taking 55 units insulin daily   Intentional weight loss noted.  Discussed increasing metformin to twice daily and recheck in a few months.  See after visit summary.  She agrees to plan.

## 2022-07-22 ENCOUNTER — Other Ambulatory Visit: Payer: Self-pay | Admitting: Family Medicine

## 2022-07-22 ENCOUNTER — Other Ambulatory Visit: Payer: Self-pay | Admitting: Cardiovascular Disease

## 2022-07-22 NOTE — Telephone Encounter (Signed)
Refill request for gabapentin (NEURONTIN) 400 MG capsule   LOV - 07/14/22 Next OV - 10/14/22 Last refill - 06/01/22 #90/3

## 2022-07-22 NOTE — Telephone Encounter (Signed)
Pt scheduled on 4/30

## 2022-07-22 NOTE — Telephone Encounter (Signed)
Please contact pt for future appointment. Pt overdue for f/u. Pt needing refills. 

## 2022-08-08 ENCOUNTER — Telehealth: Payer: Self-pay | Admitting: Family Medicine

## 2022-08-08 NOTE — Telephone Encounter (Signed)
Please start PA on [place] for patient

## 2022-08-08 NOTE — Telephone Encounter (Signed)
Patient contacted the office stating she received a letter stating her insulin/ needled will no longer be covered through [place]. Wants to know if there is a way to prescribe her an alternative that insurance may cover or maybe have this shown as a necessary medication for her. Also states she has one full pen and one dose left so will need a refill or alternative soon, if needed advise [phone].

## 2022-08-09 ENCOUNTER — Other Ambulatory Visit (HOSPITAL_COMMUNITY): Payer: Self-pay

## 2022-08-09 NOTE — Telephone Encounter (Signed)
Patient contacted the office regarding this, stated she is down to one pen -34 units. States she is running low and wanted to keep [place] informed of this. Informed the patient the message has been passed on and sent to rx prior auth team, pt expressed understanding and gratitude.

## 2022-08-11 ENCOUNTER — Other Ambulatory Visit (HOSPITAL_COMMUNITY): Payer: Self-pay

## 2022-08-11 NOTE — Telephone Encounter (Addendum)
Ran test claim, received paid claim.   Initiated PA via CMM, came back available without authorization. May have to wait until [facility] officially stops covering to run PA.

## 2022-08-11 NOTE — Telephone Encounter (Signed)
Called patient and advised her that insurance is still currently covering her medication per test claim that was done. I asked her if there was a date on her letter that said when they would stop covering the medication but she did not see one. I advised her to try to get it filled; it may be over at the end of the month.

## 2022-08-15 NOTE — Progress Notes (Unsigned)
Cardiology Office Note  Date:  08/16/2022   ID:  [name], [name] 1950-10-06, MRN [id number]  PCP:  Joaquim Nam, MD   Chief Complaint  Patient presents with   12 month follow up     "Doing well." Medications reviewed by the patient verbally.     HPI:  [name] is a pleasant 72 year old woman with past medical history of HTN hyperlipidemia Diabetes 2 on insulin, HBA1C in the 8 range Significant aortic atherosclerosis extending through her iliac arteries including aorta and coronaries Presenting for follow-up of her chest pain, PAD, aortic atherosclerosis  LOV 2023 Active in the yard  Sugars low in the Am On one shot a day  Meeting with PMD Crestor down to 5 mg Enalapril down to 10  Husband passed 03/06/21, renal failure, pulmonary fibrosis adjusting  No regular exercise program  Lab work reviewed Total chol 106 LDL 46 A1c 8.9, on insulin  EKG personally reviewed by myself on todays visit NSR rate 75 bpm, no ST or T wave changes   Other past medical history reviewed Stress test 07/2017  Pharmacological myocardial perfusion imaging study with no significant ischemia Non-attenuation corrected images with breast attenuation artifact and GI uptake artifact  GI uptake artifact noted on attenuation corrected images, making it difficult to interpret perfusion in the inferior wall.  Likely normal Normal wall motion, EF estimated at 63% No EKG changes concerning for ischemia at peak stress or in recovery. Low risk scan  Family hx Mother with valve disease, 63, lived until 27, atrial fib Father: sepsis, aspiration  PMH:   has a past medical history of Allergy, Asthma, Benign tumor of pelvic bones, sacrum, and coccyx, Blood in stool, Chest pain, Diabetes mellitus, GERD (gastroesophageal reflux disease), colonic polyps, Hyperlipidemia, Hypertension, Morbid obesity (HCC), Sleep apnea, and Tremor.  PSH:    Past Surgical History:  Procedure Laterality Date    ABDOMINAL HYSTERECTOMY  1988   Surgery included removal of benign pelvic tumor   COLONOSCOPY  01/20/2017   LAPAROSCOPIC APPENDECTOMY N/A 10/30/2015   Procedure: APPENDECTOMY LAPAROSCOPIC, lysis of adhesions;  Surgeon: Gladis Riffle, MD;  Location: [facility] ORS;  Service: General;  Laterality: N/A;   OVARY BIOPSY  1988   pelvic bone tumor  1988   TONSILLECTOMY AND ADENOIDECTOMY  1957   UPPER GI ENDOSCOPY  01/20/2017    Current Outpatient Medications  Medication Sig Dispense Refill   Alcohol Swabs (B-D SINGLE USE SWABS REGULAR) PADS Use as instructed to check blood sugar twice daily.  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 200 each 5   aspirin EC 81 MG tablet Take 81 mg by mouth daily.     Blood Glucose Calibration (TRUE METRIX LEVEL 2) Normal SOLN Use as instructed to check blood sugar meter.  Diagnosis:  E11.9  Insulin dependent 1 each 3   Blood Glucose Monitoring Suppl (TRUE METRIX METER) w/Device KIT Use to check blood sugar twice daily.  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 1 kit 0   cyanocobalamin (VITAMIN B12) 1000 MCG tablet Take 1 tablet (1,000 mcg total) by mouth daily.     DROPLET INSULIN SYRINGE 30G X 5/16" 0.5 ML MISC USE TO INJECT INSULIN TWICE DAILY AS DIRECTED 200 each 2   enalapril (VASOTEC) 10 MG tablet Take 1 tablet (10 mg total) by mouth daily. 90 tablet 3   ezetimibe (ZETIA) 10 MG tablet TAKE 1 TABLET EVERY DAY 90 tablet 0   gabapentin (NEURONTIN) 400 MG capsule TAKE 1 CAPSULE  AT BEDTIME 90 capsule 3   insulin degludec (TRESIBA FLEXTOUCH) 100 UNIT/ML FlexTouch Pen Inject 55-60 Units into the skin daily. 20 mL 3   ipratropium (ATROVENT) 0.06 % nasal spray Place 2 sprays into both nostrils 4 (four) times daily. 15 mL 12   loratadine (CLARITIN) 10 MG tablet Take 1 tablet (10 mg total) by mouth daily.     metFORMIN (GLUCOPHAGE) 1000 MG tablet TAKE 1 TABLET TWICE DAILY WITH MEALS 180 tablet 3   nystatin (MYCOSTATIN/NYSTOP) powder Apply 1 application topically 3 (three) times  daily. 30 g 1   omeprazole (PRILOSEC) 20 MG capsule Take 20 mg by mouth daily.     Probiotic Product (ALIGN) 4 MG CAPS Take 1 capsule by mouth daily.     propranolol (INDERAL) 20 MG tablet TAKE 1 TABLET TWICE DAILY 180 tablet 3   rosuvastatin (CRESTOR) 10 MG tablet TAKE 1/2 TABLET EVERY DAY 45 tablet 0   TRUEplus Lancets 33G MISC Use as instructed to check blood sugar twice daily.  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 200 each 3   glucose blood (TRUE METRIX BLOOD GLUCOSE TEST) test strip Use as instructed to check blood sugar twice daily.  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 (Patient not taking: Reported on 08/16/2022) 200 each 3   Insulin Pen Needle (NOVOFINE PEN NEEDLE) 32G X 6 MM MISC Use as directed to inject [place] (Patient not taking: Reported on 08/16/2022) 100 each 3   No current facility-administered medications for this visit.     Allergies:   Milk-related compounds, Peanut-containing drug products, and Statins   Social History:  The patient  reports that she has never smoked. She has never used smokeless tobacco. She reports that she does not drink alcohol and does not use drugs.   Family History:   family history includes Colon cancer in her father and sister; Heart disease in her mother; Lung cancer in her maternal grandfather; Uterine cancer in her sister.   Review of Systems: Review of Systems  Constitutional: Negative.   HENT: Negative.    Respiratory: Negative.    Cardiovascular: Negative.   Gastrointestinal: Negative.   Musculoskeletal: Negative.   Neurological: Negative.   Psychiatric/Behavioral: Negative.    All other systems reviewed and are negative.   PHYSICAL EXAM: VS:  BP (!) 140/62 (BP Location: Left Arm, Patient Position: Sitting, Cuff Size: Normal)   Pulse 75   Ht 5\' 2"  (1.575 m)   Wt 209 lb (94.8 kg)   SpO2 97%   BMI 38.23 kg/m  , BMI Body mass index is 38.23 kg/m. Constitutional:  oriented to person, place, and time. No distress.  HENT:   Head: Grossly normal Eyes:  no discharge. No scleral icterus.  Neck: No JVD, no carotid bruits  Cardiovascular: Regular rate and rhythm, no murmurs appreciated Pulmonary/Chest: Clear to auscultation bilaterally, no wheezes or rails Abdominal: Soft.  no distension.  no tenderness.  Musculoskeletal: Normal range of motion Neurological:  normal muscle tone. Coordination normal. No atrophy Skin: Skin warm and dry Psychiatric: normal affect, pleasant  Recent Labs: 07/07/2022: ALT 9; BUN 17; Creatinine, Ser 0.73; Hemoglobin 13.5; Platelets 274.0; Potassium 4.3; Sodium 140    Lipid Panel Lab Results  Component Value Date   CHOL 106 07/07/2022   HDL 32.30 (L) 07/07/2022   LDLCALC 46 07/07/2022   TRIG 135.0 07/07/2022      Wt Readings from Last 3 Encounters:  08/16/22 209 lb (94.8 kg)  07/14/22 208 lb (94.3 kg)  05/12/22 217  lb (98.4 kg)     ASSESSMENT AND PLAN:  Type 2 diabetes mellitus with other circulatory complication, with long-term current use of insulin (HCC) CT scan with significant coronary calcification and aortic atherosclerosis extending through her iliac arteries Again we have discussed aggressive diabetes control, weight loss through calorie restriction Cholesterol at goal  Exertional chest pain -CAD with stable angina Recommended walking program for conditioning, she is active in her yard Currently with no symptoms of angina. No further workup at this time. Continue current medication regimen.  Aortic atherosclerosis (HCC) Significant aortic atherosclerosis extending through her iliac arteries including aorta and coronaries Diabetes remains an issue, A1c  8 range, low carbohydrates recommended Diet and walking program discussed  shortness of breath Exercise on a regular basis for conditioning  Mixed hyperlipidemia Cholesterol is at goal on the current lipid regimen. No changes to the medications were made.    Total encounter time more than 30 minutes   Greater than 50% was spent in counseling and coordination of care with the patient   No orders of the defined types were placed in this encounter.   Signed, Dossie Arbour, M.D., Ph.D. 08/16/2022  [facility] [place], [place] [phone]

## 2022-08-16 ENCOUNTER — Ambulatory Visit: Payer: Medicare HMO | Attending: Cardiovascular Disease | Admitting: Cardiovascular Disease

## 2022-08-16 ENCOUNTER — Encounter: Payer: Self-pay | Admitting: Cardiovascular Disease

## 2022-08-16 VITALS — BP 140/62 | HR 75 | Ht 62.0 in | Wt 209.0 lb

## 2022-08-16 DIAGNOSIS — E1140 Type 2 diabetes mellitus with diabetic neuropathy, unspecified: Secondary | ICD-10-CM | POA: Diagnosis not present

## 2022-08-16 DIAGNOSIS — I700 Atherosclerosis of aorta: Secondary | ICD-10-CM

## 2022-08-16 DIAGNOSIS — T466X5D Adverse effect of antihyperlipidemic and antiarteriosclerotic drugs, subsequent encounter: Secondary | ICD-10-CM | POA: Diagnosis not present

## 2022-08-16 DIAGNOSIS — E782 Mixed hyperlipidemia: Secondary | ICD-10-CM | POA: Diagnosis not present

## 2022-08-16 DIAGNOSIS — T466X5A Adverse effect of antihyperlipidemic and antiarteriosclerotic drugs, initial encounter: Secondary | ICD-10-CM | POA: Diagnosis not present

## 2022-08-16 DIAGNOSIS — I10 Essential (primary) hypertension: Secondary | ICD-10-CM

## 2022-08-16 DIAGNOSIS — M791 Myalgia, unspecified site: Secondary | ICD-10-CM | POA: Diagnosis not present

## 2022-08-16 DIAGNOSIS — Z794 Long term (current) use of insulin: Secondary | ICD-10-CM | POA: Diagnosis not present

## 2022-08-16 DIAGNOSIS — I2510 Atherosclerotic heart disease of native coronary artery without angina pectoris: Secondary | ICD-10-CM | POA: Diagnosis not present

## 2022-08-16 NOTE — Patient Instructions (Signed)
Medication Instructions:  No changes  If you need a refill on your cardiac medications before your next appointment, please call your pharmacy.   Lab work: No new labs needed  Testing/Procedures: No new testing needed  Follow-Up: At [facility], you and your health needs are our priority.  As part of our continuing mission to provide you with exceptional heart care, we have created designated Provider Care Teams.  These Care Teams include your primary Cardiologist (physician) and Advanced Practice Providers (APPs -  Physician Assistants and Nurse Practitioners) who all work together to provide you with the care you need, when you need it.  You will need a follow up appointment in 12 months  Providers on your designated Care Team:   Christopher Berge, NP Ryan Dunn, PA-C Cadence Furth, PA-C  COVID-19 Vaccine Information can be found at: [email or web address] For questions related to vaccine distribution or appointments, please email [email or web address] or call [phone].   

## 2022-09-08 ENCOUNTER — Encounter: Payer: Self-pay | Admitting: Family Medicine

## 2022-09-08 ENCOUNTER — Ambulatory Visit (INDEPENDENT_AMBULATORY_CARE_PROVIDER_SITE_OTHER): Payer: Medicare HMO | Admitting: Family Medicine

## 2022-09-08 VITALS — BP 118/78 | HR 75 | Temp 98.0°F | Ht 62.0 in | Wt 207.0 lb

## 2022-09-08 DIAGNOSIS — L989 Disorder of the skin and subcutaneous tissue, unspecified: Secondary | ICD-10-CM | POA: Diagnosis not present

## 2022-09-08 DIAGNOSIS — I8390 Asymptomatic varicose veins of unspecified lower extremity: Secondary | ICD-10-CM | POA: Diagnosis not present

## 2022-09-08 NOTE — Progress Notes (Signed)
Discussed potential referral to vein and vascular for veins in her left leg bothering her.  Discussed options.  Father had h/o PE. She doesn't have have DVT/PT.  No CP.  Not SOB.  She has varicose veins B legs and irritation on the medial distal shins.  Inc BLE edema as the day goes on.  Hasn't tried compression stockings.    Sugar was 83 this AM- improved from prior.    Also discussed referral for dermatology for lesions on her face and legs to get checked out.  See exam below. She had seen Dr. Adolphus Birchwood in the past.    Patient's aunt has dementia and isn't driving.  Patient is taking her to hair appointments weekly.  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  I thanked her for her effort.    Meds, vitals, and allergies reviewed.   ROS: Per HPI unless specifically indicated in ROS section   Nad Ncat Neck supple, no LA rrr L shin, medial, SK 8mm.  Benign-appearing no ulceration. Possibly SK on the R face.  6mm.  No ulceration. SKs on the R upper arm and back.  Benign appearing. Small punctate lesion left of the nose.   Trace BLE edema.  Ropy varicose vein on the L thigh.   Mild bilateral medial shin skin irritation without ulceration.

## 2022-09-08 NOTE — Patient Instructions (Addendum)
Let me know if compression stockings are not helping. You may need thigh high stockings for the varicose veins.   Let me know if you don't get a call about seeing dermatology.   Take care.  Glad to see you.

## 2022-09-12 DIAGNOSIS — I8390 Asymptomatic varicose veins of unspecified lower extremity: Secondary | ICD-10-CM | POA: Insufficient documentation

## 2022-09-12 NOTE — Assessment & Plan Note (Signed)
Reasonable to see dermatology.  Referral placed. 

## 2022-09-12 NOTE — Assessment & Plan Note (Signed)
Still okay for outpatient follow-up.  Discussed that if we referred her to the vein clinic at this point, they would likely encourage her to try compression stockings.  She will let me know if compression stockings are not helping.  She may need thigh high stockings for the varicose veins, given the distribution.

## 2022-10-07 ENCOUNTER — Other Ambulatory Visit (INDEPENDENT_AMBULATORY_CARE_PROVIDER_SITE_OTHER): Payer: Medicare HMO

## 2022-10-07 DIAGNOSIS — E538 Deficiency of other specified B group vitamins: Secondary | ICD-10-CM

## 2022-10-07 DIAGNOSIS — Z794 Long term (current) use of insulin: Secondary | ICD-10-CM

## 2022-10-07 DIAGNOSIS — E1140 Type 2 diabetes mellitus with diabetic neuropathy, unspecified: Secondary | ICD-10-CM | POA: Diagnosis not present

## 2022-10-07 LAB — VITAMIN B12: Vitamin B-12: 474 pg/mL (ref 211–911)

## 2022-10-07 LAB — HEMOGLOBIN A1C: Hgb A1c MFr Bld: 8.2 % — ABNORMAL HIGH (ref 4.6–6.5)

## 2022-10-11 ENCOUNTER — Other Ambulatory Visit: Payer: Self-pay | Admitting: Cardiovascular Disease

## 2022-10-14 ENCOUNTER — Ambulatory Visit (INDEPENDENT_AMBULATORY_CARE_PROVIDER_SITE_OTHER): Payer: Medicare HMO | Admitting: Family Medicine

## 2022-10-14 ENCOUNTER — Encounter: Payer: Self-pay | Admitting: Family Medicine

## 2022-10-14 VITALS — BP 122/78 | HR 78 | Temp 98.5°F | Ht 62.0 in | Wt 203.0 lb

## 2022-10-14 DIAGNOSIS — Z794 Long term (current) use of insulin: Secondary | ICD-10-CM

## 2022-10-14 DIAGNOSIS — E538 Deficiency of other specified B group vitamins: Secondary | ICD-10-CM | POA: Diagnosis not present

## 2022-10-14 DIAGNOSIS — E1140 Type 2 diabetes mellitus with diabetic neuropathy, unspecified: Secondary | ICD-10-CM | POA: Diagnosis not present
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
## 2022-10-14 NOTE — Patient Instructions (Addendum)
Try taking 1 metformin in the morning and at lunch.  Don't take it at night.   If you still have lows in the early morning, then stop the lunchtime dose.  Recheck in late October or thereabouts.  A1c at the visit.  Take care.  Glad to see you. Thanks for your effort.

## 2022-10-16 NOTE — Assessment & Plan Note (Signed)
  H/o B12 def, now normalized. D/w pt.  On oral replacement.  Would continue as is.

## 2022-10-16 NOTE — Assessment & Plan Note (Signed)
Taking 55 units insulin and 2 metformin a day.   Labs d/w pt.   Try taking 1 metformin in the morning and at lunch.  Don't take it at night.   If you still have lows in the early morning, then stop the lunchtime dose.  Recheck in late October or thereabouts.  A1c at the visit.

## 2022-12-06 ENCOUNTER — Telehealth: Payer: Self-pay | Admitting: Family Medicine

## 2022-12-06 NOTE — Telephone Encounter (Signed)
Prescription Request  12/06/2022  LOV: 10/14/2022  What is the name of the medication or equipment? Insulin Pen Needle (NOVOFINE PEN NEEDLE) 32G X 6 MM MISC  insulin degludec (TRESIBA FLEXTOUCH) 100 UNIT/ML FlexTouch Pen  Have you contacted your pharmacy to request a refill? Yes   [facility] [address] [place] - [address] [place] [address] Phone: [phone] Fax: [phone]    Patient notified that their request is being sent to the clinical staff for review and that they should receive a response within 2 business days.   Please advise at [facility] [phone]

## 2022-12-07 MED ORDER — NOVOFINE PEN NEEDLE 32G X 6 MM MISC: 3 refills | Status: DC

## 2022-12-07 MED ORDER — TRESIBA FLEXTOUCH 100 UNIT/ML ~~LOC~~ SOPN: 55.0000 [IU] | PEN_INJECTOR | Freq: Every day | SUBCUTANEOUS | 3 refills | Status: DC

## 2022-12-07 NOTE — Telephone Encounter (Signed)
Erxs sent 

## 2022-12-27 DIAGNOSIS — H2513 Age-related nuclear cataract, bilateral: Secondary | ICD-10-CM | POA: Diagnosis not present

## 2022-12-27 DIAGNOSIS — Z0100 Encounter for examination of eyes and vision without abnormal findings: Secondary | ICD-10-CM | POA: Diagnosis not present

## 2022-12-27 DIAGNOSIS — H40019 Open angle with borderline findings, low risk, unspecified eye: Secondary | ICD-10-CM | POA: Diagnosis not present

## 2022-12-27 DIAGNOSIS — E113293 Type 2 diabetes mellitus with mild nonproliferative diabetic retinopathy without macular edema, bilateral: Secondary | ICD-10-CM | POA: Diagnosis not present

## 2022-12-27 DIAGNOSIS — H02831 Dermatochalasis of right upper eyelid: Secondary | ICD-10-CM | POA: Diagnosis not present

## 2022-12-27 LAB — HM DIABETES EYE EXAM

## 2023-01-05 DIAGNOSIS — D2261 Melanocytic nevi of right upper limb, including shoulder: Secondary | ICD-10-CM | POA: Diagnosis not present

## 2023-01-05 DIAGNOSIS — D2271 Melanocytic nevi of right lower limb, including hip: Secondary | ICD-10-CM | POA: Diagnosis not present

## 2023-01-05 DIAGNOSIS — D225 Melanocytic nevi of trunk: Secondary | ICD-10-CM | POA: Diagnosis not present

## 2023-01-05 DIAGNOSIS — D2262 Melanocytic nevi of left upper limb, including shoulder: Secondary | ICD-10-CM | POA: Diagnosis not present

## 2023-01-05 DIAGNOSIS — L820 Inflamed seborrheic keratosis: Secondary | ICD-10-CM | POA: Diagnosis not present

## 2023-01-05 DIAGNOSIS — L821 Other seborrheic keratosis: Secondary | ICD-10-CM | POA: Diagnosis not present

## 2023-01-05 DIAGNOSIS — D2272 Melanocytic nevi of left lower limb, including hip: Secondary | ICD-10-CM | POA: Diagnosis not present

## 2023-01-05 DIAGNOSIS — D485 Neoplasm of uncertain behavior of skin: Secondary | ICD-10-CM | POA: Diagnosis not present

## 2023-02-13 ENCOUNTER — Encounter: Payer: Self-pay | Admitting: Family Medicine

## 2023-02-13 ENCOUNTER — Ambulatory Visit (INDEPENDENT_AMBULATORY_CARE_PROVIDER_SITE_OTHER): Payer: Medicare HMO | Admitting: Family Medicine

## 2023-02-13 VITALS — BP 128/68 | HR 64 | Temp 97.6°F | Ht 62.0 in | Wt 200.2 lb

## 2023-02-13 DIAGNOSIS — Z794 Long term (current) use of insulin: Secondary | ICD-10-CM | POA: Diagnosis not present

## 2023-02-13 DIAGNOSIS — Z23 Encounter for immunization: Secondary | ICD-10-CM | POA: Diagnosis not present

## 2023-02-13 DIAGNOSIS — E1140 Type 2 diabetes mellitus with diabetic neuropathy, unspecified: Secondary | ICD-10-CM

## 2023-02-13 LAB — POCT GLYCOSYLATED HEMOGLOBIN (HGB A1C): Hemoglobin A1C: 7.3 % — AB (ref 4.0–5.6)

## 2023-02-13 MED ORDER — ENALAPRIL MALEATE 10 MG PO TABS: 5.0000 mg | ORAL_TABLET | Freq: Every day | ORAL | Status: DC

## 2023-02-13 NOTE — Patient Instructions (Addendum)
Take care.  Glad to see you. Recheck at a yearly visit in about 4 months.  Labs ahead of time if possible.   Please call about a colonoscopy and mammogram.  Let me know if you can't get scheduled.   If you have any low sugars then cut back by 1 unit per day if needed.

## 2023-02-13 NOTE — Progress Notes (Unsigned)
Diabetes:  Using medications without difficulties: yes Hypoglycemic episodes: see below.   Hyperglycemic episodes: no Feet problems: some tingling, at baseline.   Blood Sugars averaging: 80-90s usually in the AM. Rarely down to 60s, cautions d/w pt.   eye exam within last year:yes A1c improved to 7.3.   Her dog died a few weeks ago.  Condolences offered.   She is playing piano and that is a good change for her.   Compression stockings help with edema, d/w pt.    Mammogram and colonoscopy d/w pt.    Meds, vitals, and allergies reviewed.  ROS: Per HPI unless specifically indicated in ROS section   GEN: nad, alert and oriented HEENT: ncat NECK: supple w/o LA CV: rrr. PULM: ctab, no inc wob ABD: soft, +bs EXT: no edema SKIN: no acute rash Head tremor at baseline    Diabetic foot exam: Normal inspection No skin breakdown No calluses  Normal DP pulses Dec but not absent sensation to light touch and monofilament Nails normal

## 2023-02-15 NOTE — Assessment & Plan Note (Signed)
A1c improved to 7.3.  Recheck at a yearly visit in about 4 months.  Labs ahead of time if possible.   If any low sugars then cut back by insulin 1 unit per day if needed.  Continue work on diet and exercise.  See after visit summary.  Footcare discussed with patient.

## 2023-03-01 ENCOUNTER — Other Ambulatory Visit: Payer: Self-pay | Admitting: Family Medicine

## 2023-04-13 ENCOUNTER — Other Ambulatory Visit: Payer: Self-pay | Admitting: Family Medicine

## 2023-05-03 ENCOUNTER — Other Ambulatory Visit: Payer: Self-pay | Admitting: Family Medicine

## 2023-05-15 ENCOUNTER — Other Ambulatory Visit: Payer: Self-pay | Admitting: Family Medicine

## 2023-05-16 ENCOUNTER — Telehealth: Payer: Self-pay

## 2023-05-16 NOTE — Telephone Encounter (Signed)
Reached out to patient to advise that she is due for a mammogram. Patient states that she will reach out to the breast center to schedule

## 2023-06-07 ENCOUNTER — Ambulatory Visit (INDEPENDENT_AMBULATORY_CARE_PROVIDER_SITE_OTHER): Payer: Medicare HMO | Admitting: *Deleted

## 2023-06-07 DIAGNOSIS — Z780 Asymptomatic menopausal state: Secondary | ICD-10-CM

## 2023-06-07 DIAGNOSIS — Z0000 Encounter for general adult medical examination without abnormal findings: Secondary | ICD-10-CM | POA: Diagnosis not present

## 2023-06-07 NOTE — Progress Notes (Signed)
Subjective:   Alexis [name] is a 73 y.o. female who presents for Medicare Annual (Subsequent) preventive examination.  Visit Complete: Virtual I connected with  Alexis [name] on 06/07/23 by a audio enabled telemedicine application and verified that I am speaking with the correct person using two identifiers.  Patient Location: Home  Provider Location: Home Office  I discussed the limitations of evaluation and management by telemedicine. The patient expressed understanding and agreed to proceed.  Vital Signs: Because this visit was a virtual/telehealth visit, some criteria may be missing or patient reported. Any vitals not documented were not able to be obtained and vitals that have been documented are patient reported.  Cardiac Risk Factors include: advanced age (>61men, >51 women);diabetes mellitus;hypertension     Objective:    There were no vitals filed for this visit. There is no height or weight on file to calculate BMI.     06/07/2023    3:22 PM 05/12/2022   12:41 PM 05/11/2021   12:01 PM 08/22/2019    4:05 PM 02/21/2019    2:03 PM 02/15/2018    3:10 PM 01/24/2017   12:13 PM  Advanced Directives  Does Patient Have a Medical Advance Directive? No No No No No No No  Would patient like information on creating a medical advance directive? No - Patient declined No - Patient declined Yes (MAU/Ambulatory/Procedural Areas - Information given) No - Patient declined No - Patient declined Yes (MAU/Ambulatory/Procedural Areas - Information given)     Current Medications (verified) Outpatient Encounter Medications as of 06/07/2023  Medication Sig   Alcohol Swabs (B-D SINGLE USE SWABS REGULAR) PADS Use as instructed to check blood sugar twice daily.  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   aspirin EC 81 MG tablet Take 81 mg by mouth daily.   Blood Glucose Calibration (TRUE METRIX LEVEL 2) Normal SOLN Use as instructed to check blood sugar meter.  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   cyanocobalamin (VITAMIN B12) 1000 MCG tablet Take 1 tablet (1,000 mcg total) by mouth daily.   DROPLET INSULIN SYRINGE 30G X 5/16" 0.5 ML MISC USE TO INJECT INSULIN TWICE DAILY AS DIRECTED   enalapril (VASOTEC) 10 MG tablet TAKE 1 TABLET EVERY DAY   ezetimibe (ZETIA) 10 MG tablet TAKE 1 TABLET EVERY DAY   gabapentin (NEURONTIN) 400 MG capsule TAKE 1 CAPSULE AT BEDTIME   glucose blood (TRUE METRIX BLOOD GLUCOSE TEST) test strip Use as instructed to check blood sugar twice daily.  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   Insulin Pen Needle (NOVOFINE PEN NEEDLE) 32G X 6 MM MISC Use as directed to inject Tresiba   loratadine (CLARITIN) 10 MG tablet Take 1 tablet (10 mg total) by mouth daily.   metFORMIN (GLUCOPHAGE) 1000 MG tablet TAKE 1 TABLET TWICE DAILY WITH MEALS   nystatin (MYCOSTATIN/NYSTOP) powder Apply 1 application topically 3 (three) times daily.   omeprazole (PRILOSEC) 20 MG capsule Take 20 mg by mouth daily.   ondansetron (ZOFRAN-ODT) 4 MG disintegrating tablet Take one tablet po q 12 prn nausea.   Probiotic Product (ALIGN) 4 MG CAPS Take 1 capsule by mouth daily.   propranolol (INDERAL) 20 MG tablet TAKE 1 TABLET TWICE DAILY   rosuvastatin (CRESTOR) 10 MG tablet TAKE 1/2 TABLET EVERY DAY   TRESIBA FLEXTOUCH 100 UNIT/ML FlexTouch Pen INJECT 55  TO 60 UNITS SUBCUTANEOUSLY DAILY   TRUEplus Lancets 33G MISC Use as instructed to check blood sugar twice daily.  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   ipratropium (ATROVENT) 0.06 % nasal spray Place 2 sprays into both nostrils 4 (four) times daily. (Patient not taking: Reported on 06/07/2023)   No facility-administered encounter medications on file as of 06/07/2023.    Allergies (verified) Milk-related compounds, Peanut-containing drug products, and Statins   History: Past Medical History:  Diagnosis  Date   Allergy    Asthma    in childhood   Benign tumor of pelvic bones, sacrum, and coccyx    Blood in stool    Chest pain    a. 07/2017 MV: EF 63%, no ischemia/infarct. GI update artifact.   Diabetes mellitus    dx'd at age ~38   GERD (gastroesophageal reflux disease)    Hx of colonic polyps    Hyperlipidemia    Hypertension    dx'd at age ~7   Morbid obesity (HCC)    Sleep apnea    on CPAP   Tremor    only in face; worse under periods of stress. father with tremor also   Past Surgical History:  Procedure Laterality Date   ABDOMINAL HYSTERECTOMY  1988   Surgery included removal of benign pelvic tumor   COLONOSCOPY  01/20/2017   LAPAROSCOPIC APPENDECTOMY N/A 10/30/2015   Procedure: APPENDECTOMY LAPAROSCOPIC, lysis of adhesions;  Surgeon: Gladis Riffle, MD;  Location: [facility] ORS;  Service: General;  Laterality: N/A;   OVARY BIOPSY  1988   pelvic bone tumor  1988   TONSILLECTOMY AND ADENOIDECTOMY  1957   UPPER GI ENDOSCOPY  01/20/2017   Family History  Problem Relation Age of Onset   Heart disease Mother        h/o valve replacement, no h/o MI   Colon cancer Father    Colon cancer Sister        metastatic (liver?) cancer   Uterine cancer Sister    Lung cancer Maternal Grandfather    Breast cancer Neg Hx    Social History   Socioeconomic History   Marital status: Widowed    Spouse name: Not on file   Number of children: Not on file   Years of education: Not on file   Highest education level: Not on file  Occupational History   Occupation: Payroll Group Lead    Employer: [employer]  Tobacco Use   Smoking status: Never   Smokeless tobacco: Never  Vaping Use   Vaping status: Never Used  Substance and Sexual Activity   Alcohol use: No   Drug use: No   Sexual activity: Not Currently  Other Topics Concern   Not on file  Social History Narrative   Widowed 2022, was married 1970 to [employer]      Education: 12th grade   Retired from [employer].   Enjoys  playing piano (plays for church), time at church, time with grandkids   2 kids   Social Drivers of Health   Financial Resource Strain: Low Risk  (06/07/2023)   Overall Financial Resource Strain (CARDIA)    Difficulty of Paying Living Expenses: Not hard at all  Food Insecurity: No Food Insecurity (06/07/2023)   Hunger Vital Sign    Worried About Running Out of Food in the Last Year: Never true    Ran Out of Food in the Last Year: Never true  Transportation Needs: No Transportation Needs (06/07/2023)   PRAPARE -  Administrator, Civil Service (Medical): No    Lack of Transportation (Non-Medical): No  Physical Activity: Inactive (06/07/2023)   Exercise Vital Sign    Days of Exercise per Week: 0 days    Minutes of Exercise per Session: 0 min  Stress: No Stress Concern Present (06/07/2023)   [employer] of Occupational Health - Occupational Stress Questionnaire    Feeling of Stress : Only a little  Social Connections: Moderately Integrated (06/07/2023)   Social Connection and Isolation Panel [NHANES]    Frequency of Communication with Friends and Family: More than three times a week    Frequency of Social Gatherings with Friends and Family: More than three times a week    Attends Religious Services: More than 4 times per year    Active Member of [employer] or Organizations: Yes    Attends Banker Meetings: Never    Marital Status: Widowed    Tobacco Counseling Counseling given: Not Answered   Clinical Intake:  Pre-visit preparation completed: Yes  Pain : No/denies pain     Diabetes: Yes CBG done?: No Did pt. bring in CBG monitor from home?: No  How often do you need to have someone help you when you read instructions, pamphlets, or other written materials from your doctor or pharmacy?: 1 - Never  Interpreter Needed?: No  Information entered by :: Remi Haggard LPN   Activities of Daily Living    06/07/2023    3:15 PM  In your present state of  health, do you have any difficulty performing the following activities:  Hearing? 0  Vision? 0  Difficulty concentrating or making decisions? 0  Walking or climbing stairs? 0  Dressing or bathing? 0  Doing errands, shopping? 0  Preparing Food and eating ? N  Using the Toilet? N  In the past six months, have you accidently leaked urine? N  Do you have problems with loss of bowel control? N  Managing your Medications? N  Managing your Finances? N  Housekeeping or managing your Housekeeping? N    Patient Care Team: Joaquim Nam, MD as PCP - General (Family Medicine) Mariah Milling Tollie Pizza, MD as PCP - Cardiology (Cardiology) Vilinda Flake, [facility] (Inactive) as Pharmacist (Pharmacist) [place] Crane, MD as Consulting Physician (Ophthalmology)  Indicate any recent Medical Services you may have received from other than [facility] providers in the past year (date may be approximate).     Assessment:   This is a routine wellness examination for Jezlyn.  Hearing/Vision screen Hearing Screening - Comments:: No trouble hearing Vision Screening - Comments:: Up to date Cataract both eyes   will be scheduling surgery [facility]   Goals Addressed             This Visit's Progress    Increase physical activity   Not on track    Starting 02/15/2018, I will attempt to walk at 15-30 min 5 days per week.      Increase physical activity   Not on track    Start exercise program at the gym     Patient Stated   On track    02/21/2019, I will try to lose some weight and do more walking daily.     Weight (lb) < 200 lb (90.7 kg)         Depression Screen    06/07/2023    3:20 PM 02/13/2023    8:59 AM 10/14/2022    8:59 AM 09/08/2022   10:24  AM 07/14/2022    9:08 AM 05/12/2022   12:46 PM 05/11/2021   12:05 PM  PHQ 2/9 Scores  PHQ - 2 Score 0 0 0 0 0 0 1  PHQ- 9 Score 4 2 1 2 1       Fall Risk    06/07/2023    3:11 PM 02/13/2023    8:59 AM 10/14/2022    8:59 AM 09/08/2022   10:23 AM 07/14/2022     9:07 AM  Fall Risk   Falls in the past year? 0 0 0 0 0  Number falls in past yr: 0 0 0 0 0  Injury with Fall? 0 0 0 0 0  Risk for fall due to :  No Fall Risks No Fall Risks No Fall Risks No Fall Risks  Follow up Education provided;Falls evaluation completed;Falls prevention discussed Falls evaluation completed Falls evaluation completed Falls evaluation completed Falls evaluation completed    MEDICARE RISK AT HOME: Medicare Risk at Home Any stairs in or around the home?: No If so, are there any without handrails?: No Home free of loose throw rugs in walkways, pet beds, electrical cords, etc?: Yes Adequate lighting in your home to reduce risk of falls?: Yes Life alert?: No Use of a cane, walker or w/c?: No Grab bars in the bathroom?: Yes Shower chair or bench in shower?: No Elevated toilet seat or a handicapped toilet?: Yes  TIMED UP AND GO:  Was the test performed?  No    Cognitive Function:    02/21/2019    2:07 PM 02/15/2018    3:10 PM 01/24/2017   12:13 PM  MMSE - Mini Mental State Exam  Orientation to time 5 5 5   Orientation to Place 5 5 5   Registration 3 3 3   Attention/ Calculation 5 0 0  Recall 3 3 3   Language- name 2 objects  0 0  Language- repeat 1 1 1   Language- follow 3 step command  3 3  Language- read & follow direction  0 0  Write a sentence  0 0  Copy design  0 0  Total score  20 20        06/07/2023    3:17 PM 05/12/2022   12:55 PM  6CIT Screen  What Year? 0 points 0 points  What month? 0 points 0 points  What time? 0 points 0 points  Count back from 20 0 points 0 points  Months in reverse 0 points 0 points  Repeat phrase 0 points 0 points  Total Score 0 points 0 points    Immunizations Immunization History  Administered Date(s) Administered   Fluad Quad(high Dose 65+) 02/21/2019, 02/19/2020, 06/01/2021, 03/14/2022   Fluad Trivalent(High Dose 65+) 02/13/2023   Influenza Split 02/15/2011   Influenza Whole 02/18/2013   Influenza,inj,Quad  PF,6+ Mos 05/29/2015, 02/02/2016, 01/27/2017, 02/15/2018   Moderna Sars-Covid-2 Vaccination 05/31/2019, 06/28/2019, 03/06/2020   Pneumococcal Conjugate-13 11/13/2013   Pneumococcal Polysaccharide-23 04/18/2008, 08/31/2015   Tdap 08/22/2019    TDAP status: Up to date  Flu Vaccine status: Up to date  Pneumococcal vaccine status: Up to date  Covid-19 vaccine status: Information provided on how to obtain vaccines.   Qualifies for Shingles Vaccine? No   Zostavax completed Yes   Shingrix Completed?: Yes  Screening Tests Health Maintenance  Topic Date Due   Zoster Vaccines- Shingrix (1 of 2) Never done   Colonoscopy  01/20/2022   MAMMOGRAM  04/17/2024 (Originally 01/07/2023)   Diabetic kidney evaluation - eGFR measurement  07/07/2023   Diabetic kidney evaluation - Urine ACR  07/07/2023   HEMOGLOBIN A1C  08/14/2023   OPHTHALMOLOGY EXAM  12/27/2023   FOOT EXAM  02/13/2024   Medicare Annual Wellness (AWV)  06/06/2024   DTaP/Tdap/Td (2 - Td or Tdap) 08/21/2029   Pneumonia Vaccine 31+ Years old  Completed   INFLUENZA VACCINE  Completed   DEXA SCAN  Completed   Hepatitis C Screening  Completed   HPV VACCINES  Aged Out   COVID-19 Vaccine  Discontinued    Health Maintenance  Health Maintenance Due  Topic Date Due   Zoster Vaccines- Shingrix (1 of 2) Never done   Colonoscopy  01/20/2022    Colorectal cancer screening: No longer required.   Mammogram status: Completed  . Repeat every year  Bone Density status: Ordered  . Pt provided with contact info and advised to call to schedule appt.  Lung Cancer Screening: (Low Dose CT Chest recommended if Age 70-80 years, 20 pack-year currently smoking OR have quit w/in 15years.) does not qualify.   Lung Cancer Screening Referral:   Additional Screening:  Hepatitis C Screening: does not qualify; Completed 2017  Vision Screening: Recommended annual ophthalmology exams for early detection of glaucoma and other disorders of the eye. Is  the patient up to date with their annual eye exam?  Yes  Who is the provider or what is the name of the office in which the patient attends annual eye exams? [facility] If pt is not established with a provider, would they like to be referred to a provider to establish care? No .   Dental Screening: Recommended annual dental exams for proper oral hygiene  Nutrition Risk Assessment:  Has the patient had any N/V/D within the last 2 months?  No  Does the patient have any non-healing wounds?  No  Has the patient had any unintentional weight loss or weight gain?  No   Diabetes:  Is the patient diabetic?  Yes  If diabetic, was a CBG obtained today?  No  Did the patient bring in their glucometer from home?  No  How often do you monitor your CBG's? 1 x a day.   Financial Strains and Diabetes Management:  Are you having any financial strains with the device, your supplies or your medication? No .  Does the patient want to be seen by Chronic Care Management for management of their diabetes?  No  Would the patient like to be referred to a Nutritionist or for Diabetic Management?  No   Diabetic Exams:  Diabetic Eye Exam: Completed . Advised pt to expect a call from office referred to regarding appt.  Diabetic Foot Exam: . Pt has been advised about the importance in completing this exam.     Community Resource Referral / Chronic Care Management: CRR required this visit?  No   CCM required this visit?  No     Plan:     I have personally reviewed and noted the following in the patient's chart:   Medical and social history Use of alcohol, tobacco or illicit drugs  Current medications and supplements including opioid prescriptions. Patient is not currently taking opioid prescriptions. Functional ability and status Nutritional status Physical activity Advanced directives List of other physicians Hospitalizations, surgeries, and ER visits in previous 12 months Vitals Screenings to include  cognitive, depression, and falls Referrals and appointments  In addition, I have reviewed and discussed with patient certain preventive protocols, quality metrics, and best practice recommendations. A written personalized  care plan for preventive services as well as general preventive health recommendations were provided to patient.     Remi Haggard, LPN   [id number]   After Visit Summary: (MyChart) Due to this being a telephonic visit, the after visit summary with patients personalized plan was offered to patient via MyChart   Nurse Notes:

## 2023-06-07 NOTE — Patient Instructions (Signed)
Alexis [name] , Thank you for taking time to come for your Medicare Wellness Visit. I appreciate your ongoing commitment to your health goals. Please review the following plan we discussed and let me know if I can assist you in the future.   Screening recommendations/referrals: Colonoscopy: no longer required Mammogram: Education provided Bone Density: ordered Recommended yearly ophthalmology/optometry visit for glaucoma screening and checkup Recommended yearly dental visit for hygiene and checkup  Vaccinations: Influenza vaccine: up to date Pneumococcal vaccine: up to date Tdap vaccine: up to date      Preventive Care 65 Years and Older, Female Preventive care refers to lifestyle choices and visits with your health care provider that can promote health and wellness. What does preventive care include? A yearly physical exam. This is also called an annual well check. Dental exams once or twice a year. Routine eye exams. Ask your health care provider how often you should have your eyes checked. Personal lifestyle choices, including: Daily care of your teeth and gums. Regular physical activity. Eating a healthy diet. Avoiding tobacco and drug use. Limiting alcohol use. Practicing safe sex. Taking low-dose aspirin every day. Taking vitamin and mineral supplements as recommended by your health care provider. What happens during an annual well check? The services and screenings done by your health care provider during your annual well check will depend on your age, overall health, lifestyle risk factors, and family history of disease. Counseling  Your health care provider may ask you questions about your: Alcohol use. Tobacco use. Drug use. Emotional well-being. Home and relationship well-being. Sexual activity. Eating habits. History of falls. Memory and ability to understand (cognition). Work and work Astronomer. Reproductive health. Screening  You may have the following  tests or measurements: Height, weight, and BMI. Blood pressure. Lipid and cholesterol levels. These may be checked every 5 years, or more frequently if you are over 18 years old. Skin check. Lung cancer screening. You may have this screening every year starting at age 39 if you have a 30-pack-year history of smoking and currently smoke or have quit within the past 15 years. Fecal occult blood test (FOBT) of the stool. You may have this test every year starting at age 55. Flexible sigmoidoscopy or colonoscopy. You may have a sigmoidoscopy every 5 years or a colonoscopy every 10 years starting at age 77. Hepatitis C blood test. Hepatitis B blood test. Sexually transmitted disease (STD) testing. Diabetes screening. This is done by checking your blood sugar (glucose) after you have not eaten for a while (fasting). You may have this done every 1-3 years. Bone density scan. This is done to screen for osteoporosis. You may have this done starting at age 62. Mammogram. This may be done every 1-2 years. Talk to your health care provider about how often you should have regular mammograms. Talk with your health care provider about your test results, treatment options, and if necessary, the need for more tests. Vaccines  Your health care provider may recommend certain vaccines, such as: Influenza vaccine. This is recommended every year. Tetanus, diphtheria, and acellular pertussis (Tdap, Td) vaccine. You may need a Td booster every 10 years. Zoster vaccine. You may need this after age 46. Pneumococcal 13-valent conjugate (PCV13) vaccine. One dose is recommended after age 64. Pneumococcal polysaccharide (PPSV23) vaccine. One dose is recommended after age 78. Talk to your health care provider about which screenings and vaccines you need and how often you need them. This information is not intended to replace advice given  to you by your health care provider. Make sure you discuss any questions you have with  your health care provider. Document Released: 05/01/2015 Document Revised: 12/23/2015 Document Reviewed: 02/03/2015 Elsevier Interactive Patient Education  2017 [employer].  Fall Prevention in the Home Falls can cause injuries. They can happen to people of all ages. There are many things you can do to make your home safe and to help prevent falls. What can I do on the outside of my home? Regularly fix the edges of walkways and driveways and fix any cracks. Remove anything that might make you trip as you walk through a door, such as a raised step or threshold. Trim any bushes or trees on the path to your home. Use bright outdoor lighting. Clear any walking paths of anything that might make someone trip, such as rocks or tools. Regularly check to see if handrails are loose or broken. Make sure that both sides of any steps have handrails. Any raised decks and porches should have guardrails on the edges. Have any leaves, snow, or ice cleared regularly. Use sand or salt on walking paths during winter. Clean up any spills in your garage right away. This includes oil or grease spills. What can I do in the bathroom? Use night lights. Install grab bars by the toilet and in the tub and shower. Do not use towel bars as grab bars. Use non-skid mats or decals in the tub or shower. If you need to sit down in the shower, use a plastic, non-slip stool. Keep the floor dry. Clean up any water that spills on the floor as soon as it happens. Remove soap buildup in the tub or shower regularly. Attach bath mats securely with double-sided non-slip rug tape. Do not have throw rugs and other things on the floor that can make you trip. What can I do in the bedroom? Use night lights. Make sure that you have a light by your bed that is easy to reach. Do not use any sheets or blankets that are too big for your bed. They should not hang down onto the floor. Have a firm chair that has side arms. You can use this  for support while you get dressed. Do not have throw rugs and other things on the floor that can make you trip. What can I do in the kitchen? Clean up any spills right away. Avoid walking on wet floors. Keep items that you use a lot in easy-to-reach places. If you need to reach something above you, use a strong step stool that has a grab bar. Keep electrical cords out of the way. Do not use floor polish or wax that makes floors slippery. If you must use wax, use non-skid floor wax. Do not have throw rugs and other things on the floor that can make you trip. What can I do with my stairs? Do not leave any items on the stairs. Make sure that there are handrails on both sides of the stairs and use them. Fix handrails that are broken or loose. Make sure that handrails are as long as the stairways. Check any carpeting to make sure that it is firmly attached to the stairs. Fix any carpet that is loose or worn. Avoid having throw rugs at the top or bottom of the stairs. If you do have throw rugs, attach them to the floor with carpet tape. Make sure that you have a light switch at the top of the stairs and the bottom of the  stairs. If you do not have them, ask someone to add them for you. What else can I do to help prevent falls? Wear shoes that: Do not have high heels. Have rubber bottoms. Are comfortable and fit you well. Are closed at the toe. Do not wear sandals. If you use a stepladder: Make sure that it is fully opened. Do not climb a closed stepladder. Make sure that both sides of the stepladder are locked into place. Ask someone to hold it for you, if possible. Clearly mark and make sure that you can see: Any grab bars or handrails. First and last steps. Where the edge of each step is. Use tools that help you move around (mobility aids) if they are needed. These include: Canes. Walkers. Scooters. Crutches. Turn on the lights when you go into a dark area. Replace any light bulbs as  soon as they burn out. Set up your furniture so you have a clear path. Avoid moving your furniture around. If any of your floors are uneven, fix them. If there are any pets around you, be aware of where they are. Review your medicines with your doctor. Some medicines can make you feel dizzy. This can increase your chance of falling. Ask your doctor what other things that you can do to help prevent falls. This information is not intended to replace advice given to you by your health care provider. Make sure you discuss any questions you have with your health care provider. Document Released: 01/29/2009 Document Revised: 09/10/2015 Document Reviewed: 05/09/2014 Elsevier Interactive Patient Education  2017 [employer].

## 2023-06-09 ENCOUNTER — Other Ambulatory Visit: Payer: Self-pay | Admitting: Family Medicine

## 2023-06-09 ENCOUNTER — Other Ambulatory Visit: Payer: Medicare HMO

## 2023-06-09 DIAGNOSIS — E1140 Type 2 diabetes mellitus with diabetic neuropathy, unspecified: Secondary | ICD-10-CM

## 2023-06-09 DIAGNOSIS — Z1321 Encounter for screening for nutritional disorder: Secondary | ICD-10-CM

## 2023-06-09 DIAGNOSIS — E538 Deficiency of other specified B group vitamins: Secondary | ICD-10-CM

## 2023-06-12 ENCOUNTER — Other Ambulatory Visit (INDEPENDENT_AMBULATORY_CARE_PROVIDER_SITE_OTHER): Payer: Medicare HMO

## 2023-06-12 DIAGNOSIS — Z794 Long term (current) use of insulin: Secondary | ICD-10-CM

## 2023-06-12 DIAGNOSIS — E538 Deficiency of other specified B group vitamins: Secondary | ICD-10-CM

## 2023-06-12 DIAGNOSIS — Z1321 Encounter for screening for nutritional disorder: Secondary | ICD-10-CM | POA: Diagnosis not present

## 2023-06-12 DIAGNOSIS — E1140 Type 2 diabetes mellitus with diabetic neuropathy, unspecified: Secondary | ICD-10-CM | POA: Diagnosis not present

## 2023-06-12 LAB — HEMOGLOBIN A1C: Hgb A1c MFr Bld: 7.7 % — ABNORMAL HIGH (ref 4.6–6.5)

## 2023-06-12 LAB — COMPREHENSIVE METABOLIC PANEL
ALT: 9 U/L (ref 0–35)
AST: 15 U/L (ref 0–37)
Albumin: 3.9 g/dL (ref 3.5–5.2)
Alkaline Phosphatase: 83 U/L (ref 39–117)
BUN: 14 mg/dL (ref 6–23)
CO2: 33 meq/L — ABNORMAL HIGH (ref 19–32)
Calcium: 9.4 mg/dL (ref 8.4–10.5)
Chloride: 101 meq/L (ref 96–112)
Creatinine, Ser: 0.69 mg/dL (ref 0.40–1.20)
GFR: 86.35 mL/min (ref >60.00)
Glucose, Bld: 67 mg/dL — ABNORMAL LOW (ref 70–99)
Potassium: 4.4 meq/L (ref 3.5–5.1)
Sodium: 141 meq/L (ref 135–145)
Total Bilirubin: 0.5 mg/dL (ref 0.2–1.2)
Total Protein: 7.8 g/dL (ref 6.0–8.3)

## 2023-06-12 LAB — LIPID PANEL
Cholesterol: 86 mg/dL (ref 0–200)
HDL: 29.7 mg/dL — ABNORMAL LOW (ref >39.00)
LDL Cholesterol: 35 mg/dL (ref 0–99)
NonHDL: 56.16
Total CHOL/HDL Ratio: 3
Triglycerides: 106 mg/dL (ref 0.0–149.0)
VLDL: 21.2 mg/dL (ref 0.0–40.0)

## 2023-06-12 LAB — CBC WITH DIFFERENTIAL/PLATELET
Basophils Absolute: 0.1 10*3/uL (ref 0.0–0.1)
Basophils Relative: 0.7 % (ref 0.0–3.0)
Eosinophils Absolute: 0.3 10*3/uL (ref 0.0–0.7)
Eosinophils Relative: 4.1 % (ref 0.0–5.0)
HCT: 44.3 % (ref 36.0–46.0)
Hemoglobin: 14.3 g/dL (ref 12.0–15.0)
Lymphocytes Relative: 27.5 % (ref 12.0–46.0)
Lymphs Abs: 2.1 10*3/uL (ref 0.7–4.0)
MCHC: 32.3 g/dL (ref 30.0–36.0)
MCV: 86.8 fL (ref 78.0–100.0)
Monocytes Absolute: 0.7 10*3/uL (ref 0.1–1.0)
Monocytes Relative: 8.8 % (ref 3.0–12.0)
Neutro Abs: 4.5 10*3/uL (ref 1.4–7.7)
Neutrophils Relative %: 58.9 % (ref 43.0–77.0)
Platelets: 290 10*3/uL (ref 150.0–400.0)
RBC: 5.1 Mil/uL (ref 3.87–5.11)
RDW: 14.2 % (ref 11.5–15.5)
WBC: 7.7 10*3/uL (ref 4.0–10.5)

## 2023-06-12 LAB — MICROALBUMIN / CREATININE URINE RATIO
Creatinine,U: 123.2 mg/dL
Microalb Creat Ratio: 34.9 mg/g — ABNORMAL HIGH (ref 0.0–30.0)
Microalb, Ur: 4.3 mg/dL — ABNORMAL HIGH (ref 0.0–1.9)

## 2023-06-12 LAB — VITAMIN D 25 HYDROXY (VIT D DEFICIENCY, FRACTURES): VITD: 36.14 ng/mL (ref 30.00–100.00)

## 2023-06-12 LAB — VITAMIN B12: Vitamin B-12: 912 pg/mL — ABNORMAL HIGH (ref 211–911)

## 2023-06-16 ENCOUNTER — Encounter: Payer: Medicare HMO | Admitting: Family Medicine

## 2023-06-20 ENCOUNTER — Ambulatory Visit: Payer: Medicare HMO | Admitting: Family Medicine

## 2023-06-20 ENCOUNTER — Encounter: Payer: Self-pay | Admitting: Family Medicine

## 2023-06-20 VITALS — BP 134/68 | HR 71 | Temp 98.5°F | Ht 62.0 in | Wt 201.6 lb

## 2023-06-20 DIAGNOSIS — E538 Deficiency of other specified B group vitamins: Secondary | ICD-10-CM

## 2023-06-20 DIAGNOSIS — I10 Essential (primary) hypertension: Secondary | ICD-10-CM

## 2023-06-20 DIAGNOSIS — E1140 Type 2 diabetes mellitus with diabetic neuropathy, unspecified: Secondary | ICD-10-CM

## 2023-06-20 DIAGNOSIS — Q7959 Other congenital malformations of abdominal wall: Secondary | ICD-10-CM

## 2023-06-20 DIAGNOSIS — R251 Tremor, unspecified: Secondary | ICD-10-CM | POA: Diagnosis not present

## 2023-06-20 DIAGNOSIS — E7800 Pure hypercholesterolemia, unspecified: Secondary | ICD-10-CM

## 2023-06-20 DIAGNOSIS — Z0000 Encounter for general adult medical examination without abnormal findings: Secondary | ICD-10-CM

## 2023-06-20 DIAGNOSIS — Z7189 Other specified counseling: Secondary | ICD-10-CM

## 2023-06-20 DIAGNOSIS — Z794 Long term (current) use of insulin: Secondary | ICD-10-CM | POA: Diagnosis not present

## 2023-06-20 NOTE — Progress Notes (Unsigned)
 Diabetes:  Using medications without difficulties: yes Hypoglycemic episodes: rarely down to 60s, cautions d/w pt.  She was fasting on labs here.  Hyperglycemic episodes: no Feet problems: mild foot tingling at baseline.  Blood Sugars averaging: usually ~ 70s in the AM.  Likely higher later in the day.  eye exam within last year: f/u pending.  55 units tresiba.  Metformin BID.    Longstanding prominence on the L side of abd, not variable from day to day, noted after weight loss.  H/o abd surgeries.  No abd pain.  H/o intentional weight loss. No black or bloody stools.   Hypertension:    Using medication without problems or lightheadedness: yes Chest pain with exertion:no Edema:some occ after inc in salt, d/w pt.   Short of breath:no  Elevated Cholesterol: Using medications without problems: yes Muscle aches: no Diet compliance: d/w pt.  Exercise: d/w pt.    B12 def. On oral replacement. D/w pt about continued use.    Managing tremor with beta blocker.  Not worse per patient report.    Flu vaccine 2023 Shingles d/w pt.   Tetanus 2021 PNA up to date.  Covid prev done.   Colonoscopy done 2018.   d/w pt about f/u 2025 and contact info given to patient re: GI.  EGD 2018.   Mammogram pending 2025 DXA prev ordered 2023.   Pap not due.   Advance care planning- daughter Aura Dials designated if patient were incapacitated.    He dog died, condolences offered.    PMH and SH reviewed.   Vital signs, Meds and allergies reviewed.  ROS: Per HPI unless specifically indicated in ROS section   GEN: nad, alert and oriented HEENT: ncat NECK: supple w/o LA CV: rrr PULM: ctab, no inc wob ABD: soft, +bs, she has what appears to be benign abd wall changes noted on the L side of the abd w/o tenderness.   EXT: trace BLE edema SKIN: no acute rash Mild head tremor.    Diabetic foot exam: Normal inspection No skin breakdown No calluses  Normal DP pulses Normal sensation to light  tough and monofilament Nails normal

## 2023-06-20 NOTE — Patient Instructions (Addendum)
 Please ask the imaging site about getting a bone density test done and let me know if you need help getting scheduled.  Please call the GI clinic.  Take care.  Glad to see you. Recheck in about 4 months.  A1c at the visit.  If you have any trouble with lower sugars, then cut back by 1 unit per day.

## 2023-06-21 DIAGNOSIS — Q7959 Other congenital malformations of abdominal wall: Secondary | ICD-10-CM | POA: Insufficient documentation

## 2023-06-21 NOTE — Assessment & Plan Note (Signed)
 55 units tresiba.  Metformin BID.   Recheck in about 4 months.  A1c at the visit.  If any lower sugars, then cut back by 1 unit per day.

## 2023-06-21 NOTE — Assessment & Plan Note (Signed)
 Flu vaccine 2023 Shingles d/w pt.   Tetanus 2021 PNA up to date.  Covid prev done.   Colonoscopy done 2018.   d/w pt about 2025. EGD 2018.   Mammogram pending 2025 DXA prev ordered 2023.   Pap not due.   Advance care planning- daughter Aura Dials designated if patient were incapacitated.

## 2023-06-21 NOTE — Assessment & Plan Note (Signed)
 Continue crestor and zetia.  Labs d/w pt.

## 2023-06-21 NOTE — Assessment & Plan Note (Signed)
Advance care planning- daughter Vilinda Boehringer designated if patient were incapacitated.

## 2023-06-21 NOTE — Assessment & Plan Note (Signed)
 B12 def. On oral replacement. D/w pt about continued use.

## 2023-06-21 NOTE — Assessment & Plan Note (Signed)
 It looks like she has benign abd wall changes after intentional weight loss. D/w pt.

## 2023-06-21 NOTE — Assessment & Plan Note (Signed)
 Continue propranolol, enalapril, labs d/w pt.

## 2023-06-21 NOTE — Assessment & Plan Note (Signed)
 Managing tremor with beta blocker.  Not worse per patient report.   Continue propranolol as is.

## 2023-06-22 DIAGNOSIS — Z1231 Encounter for screening mammogram for malignant neoplasm of breast: Secondary | ICD-10-CM | POA: Diagnosis not present

## 2023-06-22 LAB — HM MAMMOGRAPHY

## 2023-06-23 ENCOUNTER — Encounter: Payer: Self-pay | Admitting: Family Medicine

## 2023-06-25 ENCOUNTER — Telehealth: Payer: Self-pay | Admitting: Family Medicine

## 2023-06-25 NOTE — Telephone Encounter (Signed)
 Please update patient- they will get a letter/mychart message about this.    The hospital system discovered a software error in a system at the laboratory at [facility] at [address] in [place] that examines kidney function. The patient's results were incorrectly calculated on this test.  The test is called uACR. It measures the amount of albumin (a protein) in the urine relative to creatinine (a waste product). This test is one of several factors used to judge kidney function. The laboratory software miscalculated the ratio of one to the other. The measurements themselves were correct. The software error has been fixed.  I went back and checked the patient's lab results and medicines.    At this point, it does not appear that the patient needs to make any medicine changes.  We will still monitor her labs as we have routinely done.   If the patient has any questions or concerns about this, please schedule a visit/let me know.   Thanks.

## 2023-06-26 ENCOUNTER — Other Ambulatory Visit: Payer: Self-pay

## 2023-06-26 MED ORDER — TRESIBA FLEXTOUCH 100 UNIT/ML ~~LOC~~ SOPN: 55.0000 [IU] | PEN_INJECTOR | Freq: Every day | SUBCUTANEOUS | 5 refills | Status: DC

## 2023-06-27 ENCOUNTER — Other Ambulatory Visit: Payer: Self-pay

## 2023-06-27 DIAGNOSIS — H538 Other visual disturbances: Secondary | ICD-10-CM | POA: Diagnosis not present

## 2023-06-27 DIAGNOSIS — H04123 Dry eye syndrome of bilateral lacrimal glands: Secondary | ICD-10-CM | POA: Diagnosis not present

## 2023-06-27 LAB — HM DIABETES EYE EXAM

## 2023-06-27 MED ORDER — NOVOFINE PEN NEEDLE 32G X 6 MM MISC: 3 refills | Status: AC

## 2023-06-27 NOTE — Telephone Encounter (Signed)
 Patient is aware

## 2023-06-29 ENCOUNTER — Telehealth: Payer: Self-pay

## 2023-06-29 NOTE — Telephone Encounter (Signed)
 Copied from CRM [id number]. Topic: Clinical - Medication Question >> Jun 29, 2023  4:12 PM [name] wrote: Reason for CRM: Patient stated how [facility] dropped for 4 boxes of insulin, which is unusual as she stated she usually gets one box at a time. Patient just wants to clarify if the 4 boxes will last that long in her fridge.

## 2023-06-29 NOTE — Telephone Encounter (Signed)
 Advised patient to check expiration date on boxes. She did and boxes will be good until she uses them. Nothing further needed at this time.

## 2023-06-29 NOTE — Telephone Encounter (Signed)
 Unable to reach patient.  Mailbox is full.

## 2023-07-03 ENCOUNTER — Other Ambulatory Visit: Payer: Self-pay | Admitting: Family Medicine

## 2023-07-05 ENCOUNTER — Other Ambulatory Visit: Payer: Self-pay | Admitting: Cardiovascular Disease

## 2023-07-10 ENCOUNTER — Other Ambulatory Visit: Payer: Self-pay

## 2023-07-10 ENCOUNTER — Encounter: Payer: Self-pay | Admitting: Ophthalmology

## 2023-07-11 ENCOUNTER — Encounter: Payer: Self-pay | Admitting: Ophthalmology

## 2023-07-11 NOTE — Anesthesia Preprocedure Evaluation (Addendum)
 Anesthesia Evaluation  Patient identified by MRN, date of birth, ID band Patient awake    Reviewed: Allergy & Precautions, H&P , NPO status , Patient's Chart, lab work & pertinent test results  Airway Mallampati: IV  TM Distance: <3 FB Neck ROM: Full    Dental no notable dental hx.    Pulmonary neg pulmonary ROS, asthma , sleep apnea    Pulmonary exam normal breath sounds clear to auscultation       Cardiovascular hypertension, + CAD  Normal cardiovascular exam Rhythm:Regular Rate:Normal     Neuro/Psych  Neuromuscular disease negative neurological ROS  negative psych ROS   GI/Hepatic negative GI ROS, Neg liver ROS,GERD  ,,  Endo/Other  diabetes    Renal/GU negative Renal ROS  negative genitourinary   Musculoskeletal negative musculoskeletal ROS (+)    Abdominal   Peds negative pediatric ROS (+)  Hematology negative hematology ROS (+)   Anesthesia Other Findings   GERD (gastroesophageal reflux disease) Allergy Hyperlipidemia  Hx of colonic polyps Blood in stool  Asthma Diabetes mellitus  Hypertension Tremor  Sleep apnea, but does not use CPAP she was supposed to be using, said it made her nose bleed Benign tumor of pelvic bones, sacrum, and coccyx  Chest pain Morbid obesity (HCC)  Continuous non-intention head tremor noted--hx benign essential tremor    Reproductive/Obstetrics negative OB ROS                             Anesthesia Physical Anesthesia Plan  ASA: 3  Anesthesia Plan: MAC   Post-op Pain Management:    Induction: Intravenous  PONV Risk Score and Plan:   Airway Management Planned: Natural Airway and Nasal Cannula  Additional Equipment:   Intra-op Plan:   Post-operative Plan:   Informed Consent: I have reviewed the patients History and Physical, chart, labs and discussed the procedure including the risks, benefits and alternatives for the proposed  anesthesia with the patient or authorized representative who has indicated his/her understanding and acceptance.     Dental Advisory Given  Plan Discussed with: Anesthesiologist, CRNA and Surgeon  Anesthesia Plan Comments: (Patient consented for risks of anesthesia including but not limited to:  - adverse reactions to medications - damage to eyes, teeth, lips or other oral mucosa - nerve damage due to positioning  - sore throat or hoarseness - Damage to heart, brain, nerves, lungs, other parts of body or loss of life  Patient voiced understanding and assent.)        Anesthesia Quick Evaluation

## 2023-07-19 DIAGNOSIS — H2511 Age-related nuclear cataract, right eye: Secondary | ICD-10-CM | POA: Diagnosis not present

## 2023-07-19 DIAGNOSIS — H2513 Age-related nuclear cataract, bilateral: Secondary | ICD-10-CM | POA: Diagnosis not present

## 2023-07-20 NOTE — Discharge Instructions (Signed)

## 2023-07-24 ENCOUNTER — Encounter
Admission: RE | Disposition: A | Discharge status: Home / Self Care | Payer: Self-pay | Source: Home / Self Care | Attending: Ophthalmology

## 2023-07-24 ENCOUNTER — Encounter: Payer: Self-pay | Admitting: Ophthalmology

## 2023-07-24 ENCOUNTER — Ambulatory Visit
Admission: RE | Admit: 2023-07-24 | Discharge: 2023-07-24 | Disposition: A | Discharge status: Home / Self Care | Attending: Ophthalmology | Admitting: Ophthalmology

## 2023-07-24 ENCOUNTER — Ambulatory Visit: Payer: Self-pay | Admitting: Anesthesiology

## 2023-07-24 ENCOUNTER — Other Ambulatory Visit: Payer: Self-pay

## 2023-07-24 DIAGNOSIS — J45909 Unspecified asthma, uncomplicated: Secondary | ICD-10-CM | POA: Diagnosis not present

## 2023-07-24 DIAGNOSIS — G4730 Sleep apnea, unspecified: Secondary | ICD-10-CM | POA: Diagnosis not present

## 2023-07-24 DIAGNOSIS — Z7984 Long term (current) use of oral hypoglycemic drugs: Secondary | ICD-10-CM | POA: Insufficient documentation

## 2023-07-24 DIAGNOSIS — I2510 Atherosclerotic heart disease of native coronary artery without angina pectoris: Secondary | ICD-10-CM | POA: Diagnosis not present

## 2023-07-24 DIAGNOSIS — Z794 Long term (current) use of insulin: Secondary | ICD-10-CM | POA: Diagnosis not present

## 2023-07-24 DIAGNOSIS — H2512 Age-related nuclear cataract, left eye: Secondary | ICD-10-CM | POA: Diagnosis not present

## 2023-07-24 DIAGNOSIS — I10 Essential (primary) hypertension: Secondary | ICD-10-CM | POA: Diagnosis not present

## 2023-07-24 DIAGNOSIS — K219 Gastro-esophageal reflux disease without esophagitis: Secondary | ICD-10-CM | POA: Diagnosis not present

## 2023-07-24 DIAGNOSIS — E1136 Type 2 diabetes mellitus with diabetic cataract: Secondary | ICD-10-CM | POA: Diagnosis not present

## 2023-07-24 HISTORY — PX: CATARACT EXTRACTION W/PHACO: SHX586

## 2023-07-24 HISTORY — DX: Type 2 diabetes mellitus with diabetic neuropathy, unspecified: E11.40

## 2023-07-24 HISTORY — DX: Essential tremor: G25.0

## 2023-07-24 LAB — GLUCOSE, CAPILLARY: Glucose-Capillary: 94 mg/dL (ref 70–99)

## 2023-07-24 SURGERY — PHACOEMULSIFICATION, CATARACT, WITH IOL INSERTION
Anesthesia: Monitor Anesthesia Care | Site: Eye | Laterality: Left

## 2023-07-24 MED ORDER — ARMC OPHTHALMIC DILATING DROPS
OPHTHALMIC | Status: AC
  Filled 2023-07-24: qty 0.5

## 2023-07-24 MED ORDER — ARMC OPHTHALMIC DILATING DROPS
1.0000 | OPHTHALMIC | Status: DC | PRN
  Administered 2023-07-24 (×3): 1 via OPHTHALMIC

## 2023-07-24 MED ORDER — MIDAZOLAM HCL 2 MG/2ML IJ SOLN
INTRAMUSCULAR | Status: DC | PRN
  Administered 2023-07-24: 2 mg via INTRAVENOUS

## 2023-07-24 MED ORDER — SIGHTPATH DOSE#1 BSS IO SOLN
INTRAOCULAR | Status: DC | PRN
  Administered 2023-07-24: 15 mL via INTRAOCULAR

## 2023-07-24 MED ORDER — SIGHTPATH DOSE#1 BSS IO SOLN
INTRAOCULAR | Status: DC | PRN
  Administered 2023-07-24: 88 mL via OPHTHALMIC

## 2023-07-24 MED ORDER — TETRACAINE HCL 0.5 % OP SOLN
1.0000 [drp] | OPHTHALMIC | Status: DC | PRN
  Administered 2023-07-24 (×3): 1 [drp] via OPHTHALMIC

## 2023-07-24 MED ORDER — MOXIFLOXACIN HCL 0.5 % OP SOLN
OPHTHALMIC | Status: DC | PRN
  Administered 2023-07-24: .2 mL via OPHTHALMIC

## 2023-07-24 MED ORDER — TETRACAINE HCL 0.5 % OP SOLN
OPHTHALMIC | Status: AC
  Filled 2023-07-24: qty 4

## 2023-07-24 MED ORDER — MIDAZOLAM HCL 2 MG/2ML IJ SOLN
INTRAMUSCULAR | Status: AC
Start: 2023-07-24 — End: ?
  Filled 2023-07-24: qty 2

## 2023-07-24 MED ORDER — FENTANYL CITRATE (PF) 100 MCG/2ML IJ SOLN
INTRAMUSCULAR | Status: AC
  Filled 2023-07-24: qty 2

## 2023-07-24 MED ORDER — SIGHTPATH DOSE#1 NA HYALUR & NA CHOND-NA HYALUR IO KIT
PACK | INTRAOCULAR | Status: DC | PRN
  Administered 2023-07-24: 1 via OPHTHALMIC

## 2023-07-24 MED ORDER — LIDOCAINE HCL (PF) 2 % IJ SOLN
INTRAOCULAR | Status: DC | PRN
  Administered 2023-07-24: 4 mL via INTRAOCULAR

## 2023-07-24 MED ORDER — FENTANYL CITRATE (PF) 100 MCG/2ML IJ SOLN
INTRAMUSCULAR | Status: DC | PRN
  Administered 2023-07-24: 50 ug via INTRAVENOUS

## 2023-07-24 SURGICAL SUPPLY — 12 items
CATARACT SUITE SIGHTPATH (MISCELLANEOUS) ×1 IMPLANT
DISSECTOR HYDRO NUCLEUS 50X22 (MISCELLANEOUS) ×1 IMPLANT
FEE CATARACT SUITE SIGHTPATH (MISCELLANEOUS) ×1 IMPLANT
GLOVE PI ULTRA LF STRL 7.5 (GLOVE) ×1 IMPLANT
GLOVE SURG POLYISOPRENE 8.5 (GLOVE) ×1 IMPLANT
GLOVE SURG PROTEXIS BL SZ6.5 (GLOVE) ×1 IMPLANT
GLOVE SURG SYN 6.5 PF PI BL (GLOVE) ×1 IMPLANT
GLOVE SURG SYN 8.5 PF PI BL (GLOVE) ×1 IMPLANT
LENS IOL TECNIS EYHANCE 24.5 (Intraocular Lens) IMPLANT
NDL FILTER BLUNT 18X1 1/2 (NEEDLE) ×1 IMPLANT
NEEDLE FILTER BLUNT 18X1 1/2 (NEEDLE) ×1 IMPLANT
SYR 3ML LL SCALE MARK (SYRINGE) ×1 IMPLANT

## 2023-07-24 NOTE — Op Note (Signed)
 OPERATIVE NOTE  Alexis [name] [id number] 07/24/2023   PREOPERATIVE DIAGNOSIS:  Nuclear sclerotic cataract left eye.  H25.12   POSTOPERATIVE DIAGNOSIS:    Nuclear sclerotic cataract left eye.     PROCEDURE:  Phacoemusification with posterior chamber intraocular lens placement of the left eye   LENS:   Implant Name Type Inv. Item Serial No. Manufacturer Lot No. LRB No. Used Action  LENS IOL TECNIS EYHANCE 24.5 - [id number] Intraocular Lens LENS IOL TECNIS EYHANCE 24.5 [id number] [employer]  Left 1 Implanted      Procedure(s): PHACOEMULSIFICATION, CATARACT, WITH IOL INSERTION 6.95 00:50.0 (Left)  SURGEON:  Willey Blade, MD, MPH   ANESTHESIA:  Topical with tetracaine drops augmented with 1% preservative-free intracameral lidocaine.  ESTIMATED BLOOD LOSS: <1 mL   COMPLICATIONS:  None.   DESCRIPTION OF PROCEDURE:  The patient was identified in the holding room and transported to the operating room and placed in the supine position under the operating microscope.  The left eye was identified as the operative eye and it was prepped and draped in the usual sterile ophthalmic fashion.   A 1.0 millimeter clear-corneal paracentesis was made at the 5:00 position. 0.5 ml of preservative-free 1% lidocaine with epinephrine was injected into the anterior chamber.  The anterior chamber was filled with viscoelastic.  A 2.4 millimeter keratome was used to make a near-clear corneal incision at the 2:00 position.  A curvilinear capsulorrhexis was made with a cystotome and capsulorrhexis forceps.  Balanced salt solution was used to hydrodissect and hydrodelineate the nucleus.   Phacoemulsification was then used in stop and chop fashion to remove the lens nucleus and epinucleus.  The remaining cortex was then removed using the irrigation and aspiration handpiece. Viscoelastic was then placed into the capsular bag to distend it for lens placement.  A lens was then injected into the capsular bag.  The remaining  viscoelastic was aspirated.   Wounds were hydrated with balanced salt solution.  The anterior chamber was inflated to a physiologic pressure with balanced salt solution.  Intracameral vigamox 0.1 mL undiltued was injected into the eye and a drop placed onto the ocular surface.  No wound leaks were noted.  The patient was taken to the recovery room in stable condition without complications of anesthesia or surgery  Willey Blade 07/24/2023, 11:03 AM

## 2023-07-24 NOTE — H&P (Signed)
 [facility]   Primary Care Physician:  Joaquim Nam, MD Ophthalmologist: Dr. Willey Blade  Pre-Procedure History & Physical: HPI:  Alexis [name] is a 73 y.o. female here for cataract surgery.   Past Medical History:  Diagnosis Date   Allergy    Asthma    in childhood   Benign tumor of pelvic bones, sacrum, and coccyx    Blood in stool    Chest pain    a. 07/2017 MV: EF 63%, no ischemia/infarct. GI update artifact.   Diabetes mellitus    dx'd at age ~39   GERD (gastroesophageal reflux disease)    Hx of colonic polyps    Hyperlipidemia    Hypertension    dx'd at age ~55   Morbid obesity (HCC)    Sleep apnea    on CPAP   Tremor    only in face; worse under periods of stress. father with tremor also   Type 2 diabetes mellitus with diabetic neuropathy [facility])     Past Surgical History:  Procedure Laterality Date   ABDOMINAL HYSTERECTOMY  1988   Surgery included removal of benign pelvic tumor   COLONOSCOPY  01/20/2017   LAPAROSCOPIC APPENDECTOMY N/A 10/30/2015   Procedure: APPENDECTOMY LAPAROSCOPIC, lysis of adhesions;  Surgeon: Gladis Riffle, MD;  Location: [facility] ORS;  Service: General;  Laterality: N/A;   OVARY BIOPSY  1988   pelvic bone tumor  1988   TONSILLECTOMY AND ADENOIDECTOMY  1957   UPPER GI ENDOSCOPY  01/20/2017    Prior to Admission medications   Medication Sig Start Date End Date Taking? Authorizing Provider  aspirin EC 81 MG tablet Take 81 mg by mouth daily.   Yes [provider]  cholecalciferol (VITAMIN D3) 25 MCG (1000 UNIT) tablet Take 1,000 Units by mouth daily.   Yes [provider]  cyanocobalamin (VITAMIN B12) 1000 MCG tablet Take 1 tablet (1,000 mcg total) by mouth daily. 07/14/22  Yes Joaquim Nam, MD  enalapril (VASOTEC) 10 MG tablet TAKE 1 TABLET EVERY DAY 05/15/23  Yes Joaquim Nam, MD  ezetimibe (ZETIA) 10 MG tablet TAKE 1 TABLET EVERY DAY 07/05/23  Yes Antonieta Iba, MD  gabapentin (NEURONTIN) 400 MG  capsule TAKE 1 CAPSULE AT BEDTIME 07/22/22  Yes Joaquim Nam, MD  metFORMIN (GLUCOPHAGE) 1000 MG tablet TAKE 1 TABLET TWICE DAILY WITH MEALS (NEED MD APPOINTMENT FOR REFILLS) 07/03/23  Yes Joaquim Nam, MD  omeprazole (PRILOSEC) 20 MG capsule Take 20 mg by mouth daily.   Yes [provider]  Probiotic Product (ALIGN) 4 MG CAPS Take 1 capsule by mouth daily.   Yes [provider]  propranolol (INDERAL) 20 MG tablet TAKE 1 TABLET TWICE DAILY 05/15/23  Yes Joaquim Nam, MD  rosuvastatin (CRESTOR) 10 MG tablet TAKE 1/2 TABLET EVERY DAY 07/05/23  Yes Gollan, Tollie Pizza, MD  Alcohol Swabs (B-D SINGLE USE SWABS REGULAR) PADS Use as instructed to check blood sugar twice daily.  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 11/12/19   Joaquim Nam, MD  Blood Glucose Calibration (TRUE METRIX LEVEL 2) Normal SOLN Use as instructed to check blood sugar meter.  Diagnosis:  E11.9  Insulin dependent 11/12/19   Joaquim Nam, MD  Blood Glucose Monitoring Suppl (TRUE METRIX METER) w/Device KIT Use to check blood sugar twice daily.  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 11/05/19   Joaquim Nam, MD  DROPLET INSULIN SYRINGE 30G X 5/16" 0.5 ML MISC USE TO  INJECT INSULIN TWICE DAILY AS DIRECTED 05/21/18   Joaquim Nam, MD  glucose blood (TRUE METRIX BLOOD GLUCOSE TEST) test strip Use as instructed to check blood sugar twice daily.  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 11/05/19   Joaquim Nam, MD  Insulin Pen Needle (NOVOFINE PEN NEEDLE) 32G X 6 MM MISC Use as directed to inject Evaristo Bury 06/27/23   Joaquim Nam, MD  ipratropium (ATROVENT) 0.06 % nasal spray Place 2 sprays into both nostrils 4 (four) times daily. 12/25/21   Becky Augusta, NP  loratadine (CLARITIN) 10 MG tablet Take 1 tablet (10 mg total) by mouth daily. 10/14/16   Joaquim Nam, MD  nystatin (MYCOSTATIN/NYSTOP) powder Apply 1 application topically 3 (three) times daily. 11/05/19   Joaquim Nam, MD  ondansetron (ZOFRAN-ODT) 4  MG disintegrating tablet Take one tablet po q 12 prn nausea.    [provider]  TRESIBA FLEXTOUCH 100 UNIT/ML FlexTouch Pen Inject 55-60 Units into the skin daily. 06/26/23   Joaquim Nam, MD  TRUEplus Lancets 33G MISC Use as instructed to check blood sugar twice daily.  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 11/12/19   Joaquim Nam, MD    Allergies as of 06/29/2023 - Review Complete 06/20/2023  Allergen Reaction Noted   Milk-related compounds  06/14/2019   Peanut-containing drug products  06/14/2019   Statins Other (See Comments) 01/27/2017    Family History  Problem Relation Age of Onset   Heart disease Mother        h/o valve replacement, no h/o MI   Colon cancer Father    Colon cancer Sister        metastatic (liver?) cancer   Uterine cancer Sister    Lung cancer Maternal Grandfather    Breast cancer Neg Hx     Social History   Socioeconomic History   Marital status: Widowed    Spouse name: Not on file   Number of children: Not on file   Years of education: Not on file   Highest education level: Not on file  Occupational History   Occupation: Payroll Group Lead    Employer: [employer]  Tobacco Use   Smoking status: Never   Smokeless tobacco: Never  Vaping Use   Vaping status: Never Used  Substance and Sexual Activity   Alcohol use: No   Drug use: No   Sexual activity: Not Currently  Other Topics Concern   Not on file  Social History Narrative   Widowed 2022, was married 1970 to [employer]      Education: 12th grade   Retired from [employer].   Enjoys playing piano (plays for church), time at church, time with grandkids   2 kids   Social Drivers of Health   Financial Resource Strain: Low Risk  (06/07/2023)   Overall Financial Resource Strain (CARDIA)    Difficulty of Paying Living Expenses: Not hard at all  Food Insecurity: No Food Insecurity (06/07/2023)   Hunger Vital Sign    Worried About Running Out of Food in the Last Year: Never true     Ran Out of Food in the Last Year: Never true  Transportation Needs: No Transportation Needs (06/07/2023)   PRAPARE - Administrator, Civil Service (Medical): No    Lack of Transportation (Non-Medical): No  Physical Activity: Inactive (06/07/2023)   Exercise Vital Sign    Days of Exercise per Week: 0 days    Minutes of Exercise per Session:  0 min  Stress: No Stress Concern Present (06/07/2023)   [employer] of Occupational Health - Occupational Stress Questionnaire    Feeling of Stress : Only a little  Social Connections: Moderately Integrated (06/07/2023)   Social Connection and Isolation Panel [NHANES]    Frequency of Communication with Friends and Family: More than three times a week    Frequency of Social Gatherings with Friends and Family: More than three times a week    Attends Religious Services: More than 4 times per year    Active Member of [employer] or Organizations: Yes    Attends Banker Meetings: Never    Marital Status: Widowed  Intimate Partner Violence: Not At Risk (06/07/2023)   Humiliation, Afraid, Rape, and Kick questionnaire    Fear of Current or Ex-Partner: No    Emotionally Abused: No    Physically Abused: No    Sexually Abused: No    Review of Systems: See HPI, otherwise negative ROS  Physical Exam: BP 135/60   Temp (!) 97.5 F (36.4 C) (Temporal)   Resp 12   Ht 5' 2.99" (1.6 m)   Wt 91.4 kg   SpO2 94%   BMI 35.70 kg/m  General:   Alert, cooperative. Head:  Normocephalic and atraumatic. Respiratory:  Normal work of breathing. Cardiovascular:  NAD  Impression/Plan: DUAA STELZNER is here for cataract surgery.  Risks, benefits, limitations, and alternatives regarding cataract surgery have been reviewed with the patient.  Questions have been answered.  All parties agreeable.   Willey Blade, MD  07/24/2023, 10:38 AM

## 2023-07-24 NOTE — Anesthesia Postprocedure Evaluation (Signed)
 Anesthesia Post Note  Patient: NICHOLA CIESLINSKI  Procedure(s) Performed: PHACOEMULSIFICATION, CATARACT, WITH IOL INSERTION 6.95 00:50.0 (Left: Eye)  Patient location during evaluation: PACU Anesthesia Type: MAC Level of consciousness: awake and alert Pain management: pain level controlled Vital Signs Assessment: post-procedure vital signs reviewed and stable Respiratory status: spontaneous breathing, nonlabored ventilation, respiratory function stable and patient connected to nasal cannula oxygen Cardiovascular status: stable and blood pressure returned to baseline Postop Assessment: no apparent nausea or vomiting Anesthetic complications: no   No notable events documented.   Last Vitals:  Vitals:   07/24/23 1105 07/24/23 1110  BP: (!) 111/49 116/60  Pulse: 69 71  Resp: 14 14  Temp: (!) 36.2 C (!) 36.2 C  SpO2: 96% 97%    Last Pain:  Vitals:   07/24/23 1110  TempSrc:   PainSc: 0-No pain                 Lucresha Dismuke C Margia Wiesen

## 2023-07-24 NOTE — Transfer of Care (Signed)
 Immediate Anesthesia Transfer of Care Note  Patient: Alexis [name]  Procedure(s) Performed: PHACOEMULSIFICATION, CATARACT, WITH IOL INSERTION 6.95 00:50.0 (Left: Eye)  Patient Location: PACU  Anesthesia Type: MAC  Level of Consciousness: awake, alert  and patient cooperative  Airway and Oxygen Therapy: Patient Spontanous Breathing and Patient connected to supplemental oxygen  Post-op Assessment: Post-op Vital signs reviewed, Patient's Cardiovascular Status Stable, Respiratory Function Stable, Patent Airway and No signs of Nausea or vomiting  Post-op Vital Signs: Reviewed and stable  Complications: No notable events documented.

## 2023-07-31 ENCOUNTER — Other Ambulatory Visit: Payer: Self-pay | Admitting: Family Medicine

## 2023-07-31 ENCOUNTER — Encounter: Payer: Self-pay | Admitting: Ophthalmology

## 2023-07-31 NOTE — Anesthesia Preprocedure Evaluation (Addendum)
 Anesthesia Evaluation  Patient identified by MRN, date of birth, ID band Patient awake    Reviewed: Allergy & Precautions, H&P , NPO status , Patient's Chart, lab work & pertinent test results  Airway Mallampati: IV  TM Distance: <3 FB Neck ROM: full    Dental no notable dental hx.    Pulmonary asthma , sleep apnea    Pulmonary exam normal        Cardiovascular hypertension, + CAD (seen on ct scan)  negative cardio ROS Normal cardiovascular exam     Neuro/Psych  Neuromuscular disease  negative psych ROS   GI/Hepatic Neg liver ROS,GERD  ,,  Endo/Other  diabetes    Renal/GU      Musculoskeletal   Abdominal  (+) + obese  Peds  Hematology negative hematology ROS (+)   Anesthesia Other Findings Previous cataract surgery 07-24-23 Dr. Aldo Amble  GERD (gastroesophageal reflux disease) Allergy Hyperlipidemia  Hx of colonic polyps Blood in stool  Asthma Diabetes mellitus  Hypertension Tremor  Sleep apnea Benign tumor of pelvic bones, sacrum, and coccyx  Chest pain Morbid obesity (HCC)  Type 2 diabetes mellitus with diabetic neuropathy (HCC) Benign essential tremor  Obesity BMI 35.7   Reproductive/Obstetrics negative OB ROS                             Anesthesia Physical Anesthesia Plan  ASA: 3  Anesthesia Plan: MAC   Post-op Pain Management:    Induction: Intravenous  PONV Risk Score and Plan:   Airway Management Planned: Natural Airway and Nasal Cannula  Additional Equipment:   Intra-op Plan:   Post-operative Plan:   Informed Consent: I have reviewed the patients History and Physical, chart, labs and discussed the procedure including the risks, benefits and alternatives for the proposed anesthesia with the patient or authorized representative who has indicated his/her understanding and acceptance.     Dental Advisory Given  Plan Discussed with: Anesthesiologist, CRNA  and Surgeon  Anesthesia Plan Comments:         Anesthesia Quick Evaluation

## 2023-08-07 ENCOUNTER — Ambulatory Visit: Payer: Self-pay | Admitting: Anesthesiology

## 2023-08-07 ENCOUNTER — Encounter: Payer: Self-pay | Admitting: Ophthalmology

## 2023-08-07 ENCOUNTER — Other Ambulatory Visit: Payer: Self-pay

## 2023-08-07 ENCOUNTER — Encounter
Admission: RE | Disposition: A | Discharge status: Home / Self Care | Payer: Self-pay | Source: Home / Self Care | Attending: Ophthalmology

## 2023-08-07 ENCOUNTER — Ambulatory Visit
Admission: RE | Admit: 2023-08-07 | Discharge: 2023-08-07 | Disposition: A | Discharge status: Home / Self Care | Attending: Ophthalmology | Admitting: Ophthalmology

## 2023-08-07 DIAGNOSIS — Z6835 Body mass index (BMI) 35.0-35.9, adult: Secondary | ICD-10-CM | POA: Insufficient documentation

## 2023-08-07 DIAGNOSIS — Z7984 Long term (current) use of oral hypoglycemic drugs: Secondary | ICD-10-CM | POA: Diagnosis not present

## 2023-08-07 DIAGNOSIS — K219 Gastro-esophageal reflux disease without esophagitis: Secondary | ICD-10-CM | POA: Insufficient documentation

## 2023-08-07 DIAGNOSIS — I2510 Atherosclerotic heart disease of native coronary artery without angina pectoris: Secondary | ICD-10-CM | POA: Insufficient documentation

## 2023-08-07 DIAGNOSIS — Z794 Long term (current) use of insulin: Secondary | ICD-10-CM | POA: Insufficient documentation

## 2023-08-07 DIAGNOSIS — I10 Essential (primary) hypertension: Secondary | ICD-10-CM | POA: Insufficient documentation

## 2023-08-07 DIAGNOSIS — E1142 Type 2 diabetes mellitus with diabetic polyneuropathy: Secondary | ICD-10-CM | POA: Diagnosis not present

## 2023-08-07 DIAGNOSIS — H2511 Age-related nuclear cataract, right eye: Secondary | ICD-10-CM | POA: Diagnosis not present

## 2023-08-07 DIAGNOSIS — J45909 Unspecified asthma, uncomplicated: Secondary | ICD-10-CM | POA: Diagnosis not present

## 2023-08-07 DIAGNOSIS — E1136 Type 2 diabetes mellitus with diabetic cataract: Secondary | ICD-10-CM | POA: Insufficient documentation

## 2023-08-07 DIAGNOSIS — G4730 Sleep apnea, unspecified: Secondary | ICD-10-CM | POA: Insufficient documentation

## 2023-08-07 HISTORY — DX: Obesity, unspecified: E66.9

## 2023-08-07 HISTORY — PX: CATARACT EXTRACTION W/PHACO: SHX586

## 2023-08-07 LAB — GLUCOSE, CAPILLARY: Glucose-Capillary: 125 mg/dL — ABNORMAL HIGH (ref 70–99)

## 2023-08-07 SURGERY — PHACOEMULSIFICATION, CATARACT, WITH IOL INSERTION
Anesthesia: Monitor Anesthesia Care | Site: Eye | Laterality: Right

## 2023-08-07 MED ORDER — ARMC OPHTHALMIC DILATING DROPS
1.0000 | OPHTHALMIC | Status: DC | PRN
  Administered 2023-08-07 (×3): 1 via OPHTHALMIC

## 2023-08-07 MED ORDER — MIDAZOLAM HCL 2 MG/2ML IJ SOLN
INTRAMUSCULAR | Status: DC | PRN
  Administered 2023-08-07 (×2): 1 mg via INTRAVENOUS

## 2023-08-07 MED ORDER — FENTANYL CITRATE (PF) 100 MCG/2ML IJ SOLN
INTRAMUSCULAR | Status: DC | PRN
  Administered 2023-08-07: 50 ug via INTRAVENOUS

## 2023-08-07 MED ORDER — MOXIFLOXACIN HCL 0.5 % OP SOLN
OPHTHALMIC | Status: DC | PRN
  Administered 2023-08-07: .2 mL via OPHTHALMIC

## 2023-08-07 MED ORDER — MIDAZOLAM HCL 2 MG/2ML IJ SOLN
INTRAMUSCULAR | Status: AC
  Filled 2023-08-07: qty 2

## 2023-08-07 MED ORDER — FENTANYL CITRATE (PF) 100 MCG/2ML IJ SOLN
INTRAMUSCULAR | Status: AC
  Filled 2023-08-07: qty 2

## 2023-08-07 MED ORDER — LIDOCAINE HCL (PF) 2 % IJ SOLN
INTRAOCULAR | Status: DC | PRN
  Administered 2023-08-07: 1 mL via INTRAOCULAR

## 2023-08-07 MED ORDER — SIGHTPATH DOSE#1 BSS IO SOLN
INTRAOCULAR | Status: DC | PRN
  Administered 2023-08-07: 15 mL

## 2023-08-07 MED ORDER — TETRACAINE HCL 0.5 % OP SOLN
1.0000 [drp] | OPHTHALMIC | Status: DC | PRN
  Administered 2023-08-07 (×3): 1 [drp] via OPHTHALMIC

## 2023-08-07 MED ORDER — SIGHTPATH DOSE#1 BSS IO SOLN
INTRAOCULAR | Status: DC | PRN
  Administered 2023-08-07: 138 mL via OPHTHALMIC

## 2023-08-07 MED ORDER — ARMC OPHTHALMIC DILATING DROPS
OPHTHALMIC | Status: AC
  Filled 2023-08-07: qty 0.5

## 2023-08-07 MED ORDER — SIGHTPATH DOSE#1 NA HYALUR & NA CHOND-NA HYALUR IO KIT
PACK | INTRAOCULAR | Status: DC | PRN
  Administered 2023-08-07: 1 via OPHTHALMIC

## 2023-08-07 MED ORDER — TETRACAINE HCL 0.5 % OP SOLN
OPHTHALMIC | Status: AC
  Filled 2023-08-07: qty 4

## 2023-08-07 SURGICAL SUPPLY — 18 items
CANNULA ANT/CHMB 27G (MISCELLANEOUS) IMPLANT
CANNULA ANT/CHMB 27GA (MISCELLANEOUS) IMPLANT
CATARACT SUITE SIGHTPATH (MISCELLANEOUS) ×1 IMPLANT
DISSECTOR HYDRO NUCLEUS 50X22 (MISCELLANEOUS) ×1 IMPLANT
FEE CATARACT SUITE SIGHTPATH (MISCELLANEOUS) ×1 IMPLANT
GLOVE PI ULTRA LF STRL 7.5 (GLOVE) ×1 IMPLANT
GLOVE SURG POLYISOPRENE 8.5 (GLOVE) ×1 IMPLANT
GLOVE SURG PROTEXIS BL SZ6.5 (GLOVE) ×1 IMPLANT
GLOVE SURG SYN 6.5 PF PI BL (GLOVE) ×1 IMPLANT
GLOVE SURG SYN 8.5 PF PI BL (GLOVE) ×1 IMPLANT
LENS IOL TECNIS EYHANCE 24.0 (Intraocular Lens) IMPLANT
NDL FILTER BLUNT 18X1 1/2 (NEEDLE) ×1 IMPLANT
NEEDLE FILTER BLUNT 18X1 1/2 (NEEDLE) ×1 IMPLANT
PACK VIT ANT 23G (MISCELLANEOUS) IMPLANT
RING MALYGIN (MISCELLANEOUS) IMPLANT
SUT ETHILON 10-0 CS-B-6CS-B-6 (SUTURE) IMPLANT
SUTURE EHLN 10-0 CS-B-6CS-B-6 (SUTURE) IMPLANT
SYR 3ML LL SCALE MARK (SYRINGE) ×1 IMPLANT

## 2023-08-07 NOTE — H&P (Signed)
 [facility]   Primary Care Physician:  Donnie Galea, MD Ophthalmologist: Dr. Dusty Gin  Pre-Procedure History & Physical: HPI:  Alexis [name] is a 73 y.o. female here for cataract surgery.   Past Medical History:  Diagnosis Date   Allergy    Asthma    in childhood   Benign essential tremor    Benign tumor of pelvic bones, sacrum, and coccyx    Blood in stool    Chest pain    a. 07/2017 MV: EF 63%, no ischemia/infarct. GI update artifact.   Diabetes mellitus    dx'd at age ~81   GERD (gastroesophageal reflux disease)    Hx of colonic polyps    Hyperlipidemia    Hypertension    dx'd at age ~64   Morbid obesity (HCC)    Obesity (BMI 30-39.9)    Sleep apnea    on CPAP   Tremor    only in face; worse under periods of stress. father with tremor also   Type 2 diabetes mellitus with diabetic neuropathy [facility])     Past Surgical History:  Procedure Laterality Date   ABDOMINAL HYSTERECTOMY  1988   Surgery included removal of benign pelvic tumor   CATARACT EXTRACTION W/PHACO Left 07/24/2023   Procedure: PHACOEMULSIFICATION, CATARACT, WITH IOL INSERTION 6.95 00:50.0;  Surgeon: Rosa College, MD;  Location: [facility];  Service: Ophthalmology;  Laterality: Left;   COLONOSCOPY  01/20/2017   LAPAROSCOPIC APPENDECTOMY N/A 10/30/2015   Procedure: APPENDECTOMY LAPAROSCOPIC, lysis of adhesions;  Surgeon: Kandis Ormond, MD;  Location: [facility] ORS;  Service: General;  Laterality: N/A;   OVARY BIOPSY  1988   pelvic bone tumor  1988   TONSILLECTOMY AND ADENOIDECTOMY  1957   UPPER GI ENDOSCOPY  01/20/2017    Prior to Admission medications   Medication Sig Start Date End Date Taking? Authorizing Provider  aspirin EC 81 MG tablet Take 81 mg by mouth daily.   Yes [provider]  cholecalciferol (VITAMIN D3) 25 MCG (1000 UNIT) tablet Take 1,000 Units by mouth daily.   Yes [provider]  cyanocobalamin  (VITAMIN B12) 1000 MCG tablet Take 1 tablet  (1,000 mcg total) by mouth daily. 07/14/22  Yes Donnie Galea, MD  enalapril  (VASOTEC ) 10 MG tablet TAKE 1 TABLET EVERY DAY 08/01/23  Yes Donnie Galea, MD  ezetimibe  (ZETIA ) 10 MG tablet TAKE 1 TABLET EVERY DAY 07/05/23  Yes Gollan, Timothy J, MD  gabapentin  (NEURONTIN ) 400 MG capsule TAKE 1 CAPSULE AT BEDTIME 07/22/22  Yes Donnie Galea, MD  loratadine  (CLARITIN ) 10 MG tablet Take 1 tablet (10 mg total) by mouth daily. 10/14/16  Yes Donnie Galea, MD  metFORMIN  (GLUCOPHAGE ) 1000 MG tablet TAKE 1 TABLET TWICE DAILY WITH MEALS (NEED MD APPOINTMENT FOR REFILLS) 07/03/23  Yes Donnie Galea, MD  nystatin  (MYCOSTATIN /NYSTOP ) powder Apply 1 application topically 3 (three) times daily. 11/05/19  Yes Donnie Galea, MD  omeprazole (PRILOSEC) 20 MG capsule Take 20 mg by mouth daily.   Yes [provider]  ondansetron  (ZOFRAN -ODT) 4 MG disintegrating tablet Take one tablet po q 12 prn nausea.   Yes [provider]  Probiotic Product (ALIGN) 4 MG CAPS Take 1 capsule by mouth daily.   Yes [provider]  propranolol  (INDERAL ) 20 MG tablet TAKE 1 TABLET TWICE DAILY 08/01/23  Yes Donnie Galea, MD  rosuvastatin  (CRESTOR ) 10 MG tablet TAKE 1/2 TABLET EVERY DAY 07/05/23  Yes Gollan, Timothy J,  MD  Alcohol Swabs (B-D SINGLE USE SWABS REGULAR) PADS Use as instructed to check blood sugar twice daily.  Diagnosis:  E11.9  Insulin  dependent. 11/12/19   Donnie Galea, MD  Blood Glucose Calibration (TRUE METRIX LEVEL 2) Normal SOLN Use as instructed to check blood sugar meter.  Diagnosis:  E11.9  Insulin  dependent 11/12/19   Donnie Galea, MD  Blood Glucose Monitoring Suppl (TRUE METRIX METER) w/Device KIT Use to check blood sugar twice daily.  Diagnosis:  E11.9  Insulin  dependent. 11/05/19   Donnie Galea, MD  DROPLET INSULIN  SYRINGE 30G X 5/16" 0.5 ML MISC USE TO INJECT INSULIN  TWICE DAILY AS DIRECTED 05/21/18   Donnie Galea, MD  glucose blood (TRUE METRIX BLOOD GLUCOSE TEST)  test strip Use as instructed to check blood sugar twice daily.  Diagnosis:  E11.9  Insulin  dependent. 11/05/19   Donnie Galea, MD  Insulin  Pen Needle (NOVOFINE PEN NEEDLE) 32G X 6 MM MISC Use as directed to inject Tresiba  06/27/23   Donnie Galea, MD  ipratropium (ATROVENT ) 0.06 % nasal spray Place 2 sprays into both nostrils 4 (four) times daily. 12/25/21   Kent Pear, NP  TRESIBA  FLEXTOUCH 100 UNIT/ML FlexTouch Pen Inject 55-60 Units into the skin daily. 06/26/23   Donnie Galea, MD  TRUEplus Lancets 33G MISC Use as instructed to check blood sugar twice daily.  Diagnosis:  E11.9  Insulin  dependent. 11/12/19   Donnie Galea, MD    Allergies as of 06/29/2023 - Review Complete 06/20/2023  Allergen Reaction Noted   Milk-related compounds  06/14/2019   Peanut-containing drug products  06/14/2019   Statins Other (See Comments) 01/27/2017    Family History  Problem Relation Age of Onset   Heart disease Mother        h/o valve replacement, no h/o MI   Colon cancer Father    Colon cancer Sister        metastatic (liver?) cancer   Uterine cancer Sister    Lung cancer Maternal Grandfather    Breast cancer Neg Hx     Social History   Socioeconomic History   Marital status: Widowed    Spouse name: Not on file   Number of children: Not on file   Years of education: Not on file   Highest education level: Not on file  Occupational History   Occupation: Payroll Group Lead    Employer: [employer]  Tobacco Use   Smoking status: Never   Smokeless tobacco: Never  Vaping Use   Vaping status: Never Used  Substance and Sexual Activity   Alcohol use: No   Drug use: No   Sexual activity: Not Currently  Other Topics Concern   Not on file  Social History Narrative   Widowed 2022, was married 1970 to [employer]      Education: 12th grade   Retired from [employer].   Enjoys playing piano (plays for church), time at church, time with grandkids   2 kids   Social Drivers of Health    Financial Resource Strain: Low Risk  (06/07/2023)   Overall Financial Resource Strain (CARDIA)    Difficulty of Paying Living Expenses: Not hard at all  Food Insecurity: No Food Insecurity (06/07/2023)   Hunger Vital Sign    Worried About Running Out of Food in the Last Year: Never true    Ran Out of Food in the Last Year: Never true  Transportation Needs: No Transportation Needs (06/07/2023)   PRAPARE -  Administrator, Civil Service (Medical): No    Lack of Transportation (Non-Medical): No  Physical Activity: Inactive (06/07/2023)   Exercise Vital Sign    Days of Exercise per Week: 0 days    Minutes of Exercise per Session: 0 min  Stress: No Stress Concern Present (06/07/2023)   [employer] of Occupational Health - Occupational Stress Questionnaire    Feeling of Stress : Only a little  Social Connections: Moderately Integrated (06/07/2023)   Social Connection and Isolation Panel [NHANES]    Frequency of Communication with Friends and Family: More than three times a week    Frequency of Social Gatherings with Friends and Family: More than three times a week    Attends Religious Services: More than 4 times per year    Active Member of [employer] or Organizations: Yes    Attends Banker Meetings: Never    Marital Status: Widowed  Intimate Partner Violence: Not At Risk (06/07/2023)   Humiliation, Afraid, Rape, and Kick questionnaire    Fear of Current or Ex-Partner: No    Emotionally Abused: No    Physically Abused: No    Sexually Abused: No    Review of Systems: See HPI, otherwise negative ROS  Physical Exam: BP 133/65   Temp (!) 97.3 F (36.3 C) (Temporal)   Resp 13   Ht 5' 2.99" (1.6 m)   Wt 92 kg   SpO2 95%   BMI 35.95 kg/m  General:   Alert, cooperative. Head:  Normocephalic and atraumatic. Respiratory:  Normal work of breathing. Cardiovascular:  NAD  Impression/Plan: Alexis [name] is here for cataract surgery.  Risks, benefits,  limitations, and alternatives regarding cataract surgery have been reviewed with the patient.  Questions have been answered.  All parties agreeable.   Dusty Gin, MD  08/07/2023, 10:14 AM

## 2023-08-07 NOTE — Op Note (Signed)
 OPERATIVE NOTE  Alexis [name] [id number] 08/07/2023   PREOPERATIVE DIAGNOSIS:  Nuclear sclerotic cataract right eye.  H25.11   POSTOPERATIVE DIAGNOSIS:    Nuclear sclerotic cataract right eye.     PROCEDURE:  Phacoemusification with posterior chamber intraocular lens placement of the right eye   LENS:   Implant Name Type Inv. Item Serial No. Manufacturer Lot No. LRB No. Used Action  LENS IOL TECNIS EYHANCE 24.0 - [id number] Intraocular Lens LENS IOL TECNIS EYHANCE 24.0 [id number] [employer]  Right 1 Implanted       Procedure(s) with comments: PHACOEMULSIFICATION, CATARACT, WITH IOL INSERTION (Right) - 6.99 1:10.3  SURGEON:  Dusty Gin, MD, MPH  ANESTHESIOLOGIST: Anesthesiologist: Baltazar Bonier, MD CRNA: Annamarie Kid, CRNA   ANESTHESIA:  Topical with tetracaine  drops augmented with 1% preservative-free intracameral lidocaine .  ESTIMATED BLOOD LOSS: less than 1 mL.   COMPLICATIONS:  None.   DESCRIPTION OF PROCEDURE:  The patient was identified in the holding room and transported to the operating room and placed in the supine position under the operating microscope.  The right eye was identified as the operative eye and it was prepped and draped in the usual sterile ophthalmic fashion.   A 1.0 millimeter clear-corneal paracentesis was made at the 10:30 position. 0.5 ml of preservative-free 1% lidocaine  with epinephrine  was injected into the anterior chamber.  The anterior chamber was filled with viscoelastic.  A 2.4 millimeter keratome was used to make a near-clear corneal incision at the 8:00 position.  A curvilinear capsulorrhexis was made with a cystotome and capsulorrhexis forceps.  Balanced salt  solution was used to hydrodissect and hydrodelineate the nucleus.   Phacoemulsification was then used in stop and chop fashion to remove the lens nucleus and epinucleus.  The remaining cortex was then removed using the irrigation and aspiration handpiece. Viscoelastic was  then placed into the capsular bag to distend it for lens placement.  A lens was then injected into the capsular bag.  The remaining viscoelastic was aspirated.   Wounds were hydrated with balanced salt  solution.  The anterior chamber was inflated to a physiologic pressure with balanced salt  solution.   Intracameral vigamox  0.1 mL undiluted was injected into the eye and a drop placed onto the ocular surface.  No wound leaks were noted.  The patient was taken to the recovery room in stable condition without complications of anesthesia or surgery  Dusty Gin 08/07/2023, 10:43 AM

## 2023-08-07 NOTE — Anesthesia Postprocedure Evaluation (Signed)
 Anesthesia Post Note  Patient: NAJLA AUGHENBAUGH  Procedure(s) Performed: PHACOEMULSIFICATION, CATARACT, WITH IOL INSERTION (Right: Eye)  Patient location during evaluation: PACU Anesthesia Type: MAC Level of consciousness: awake and alert Pain management: pain level controlled Vital Signs Assessment: post-procedure vital signs reviewed and stable Respiratory status: spontaneous breathing, nonlabored ventilation and respiratory function stable Cardiovascular status: stable and blood pressure returned to baseline Postop Assessment: no apparent nausea or vomiting Anesthetic complications: no   No notable events documented.   Last Vitals:  Vitals:   08/07/23 1045 08/07/23 1048  BP: (!) 115/52 (!) 115/55  Pulse: 66 64  Resp: (!) 9 15  Temp:  36.8 C  SpO2: 94% 95%    Last Pain:  Vitals:   08/07/23 1048  TempSrc:   PainSc: 0-No pain                 Baltazar Bonier

## 2023-08-07 NOTE — Transfer of Care (Signed)
 Immediate Anesthesia Transfer of Care Note  Patient: Alexis [name]  Procedure(s) Performed: PHACOEMULSIFICATION, CATARACT, WITH IOL INSERTION (Right: Eye)  Patient Location: PACU  Anesthesia Type: MAC  Level of Consciousness: awake, alert  and patient cooperative  Airway and Oxygen Therapy: Patient Spontanous Breathing and Patient connected to supplemental oxygen  Post-op Assessment: Post-op Vital signs reviewed, Patient's Cardiovascular Status Stable, Respiratory Function Stable, Patent Airway and No signs of Nausea or vomiting  Post-op Vital Signs: Reviewed and stable  Complications: No notable events documented.

## 2023-08-07 NOTE — Discharge Instructions (Signed)

## 2023-09-12 DIAGNOSIS — Z0100 Encounter for examination of eyes and vision without abnormal findings: Secondary | ICD-10-CM | POA: Diagnosis not present

## 2023-10-23 ENCOUNTER — Telehealth: Payer: Self-pay

## 2023-10-23 ENCOUNTER — Ambulatory Visit: Admitting: Family Medicine

## 2023-10-23 NOTE — Telephone Encounter (Signed)
 Sending to [facility] admin to reschedule.

## 2023-10-31 ENCOUNTER — Ambulatory Visit (INDEPENDENT_AMBULATORY_CARE_PROVIDER_SITE_OTHER): Admitting: Family Medicine

## 2023-10-31 VITALS — BP 138/72 | HR 62 | Temp 98.5°F | Ht 62.99 in | Wt 206.6 lb

## 2023-10-31 DIAGNOSIS — Z794 Long term (current) use of insulin: Secondary | ICD-10-CM

## 2023-10-31 DIAGNOSIS — E1140 Type 2 diabetes mellitus with diabetic neuropathy, unspecified: Secondary | ICD-10-CM

## 2023-10-31 DIAGNOSIS — R42 Dizziness and giddiness: Secondary | ICD-10-CM

## 2023-10-31 LAB — POCT GLYCOSYLATED HEMOGLOBIN (HGB A1C): Hemoglobin A1C: 7.7 % — AB (ref 4.0–5.6)

## 2023-10-31 MED ORDER — ROSUVASTATIN CALCIUM 10 MG PO TABS: 10.0000 mg | ORAL_TABLET | Freq: Every day | ORAL | 3 refills | Status: AC

## 2023-10-31 NOTE — Progress Notes (Unsigned)
 Diabetes:  Using medications without difficulties: yes Hypoglycemic episodes: rare lows, once down to 60s with inc activity and yard work.  Cautions d/w pt.   Hyperglycemic episodes:no Feet problems:no Blood Sugars averaging: some variability, esp around the time of cataract surgery, usually 80-100s in the AMs.   eye exam within last year:yes A1c d/w pt at OV.  7.7. She can tolerate crestor  10mg  a day.  Rx sent.   D/w pt about diet.   She had cataract surgery x2.  That helped.   Last Tuesday she had a dizzy spell with nausea. But hasn't experienced anything since. She said her blood sugar was normal at the time.  She took the trash out, was eating breakfast.  Had nausea and room spinning with head turning.  No syncope.  The felt worse laying down.  Ended up dry heaving, then vomited.  Then it passed.  No symptoms now or in the meantime.  She is still helping care for her aunt.    Meds, vitals, and allergies reviewed.   ROS: Per HPI unless specifically indicated in ROS section   GEN: nad, alert and oriented HEENT: ncat, TM wnl B.  NECK: supple w/o LA CV: rrr. PULM: ctab, no inc wob ABD: soft, +bs EXT: no edema SKIN: well perfused.

## 2023-10-31 NOTE — Patient Instructions (Addendum)
 Recheck in about 3-4 months.  A1c at the visit.  Update me as needed.  Update me if you have more trouble with vertigo.   Take care.  Glad to see you.

## 2023-11-01 DIAGNOSIS — R42 Dizziness and giddiness: Secondary | ICD-10-CM | POA: Insufficient documentation

## 2023-11-01 NOTE — Assessment & Plan Note (Signed)
 It sounds like she could have had BPV.  No symptoms now.  Anatomy discussed with patient.  Update me as needed if she has recurrent symptoms.

## 2023-11-01 NOTE — Assessment & Plan Note (Signed)
 A1c d/w pt at OV.  7.7. She can tolerate crestor  10mg  a day.  Rx sent.   D/w pt about diet.  Recheck in about 3-4 months.  A1c at the visit.  Update me as needed.

## 2023-12-31 ENCOUNTER — Ambulatory Visit
Admission: EM | Admit: 2023-12-31 | Discharge: 2023-12-31 | Disposition: A | Discharge status: Home / Self Care | Attending: Emergency Medicine | Admitting: Emergency Medicine

## 2023-12-31 ENCOUNTER — Ambulatory Visit: Payer: Self-pay | Admitting: Emergency Medicine

## 2023-12-31 ENCOUNTER — Encounter: Payer: Self-pay | Admitting: Emergency Medicine

## 2023-12-31 DIAGNOSIS — U071 COVID-19: Secondary | ICD-10-CM | POA: Insufficient documentation

## 2023-12-31 LAB — SARS CORONAVIRUS 2 BY RT PCR: SARS Coronavirus 2 by RT PCR: POSITIVE — AB

## 2023-12-31 MED ORDER — IPRATROPIUM BROMIDE 0.06 % NA SOLN: 2.0000 | Freq: Four times a day (QID) | NASAL | 0 refills | Status: AC

## 2023-12-31 MED ORDER — NIRMATRELVIR/RITONAVIR (PAXLOVID)TABLET: 3.0000 | ORAL_TABLET | Freq: Two times a day (BID) | ORAL | 0 refills | Status: AC

## 2023-12-31 NOTE — ED Provider Notes (Signed)
 HPI  SUBJECTIVE:  Alexis [name] is a 73 y.o. female who presents with nasal congestion, clear rhinorrhea, mildly productive cough for the past 4 days.  She reports postnasal drip, sore throat from the postnasal drip.  She reports fevers Tmax 101.5 with accompanying fevers only when febrile starting yesterday.  She had a positive home COVID test yesterday.  No body aches, sinus pain or pressure, wheezing or chest pain or shortness of breath, nausea or vomiting, diarrhea, or abdominal pain.  She had 2 doses of the COVID-vaccine.  No antipyretic in the past 6 hours.  She has been taking cough drops which has been working well to control cough, increasing fluid intake and eating soup with improvement.  Negative factors. Past medical history of diabetes, asthma as a child, hypertension, BMI above 30.  PCP: In [place].  Past Medical History:  Diagnosis Date   Allergy    Asthma    in childhood   Benign essential tremor    Benign tumor of pelvic bones, sacrum, and coccyx    Blood in stool    Chest pain    a. 07/2017 MV: EF 63%, no ischemia/infarct. GI update artifact.   Diabetes mellitus    dx'd at age ~68   GERD (gastroesophageal reflux disease)    Hx of colonic polyps    Hyperlipidemia    Hypertension    dx'd at age ~81   Morbid obesity (HCC)    Obesity (BMI 30-39.9)    Sleep apnea    on CPAP   Tremor    only in face; worse under periods of stress. father with tremor also   Type 2 diabetes mellitus with diabetic neuropathy [facility])     Past Surgical History:  Procedure Laterality Date   ABDOMINAL HYSTERECTOMY  1988   Surgery included removal of benign pelvic tumor   CATARACT EXTRACTION W/PHACO Left 07/24/2023   Procedure: PHACOEMULSIFICATION, CATARACT, WITH IOL INSERTION 6.95 00:50.0;  Surgeon: Myrna Adine Anes, MD;  Location: [facility];  Service: Ophthalmology;  Laterality: Left;   CATARACT EXTRACTION W/PHACO Right 08/07/2023   Procedure: PHACOEMULSIFICATION, CATARACT, WITH  IOL INSERTION;  Surgeon: Myrna Adine Anes, MD;  Location: [facility];  Service: Ophthalmology;  Laterality: Right;  6.99 1:10.3   COLONOSCOPY  01/20/2017   LAPAROSCOPIC APPENDECTOMY N/A 10/30/2015   Procedure: APPENDECTOMY LAPAROSCOPIC, lysis of adhesions;  Surgeon: Dorothyann LITTIE Husk, MD;  Location: [facility] ORS;  Service: General;  Laterality: N/A;   OVARY BIOPSY  1988   pelvic bone tumor  1988   TONSILLECTOMY AND ADENOIDECTOMY  1957   UPPER GI ENDOSCOPY  01/20/2017    Family History  Problem Relation Age of Onset   Heart disease Mother        h/o valve replacement, no h/o MI   Colon cancer Father    Colon cancer Sister        metastatic (liver?) cancer   Uterine cancer Sister    Lung cancer Maternal Grandfather    Breast cancer Neg Hx     Social History   Tobacco Use   Smoking status: Never   Smokeless tobacco: Never  Vaping Use   Vaping status: Never Used  Substance Use Topics   Alcohol use: No   Drug use: No    No current facility-administered medications for this encounter.  Current Outpatient Medications:    ipratropium (ATROVENT ) 0.06 % nasal spray, Place 2 sprays into both nostrils 4 (four) times daily., Disp: 15 mL, Rfl: 0  nirmatrelvir /ritonavir  (PAXLOVID ) 20 x 150 MG & 10 x 100MG  TABS, Take 3 tablets by mouth 2 (two) times daily for 5 days. Patient GFR is 86. Take nirmatrelvir  (150 mg) two tablets twice daily for 5 days and ritonavir  (100 mg) one tablet twice daily for 5 days., Disp: 30 tablet, Rfl: 0   Alcohol Swabs (B-D SINGLE USE SWABS REGULAR) PADS, Use as instructed to check blood sugar twice daily.  Diagnosis:  E11.9  Insulin  dependent., Disp: 200 each, Rfl: 5   aspirin EC 81 MG tablet, Take 81 mg by mouth daily., Disp: , Rfl:    Blood Glucose Calibration (TRUE METRIX LEVEL 2) Normal SOLN, Use as instructed to check blood sugar meter.  Diagnosis:  E11.9  Insulin  dependent, Disp: 1 each, Rfl: 3   Blood Glucose Monitoring Suppl (TRUE METRIX METER)  w/Device KIT, Use to check blood sugar twice daily.  Diagnosis:  E11.9  Insulin  dependent., Disp: 1 kit, Rfl: 0   cholecalciferol (VITAMIN D3) 25 MCG (1000 UNIT) tablet, Take 1,000 Units by mouth daily., Disp: , Rfl:    cyanocobalamin  (VITAMIN B12) 1000 MCG tablet, Take 1 tablet (1,000 mcg total) by mouth daily., Disp: , Rfl:    DROPLET INSULIN  SYRINGE 30G X 5/16 0.5 ML MISC, USE TO INJECT INSULIN  TWICE DAILY AS DIRECTED, Disp: 200 each, Rfl: 2   enalapril  (VASOTEC ) 10 MG tablet, TAKE 1 TABLET EVERY DAY, Disp: 90 tablet, Rfl: 3   ezetimibe  (ZETIA ) 10 MG tablet, TAKE 1 TABLET EVERY DAY, Disp: 90 tablet, Rfl: 3   gabapentin  (NEURONTIN ) 400 MG capsule, TAKE 1 CAPSULE AT BEDTIME, Disp: 90 capsule, Rfl: 3   glucose blood (TRUE METRIX BLOOD GLUCOSE TEST) test strip, Use as instructed to check blood sugar twice daily.  Diagnosis:  E11.9  Insulin  dependent., Disp: 200 each, Rfl: 3   Insulin  Pen Needle (NOVOFINE PEN NEEDLE) 32G X 6 MM MISC, Use as directed to inject Tresiba , Disp: 100 each, Rfl: 3   [Paused] loratadine  (CLARITIN ) 10 MG tablet, Take 1 tablet (10 mg total) by mouth daily., Disp: , Rfl:    metFORMIN  (GLUCOPHAGE ) 1000 MG tablet, TAKE 1 TABLET TWICE DAILY WITH MEALS (NEED MD APPOINTMENT FOR REFILLS), Disp: 180 tablet, Rfl: 3   nystatin  (MYCOSTATIN /NYSTOP ) powder, Apply 1 application topically 3 (three) times daily., Disp: 30 g, Rfl: 1   omeprazole (PRILOSEC) 20 MG capsule, Take 20 mg by mouth daily., Disp: , Rfl:    ondansetron  (ZOFRAN -ODT) 4 MG disintegrating tablet, Take one tablet po q 12 prn nausea., Disp: , Rfl:    Probiotic Product (ALIGN) 4 MG CAPS, Take 1 capsule by mouth daily., Disp: , Rfl:    propranolol  (INDERAL ) 20 MG tablet, TAKE 1 TABLET TWICE DAILY, Disp: 180 tablet, Rfl: 3   [Paused] rosuvastatin  (CRESTOR ) 10 MG tablet, Take 1 tablet (10 mg total) by mouth daily., Disp: 90 tablet, Rfl: 3   TRESIBA  FLEXTOUCH 100 UNIT/ML FlexTouch Pen, Inject 55-60 Units into the skin daily.,  Disp: 30 mL, Rfl: 5   TRUEplus Lancets 33G MISC, Use as instructed to check blood sugar twice daily.  Diagnosis:  E11.9  Insulin  dependent., Disp: 200 each, Rfl: 3  Allergies  Allergen Reactions   Milk-Related Compounds     Rash/rhinorrhea.    Peanut-Containing Drug Products     rash   Statins Other (See Comments)    Myalgia- can tolerate crestor  10mg      ROS  As noted in HPI.   Physical Exam  BP (!) 151/71 (BP Location:  Right Arm)   Pulse 73   Temp 99.1 F (37.3 C) (Oral)   Resp 15   Ht 5' 2 (1.575 m)   Wt 94.2 kg   SpO2 95%   BMI 37.98 kg/m   Constitutional: Well developed, well nourished, no acute distress Eyes: PERRL, EOMI, conjunctiva normal bilaterally HENT: Normocephalic, atraumatic,mucus membranes moist.  Positive nasal congestion.  No maxillary, frontal sinus tenderness.  Tonsils surgically absent.  No postnasal drip. Neck: No cervical lymphadenopathy Respiratory: Clear to auscultation bilaterally, no rales, no wheezing, no rhonchi Cardiovascular: Normal rate and rhythm, no murmurs, no gallops, no rubs GI: nondistended skin: No rash, skin intact Musculoskeletal: no deformities Neurologic: Alert & oriented x 3, CN III-XII grossly intact, no motor deficits, sensation grossly intact Psychiatric: Speech and behavior appropriate   ED Course   Medications - No data to display  Orders Placed This Encounter  Procedures   SARS Coronavirus 2 by RT PCR (hospital order, performed in [facility] hospital lab) *cepheid single result test* Anterior Nasal Swab    Standing Status:   Standing    Number of Occurrences:   1   Results for orders placed or performed during the hospital encounter of 12/31/23 (from the past 24 hours)  SARS Coronavirus 2 by RT PCR (hospital order, performed in [facility] hospital lab) *cepheid single result test* Anterior Nasal Swab     Status: Abnormal   Collection Time: 12/31/23  8:14 AM   Specimen: Anterior Nasal Swab  Result Value Ref  Range   SARS Coronavirus 2 by RT PCR POSITIVE (A) NEGATIVE   No results found.  ED Clinical Impression  1. COVID-19 virus infection      ED Assessment/Plan     Previous records reviewed.  GFR 86.35 from labs done in February 2025  Patient qualifies for antivirals based on age, BMI above 30, diabetes and hypertension history of asthma.  We discussed antivirals and she would like to proceed with them stating that she tolerated Paxlovid  well before.  Home with Atrovent , saline nasal irrigation, Mucinex, Tylenol /ibuprofen.  Declined a prescription of cough syrup/Tessalon .  Will have her hold the Crestor  while taking the Paxlovid .  Discussed this with patient while in department  COVID-positive.  I tried to call patient, left voicemail for her to call us  back at 0848.  Will have staff reach out to her at some point today again.  Discussed labs, MDM, treatment plan, and plan for follow-up with patient Discussed sn/sx that should prompt return to the ED. patient agrees with plan.   Meds ordered this encounter  Medications   nirmatrelvir /ritonavir  (PAXLOVID ) 20 x 150 MG & 10 x 100MG  TABS    Sig: Take 3 tablets by mouth 2 (two) times daily for 5 days. Patient GFR is 86. Take nirmatrelvir  (150 mg) two tablets twice daily for 5 days and ritonavir  (100 mg) one tablet twice daily for 5 days.    Dispense:  30 tablet    Refill:  0   ipratropium (ATROVENT ) 0.06 % nasal spray    Sig: Place 2 sprays into both nostrils 4 (four) times daily.    Dispense:  15 mL    Refill:  0      *This clinic note was created using Scientist, clinical (histocompatibility and immunogenetics). Therefore, there may be occasional mistakes despite careful proofreading. ?    Van Knee, MD 12/31/23 [phone]

## 2023-12-31 NOTE — ED Triage Notes (Signed)
 Patient c/o cough, runny nose, and congestion that started on Wed.  Patient states that her fever started yesterday.  Patient took a home COVID test yesterday and was positive.

## 2023-12-31 NOTE — Discharge Instructions (Addendum)
 We will contact you if your COVID test comes back positive, but I anticipate that It will be positive.   I have prescribed Paxlovid .  Go to [email or web address] and enter in your information, and you will get the Paxlovid  at a significantly reduced price, if not for free.  Go to the ER for difficulty breathing, if you get worse, or further concerns.   Saline nasal irrigation with a NeilMed sinus rinse and distilled water as often as you want, plain Mucinex, continue cough drops.  Atrovent  nasal spray for the nasal congestion and postnasal drip.  200-400 mg of ibuprofen, 1000 mg Tylenol  3-4 times a day as needed for pain.

## 2024-01-01 ENCOUNTER — Telehealth: Payer: Self-pay

## 2024-01-01 NOTE — Telephone Encounter (Signed)
 SABRA

## 2024-01-01 NOTE — Telephone Encounter (Signed)
 Noted. Thanks.

## 2024-01-01 NOTE — Telephone Encounter (Signed)
 Per chart review tab pt pt was seen [facility] at [facility].  Sending note to DR Cleatus

## 2024-02-01 ENCOUNTER — Encounter: Payer: Self-pay | Admitting: Family Medicine

## 2024-02-01 ENCOUNTER — Ambulatory Visit: Admitting: Family Medicine

## 2024-02-01 VITALS — BP 138/78 | HR 63 | Temp 98.9°F | Ht 62.0 in | Wt 206.6 lb

## 2024-02-01 DIAGNOSIS — Z794 Long term (current) use of insulin: Secondary | ICD-10-CM

## 2024-02-01 DIAGNOSIS — E1140 Type 2 diabetes mellitus with diabetic neuropathy, unspecified: Secondary | ICD-10-CM

## 2024-02-01 DIAGNOSIS — M25569 Pain in unspecified knee: Secondary | ICD-10-CM

## 2024-02-01 DIAGNOSIS — Z23 Encounter for immunization: Secondary | ICD-10-CM | POA: Diagnosis not present

## 2024-02-01 LAB — POCT GLYCOSYLATED HEMOGLOBIN (HGB A1C): Hemoglobin A1C: 8.1 % — AB (ref 4.0–5.6)

## 2024-02-01 MED ORDER — GABAPENTIN 400 MG PO CAPS: 400.0000 mg | ORAL_CAPSULE | Freq: Every day | ORAL | 3 refills | Status: AC

## 2024-02-01 NOTE — Patient Instructions (Signed)
 Recheck at a yearly visit in about 4 months, labs ahead of time.  Take care.  Glad to see you. Update me as needed.

## 2024-02-01 NOTE — Progress Notes (Signed)
 Diabetes:  Using medications without difficulties: yes Hypoglycemic episodes: rare lows but had an event recently.  Usually <100 in the AMs at baseline.  One episode at church, had been checked down to 50.  Corrected with snack.  Cautions d/w pt.   Hyperglycemic episodes:no Feet problems: some tingling at baseline.  Blood Sugars averaging: see above.   eye exam within last year: yes A1c higher at 8.1.  d/w pt.   Gabapentin  helps with knee pain.  No ADE on med.    She had covid this year.  She got better in the meantime.  Breathing is back to baseline. Still with some cough but improving.  I presume that her illness affected her sugar.  Discussed.  She is still active at church, d/w pt about social interaction.   Meds, vitals, and allergies reviewed.   ROS: Per HPI unless specifically indicated in ROS section   GEN: nad, alert and oriented HEENT: ncat NECK: supple w/o LA CV: rrr. PULM: ctab, no inc wob ABD: soft, +bs EXT: no edema SKIN: well perfused.   Diabetic foot exam: Normal inspection No skin breakdown No calluses  Normal DP pulses Normal sensation to light touch and monofilament L 1st nail thickened.  Offered podiatry, she'll consider.

## 2024-02-04 NOTE — Assessment & Plan Note (Signed)
Continue gabapentin.  Update me as needed.

## 2024-02-04 NOTE — Assessment & Plan Note (Signed)
 A1c higher at 8.1.  d/w pt.  Recheck at a yearly visit in about 4 months, labs ahead of time.  No change in meds at this point, not increasing her medication because I do not want to induce hypoglycemia.  Cautions discussed with patient.  Continue insulin  and metformin  as is for now but she can cut back on insulin  if she has recurrent hypoglycemia.

## 2024-02-23 NOTE — Progress Notes (Signed)
 Cardiology Office Note  Date:  02/26/2024   ID:  Miles, Borkowski 03/02/1951, MRN [id number]  PCP:  Cleatus Arlyss RAMAN, MD   Chief Complaint  Patient presents with   Follow-up    12 month follow up /  pt has been doing well with no complaints of chest pain, chest pressure or SOB, medciation reviewed verbally with patient. Edema noted to bilateral ext.     HPI:  Ms Mykenzi Vanzile is a pleasant 73 year old woman with past medical history of HTN hyperlipidemia Diabetes 2 on insulin , HBA1C in the 8 range Significant aortic atherosclerosis extending through her iliac arteries including aorta and coronaries Presenting for follow-up of her chest pain, PAD, aortic atherosclerosis  LOV 4/24 Lost husband 3 yrs ago Husband passed 03/06/21, renal failure, pulmonary fibrosis  Has daughter and son in town Goes to church Has met a gentleman at [employer], [place], they are social Getting over covid sept 2025  Active in the yard No regular exercise program  Tolerating Crestor  10 mg daily, zetia  10 daily,  Enalapril  10 mg daily, propranolol  BID  Lab work reviewed A1c 8.1 Total cholesterol 86 LDL 35  EKG personally reviewed by myself on todays visit EKG Interpretation Date/Time:  Monday February 26 2024 09:42:26 EST Ventricular Rate:  65 PR Interval:  190 QRS Duration:  74 QT Interval:  390 QTC Calculation: 405 R Axis:   39  Text Interpretation: Normal sinus rhythm Low voltage QRS No previous ECGs available Confirmed by Perla Lye [id number]) on 02/26/2024 10:03:49 AM    Other past medical history reviewed Stress test 07/2017  Pharmacological myocardial perfusion imaging study with no significant ischemia Non-attenuation corrected images with breast attenuation artifact and GI uptake artifact  GI uptake artifact noted on attenuation corrected images, making it difficult to interpret perfusion in the inferior wall.  Likely normal Normal wall motion, EF estimated at 63% No EKG changes  concerning for ischemia at peak stress or in recovery. Low risk scan  Family hx Mother with valve disease, 48, lived until 66, atrial fib Father: sepsis, aspiration  PMH:   has a past medical history of Allergy, Asthma, Benign essential tremor, Benign tumor of pelvic bones, sacrum, and coccyx, Blood in stool, Chest pain, Diabetes mellitus, GERD (gastroesophageal reflux disease), colonic polyps, Hyperlipidemia, Hypertension, Morbid obesity (HCC), Obesity (BMI 30-39.9), Sleep apnea, Tremor, and Type 2 diabetes mellitus with diabetic neuropathy (HCC).  PSH:    Past Surgical History:  Procedure Laterality Date   ABDOMINAL HYSTERECTOMY  1988   Surgery included removal of benign pelvic tumor   CATARACT EXTRACTION W/PHACO Left 07/24/2023   Procedure: PHACOEMULSIFICATION, CATARACT, WITH IOL INSERTION 6.95 00:50.0;  Surgeon: Myrna Adine Anes, MD;  Location: [facility];  Service: Ophthalmology;  Laterality: Left;   CATARACT EXTRACTION W/PHACO Right 08/07/2023   Procedure: PHACOEMULSIFICATION, CATARACT, WITH IOL INSERTION;  Surgeon: Myrna Adine Anes, MD;  Location: [facility];  Service: Ophthalmology;  Laterality: Right;  6.99 1:10.3   COLONOSCOPY  01/20/2017   LAPAROSCOPIC APPENDECTOMY N/A 10/30/2015   Procedure: APPENDECTOMY LAPAROSCOPIC, lysis of adhesions;  Surgeon: Dorothyann LITTIE Husk, MD;  Location: [facility];  Service: General;  Laterality: N/A;   OVARY BIOPSY  1988   pelvic bone tumor  1988   TONSILLECTOMY AND ADENOIDECTOMY  1957   UPPER GI ENDOSCOPY  01/20/2017    Current Outpatient Medications  Medication Sig Dispense Refill   Alcohol Swabs (B-D SINGLE USE SWABS REGULAR) PADS Use as instructed to check blood sugar twice daily.  Diagnosis:  E11.9  Insulin  dependent. 200 each 5   aspirin EC 81 MG tablet Take 81 mg by mouth daily.     Blood Glucose Calibration (TRUE METRIX LEVEL 2) Normal SOLN Use as instructed to check blood sugar meter.  Diagnosis:  E11.9  Insulin   dependent 1 each 3   Blood Glucose Monitoring Suppl (TRUE METRIX METER) w/Device KIT Use to check blood sugar twice daily.  Diagnosis:  E11.9  Insulin  dependent. 1 kit 0   cholecalciferol (VITAMIN D3) 25 MCG (1000 UNIT) tablet Take 1,000 Units by mouth daily.     cyanocobalamin  (VITAMIN B12) 1000 MCG tablet Take 1 tablet (1,000 mcg total) by mouth daily.     DROPLET INSULIN  SYRINGE 30G X 5/16 0.5 ML MISC USE TO INJECT INSULIN  TWICE DAILY AS DIRECTED 200 each 2   enalapril  (VASOTEC ) 10 MG tablet TAKE 1 TABLET EVERY DAY 90 tablet 3   ezetimibe  (ZETIA ) 10 MG tablet TAKE 1 TABLET EVERY DAY 90 tablet 3   gabapentin  (NEURONTIN ) 400 MG capsule Take 1 capsule (400 mg total) by mouth at bedtime. 90 capsule 3   glucose blood (TRUE METRIX BLOOD GLUCOSE TEST) test strip Use as instructed to check blood sugar twice daily.  Diagnosis:  E11.9  Insulin  dependent. 200 each 3   Insulin  Pen Needle (NOVOFINE PEN NEEDLE) 32G X 6 MM MISC Use as directed to inject Tresiba  100 each 3   ipratropium (ATROVENT ) 0.06 % nasal spray Place 2 sprays into both nostrils 4 (four) times daily. 15 mL 0   loratadine  (CLARITIN ) 10 MG tablet Take 1 tablet (10 mg total) by mouth daily.     metFORMIN  (GLUCOPHAGE ) 1000 MG tablet TAKE 1 TABLET TWICE DAILY WITH MEALS (NEED MD APPOINTMENT FOR REFILLS) 180 tablet 3   nystatin  (MYCOSTATIN /NYSTOP ) powder Apply 1 application topically 3 (three) times daily. 30 g 1   omeprazole (PRILOSEC) 20 MG capsule Take 20 mg by mouth daily.     ondansetron  (ZOFRAN -ODT) 4 MG disintegrating tablet Take one tablet po q 12 prn nausea.     Probiotic Product (ALIGN) 4 MG CAPS Take 1 capsule by mouth daily.     propranolol  (INDERAL ) 20 MG tablet TAKE 1 TABLET TWICE DAILY 180 tablet 3   rosuvastatin  (CRESTOR ) 10 MG tablet Take 1 tablet (10 mg total) by mouth daily. 90 tablet 3   TRESIBA  FLEXTOUCH 100 UNIT/ML FlexTouch Pen Inject 55-60 Units into the skin daily. 30 mL 5   TRUEplus Lancets 33G MISC Use as instructed  to check blood sugar twice daily.  Diagnosis:  E11.9  Insulin  dependent. 200 each 3   No current facility-administered medications for this visit.    Allergies:   Milk-related compounds, Peanut-containing drug products, and Statins   Social History:  The patient  reports that she has never smoked. She has never used smokeless tobacco. She reports that she does not drink alcohol and does not use drugs.   Family History:   family history includes Colon cancer in her father and sister; Heart disease in her mother; Lung cancer in her maternal grandfather; Uterine cancer in her sister.   Review of Systems: Review of Systems  Constitutional: Negative.   HENT: Negative.    Respiratory: Negative.    Cardiovascular: Negative.   Gastrointestinal: Negative.   Musculoskeletal: Negative.   Neurological: Negative.   Psychiatric/Behavioral: Negative.    All other systems reviewed and are negative.  PHYSICAL EXAM: VS:  BP 126/72 (BP Location: Left Arm, Patient Position: Sitting, Cuff  Size: Normal)   Pulse 65   Ht 5' 3 (1.6 m)   Wt 207 lb 6.4 oz (94.1 kg)   SpO2 97%   BMI 36.74 kg/m  , BMI Body mass index is 36.74 kg/m. Constitutional:  oriented to person, place, and time. No distress.  HENT:  Head: Grossly normal Eyes:  no discharge. No scleral icterus.  Neck: No JVD, no carotid bruits  Cardiovascular: Regular rate and rhythm, no murmurs appreciated, trace lower extremity swelling, nonpitting Pulmonary/Chest: Clear to auscultation bilaterally, no wheezes or rails Abdominal: Soft.  no distension.  no tenderness.  Musculoskeletal: Normal range of motion Neurological:  normal muscle tone. Coordination normal. No atrophy Skin: Skin warm and dry Psychiatric: normal affect, pleasant  Recent Labs: 06/12/2023: ALT 9; BUN 14; Creatinine, Ser 0.69; Hemoglobin 14.3; Platelets 290.0; Potassium 4.4; Sodium 141    Lipid Panel Lab Results  Component Value Date   CHOL 86 06/12/2023   HDL 29.70  (L) 06/12/2023   LDLCALC 35 06/12/2023   TRIG 106.0 06/12/2023      Wt Readings from Last 3 Encounters:  02/26/24 207 lb 6.4 oz (94.1 kg)  02/01/24 206 lb 9.6 oz (93.7 kg)  12/31/23 207 lb 10.8 oz (94.2 kg)     ASSESSMENT AND PLAN:  Type 2 diabetes mellitus with other circulatory complication, with long-term current use of insulin  (HCC) CT scan with significant coronary calcification and aortic atherosclerosis extending through her iliac arteries Cholesterol at goal stressed the importance of aggressive diabetes control  Exertional chest pain -CAD with stable angina Currently with no symptoms of angina. No further workup at this time. Continue current medication regimen.  Aortic atherosclerosis (HCC) Significant aortic atherosclerosis extending through her iliac arteries including aorta and coronaries Diabetes remains an issue, A1c  8 range Cholesterol at goal   shortness of breath Encouraged walking program  Mixed hyperlipidemia Cholesterol is at goal on the current lipid regimen. No changes to the medications were made.  Leg swelling Trace nonpitting, likely venous issue.  May benefit from Lasix as needed if symptoms get worse   Orders Placed This Encounter  Procedures   EKG 12-Lead    Signed, Velinda Lunger, M.D., Ph.D. 02/26/2024  [facility] [place], [place] [phone]

## 2024-02-26 ENCOUNTER — Encounter: Payer: Self-pay | Admitting: Cardiovascular Disease

## 2024-02-26 ENCOUNTER — Ambulatory Visit: Attending: Cardiovascular Disease | Admitting: Cardiovascular Disease

## 2024-02-26 VITALS — BP 126/72 | HR 65 | Ht 63.0 in | Wt 207.4 lb

## 2024-02-26 DIAGNOSIS — M791 Myalgia, unspecified site: Secondary | ICD-10-CM

## 2024-02-26 DIAGNOSIS — I739 Peripheral vascular disease, unspecified: Secondary | ICD-10-CM | POA: Diagnosis not present

## 2024-02-26 DIAGNOSIS — Z794 Long term (current) use of insulin: Secondary | ICD-10-CM | POA: Diagnosis not present

## 2024-02-26 DIAGNOSIS — I700 Atherosclerosis of aorta: Secondary | ICD-10-CM | POA: Diagnosis not present

## 2024-02-26 DIAGNOSIS — T466X5S Adverse effect of antihyperlipidemic and antiarteriosclerotic drugs, sequela: Secondary | ICD-10-CM | POA: Diagnosis not present

## 2024-02-26 DIAGNOSIS — E1140 Type 2 diabetes mellitus with diabetic neuropathy, unspecified: Secondary | ICD-10-CM

## 2024-02-26 DIAGNOSIS — I2510 Atherosclerotic heart disease of native coronary artery without angina pectoris: Secondary | ICD-10-CM

## 2024-02-26 DIAGNOSIS — E782 Mixed hyperlipidemia: Secondary | ICD-10-CM

## 2024-02-26 DIAGNOSIS — I10 Essential (primary) hypertension: Secondary | ICD-10-CM

## 2024-02-26 MED ORDER — EZETIMIBE 10 MG PO TABS: 10.0000 mg | ORAL_TABLET | Freq: Every day | ORAL | 3 refills | Status: AC

## 2024-02-26 NOTE — Patient Instructions (Signed)

## 2024-03-08 DIAGNOSIS — E113293 Type 2 diabetes mellitus with mild nonproliferative diabetic retinopathy without macular edema, bilateral: Secondary | ICD-10-CM | POA: Diagnosis not present

## 2024-03-08 DIAGNOSIS — H02831 Dermatochalasis of right upper eyelid: Secondary | ICD-10-CM | POA: Diagnosis not present

## 2024-03-08 DIAGNOSIS — Z961 Presence of intraocular lens: Secondary | ICD-10-CM | POA: Diagnosis not present

## 2024-03-08 DIAGNOSIS — H40019 Open angle with borderline findings, low risk, unspecified eye: Secondary | ICD-10-CM | POA: Diagnosis not present

## 2024-03-08 LAB — OPHTHALMOLOGY REPORT-SCANNED

## 2024-03-13 ENCOUNTER — Telehealth: Payer: Self-pay | Admitting: Family Medicine

## 2024-03-13 NOTE — Telephone Encounter (Signed)
 Spoke with patient advised to speak to an insurance specialist who can best advise what plans would be in network and best for her.     Copied from CRM #[id number]. Topic: General - Other >> Mar 13, 2024 10:56 AM Alexis [name] wrote: Reason for CRM: Patient got a letter from [facility] stating that they will no longer be in-network with [facility]. Patient would like to discuss this with someone in and office and any next steps.

## 2024-05-13 ENCOUNTER — Other Ambulatory Visit: Payer: Self-pay | Admitting: Family Medicine

## 2024-05-13 NOTE — Telephone Encounter (Signed)
 [place]  Last filled:  02/29/24, #30 mL Last OV:  02/01/24, 3 mo DM f/u Next OV:  06/20/24, annual exam

## 2024-06-13 ENCOUNTER — Other Ambulatory Visit

## 2024-06-20 ENCOUNTER — Encounter: Admitting: Family Medicine
